# Patient Record
Sex: Male | Born: 1940 | Race: White | Hispanic: No | Marital: Single | Smoking: Never smoker
Health system: Southern US, Community
[De-identification: ages and names within clinical notes are randomized; demographics above are authoritative.]

## PROBLEM LIST (undated history)

## (undated) DIAGNOSIS — E785 Hyperlipidemia, unspecified: Secondary | ICD-10-CM

## (undated) DIAGNOSIS — G5600 Carpal tunnel syndrome, unspecified upper limb: Secondary | ICD-10-CM

## (undated) DIAGNOSIS — Z9889 Other specified postprocedural states: Secondary | ICD-10-CM

## (undated) DIAGNOSIS — G119 Hereditary ataxia, unspecified: Secondary | ICD-10-CM

## (undated) DIAGNOSIS — G4733 Obstructive sleep apnea (adult) (pediatric): Secondary | ICD-10-CM

## (undated) HISTORY — DX: Hereditary ataxia, unspecified: G11.9

## (undated) HISTORY — PX: BYPASS GRAFT AORTA TO AORTA: SHX6294

## (undated) HISTORY — PX: EYE SURGERY: SHX253

## (undated) HISTORY — DX: Hyperlipidemia, unspecified: E78.5

## (undated) HISTORY — PX: VASECTOMY: SHX75

## (undated) HISTORY — DX: Carpal tunnel syndrome, unspecified upper limb: G56.00

## (undated) HISTORY — DX: Obstructive sleep apnea (adult) (pediatric): G47.33

## (undated) HISTORY — PX: SHOULDER SURGERY: SHX246

## (undated) HISTORY — DX: Other specified postprocedural states: Z98.890

## (undated) HISTORY — PX: CARPAL TUNNEL RELEASE: SHX101

## (undated) HISTORY — PX: TONSILLECTOMY: SHX5217

---

## 2003-06-30 ENCOUNTER — Encounter: Admission: RE | Admit: 2003-06-30 | Discharge: 2003-06-30 | Payer: Self-pay | Admitting: Family Medicine

## 2003-07-18 ENCOUNTER — Ambulatory Visit (HOSPITAL_COMMUNITY): Admission: RE | Admit: 2003-07-18 | Discharge: 2003-07-18 | Payer: Self-pay | Admitting: Orthopedic Surgery

## 2003-07-18 ENCOUNTER — Ambulatory Visit (HOSPITAL_BASED_OUTPATIENT_CLINIC_OR_DEPARTMENT_OTHER): Admission: RE | Admit: 2003-07-18 | Discharge: 2003-07-18 | Payer: Self-pay | Admitting: Orthopedic Surgery

## 2003-07-30 ENCOUNTER — Encounter: Admission: RE | Admit: 2003-07-30 | Discharge: 2003-10-28 | Payer: Self-pay | Admitting: Orthopedic Surgery

## 2003-10-29 ENCOUNTER — Encounter: Admission: RE | Admit: 2003-10-29 | Discharge: 2004-01-27 | Payer: Self-pay | Admitting: Orthopedic Surgery

## 2004-06-18 ENCOUNTER — Encounter: Admission: RE | Admit: 2004-06-18 | Discharge: 2004-07-02 | Payer: Self-pay | Admitting: Orthopedic Surgery

## 2009-06-04 ENCOUNTER — Encounter: Admission: RE | Admit: 2009-06-04 | Discharge: 2009-06-04 | Payer: Self-pay | Admitting: Chiropractic Medicine

## 2010-05-26 ENCOUNTER — Encounter
Admission: RE | Admit: 2010-05-26 | Discharge: 2010-05-27 | Payer: Self-pay | Source: Home / Self Care | Attending: Orthopedic Surgery | Admitting: Orthopedic Surgery

## 2010-05-29 ENCOUNTER — Ambulatory Visit: Payer: 59 | Attending: Orthopedic Surgery | Admitting: Physical Therapy

## 2010-05-29 DIAGNOSIS — M25619 Stiffness of unspecified shoulder, not elsewhere classified: Secondary | ICD-10-CM | POA: Insufficient documentation

## 2010-05-29 DIAGNOSIS — IMO0001 Reserved for inherently not codable concepts without codable children: Secondary | ICD-10-CM | POA: Insufficient documentation

## 2010-05-29 DIAGNOSIS — M25519 Pain in unspecified shoulder: Secondary | ICD-10-CM | POA: Insufficient documentation

## 2010-06-03 ENCOUNTER — Encounter: Payer: Self-pay | Admitting: Physical Therapy

## 2010-06-05 ENCOUNTER — Encounter: Payer: Self-pay | Admitting: Physical Therapy

## 2010-06-17 ENCOUNTER — Encounter: Payer: 59 | Admitting: Physical Therapy

## 2010-06-18 ENCOUNTER — Ambulatory Visit: Payer: 59 | Admitting: Physical Therapy

## 2010-06-24 ENCOUNTER — Ambulatory Visit: Payer: 59 | Admitting: Physical Therapy

## 2010-06-30 ENCOUNTER — Ambulatory Visit: Payer: 59 | Attending: Orthopedic Surgery | Admitting: Physical Therapy

## 2010-06-30 DIAGNOSIS — IMO0001 Reserved for inherently not codable concepts without codable children: Secondary | ICD-10-CM | POA: Insufficient documentation

## 2010-06-30 DIAGNOSIS — M25519 Pain in unspecified shoulder: Secondary | ICD-10-CM | POA: Insufficient documentation

## 2010-06-30 DIAGNOSIS — M25619 Stiffness of unspecified shoulder, not elsewhere classified: Secondary | ICD-10-CM | POA: Insufficient documentation

## 2010-07-07 ENCOUNTER — Ambulatory Visit: Payer: 59 | Admitting: Physical Therapy

## 2010-07-28 ENCOUNTER — Ambulatory Visit: Payer: 59 | Attending: Orthopedic Surgery | Admitting: Physical Therapy

## 2010-07-28 DIAGNOSIS — M25519 Pain in unspecified shoulder: Secondary | ICD-10-CM | POA: Insufficient documentation

## 2010-07-28 DIAGNOSIS — IMO0001 Reserved for inherently not codable concepts without codable children: Secondary | ICD-10-CM | POA: Insufficient documentation

## 2010-07-28 DIAGNOSIS — M25619 Stiffness of unspecified shoulder, not elsewhere classified: Secondary | ICD-10-CM | POA: Insufficient documentation

## 2010-07-29 ENCOUNTER — Ambulatory Visit: Payer: 59 | Admitting: Physical Therapy

## 2010-08-05 ENCOUNTER — Ambulatory Visit: Payer: 59 | Admitting: Physical Therapy

## 2010-08-07 ENCOUNTER — Ambulatory Visit: Payer: 59 | Admitting: Physical Therapy

## 2010-08-13 ENCOUNTER — Ambulatory Visit: Payer: 59 | Admitting: Physical Therapy

## 2010-09-12 NOTE — Op Note (Signed)
NAME:  TRON, FLYTHE                        ACCOUNT NO.:  [id number]   MEDICAL RECORD NO.:  [id number]                   PATIENT TYPE:  AMB   LOCATION:  DSC                                  FACILITY:  [facility]   PHYSICIAN:  Mila Homer. Sherlean Foot, M.D.              DATE OF BIRTH:  [birth year]   DATE OF PROCEDURE:  07/18/2003  DATE OF DISCHARGE:                                 OPERATIVE REPORT   PREOPERATIVE DIAGNOSIS:  Right shoulder massive rotator cuff tear.   POSTOPERATIVE DIAGNOSIS:  Right shoulder massive rotator cuff tear.   PROCEDURES:  Right shoulder arthroscopy, subacromial decompression, distal  clavicle resection, and mini-open rotator cuff repair.   SURGEON:  Mila Homer. Sherlean Foot, M.D.   ASSISTANT:  Jamelle Rushing, P.A.   ANESTHESIA:  General plus interscalene block.   INDICATION FOR PROCEDURE:  The patient is a 70 year old with over  10 years  of shoulder pain and an MRI showing a retracted large rotator cuff tear.  Informed consent was obtained.   DESCRIPTION OF PROCEDURE:  The patient was taken to the operating room and  laid in the supine position and administered general LMA anesthesia.  He was  then placed in the beach chair position with the right shoulder prepped and  draped.  A posterior portal was created with a #11 blade, blunt trocar, and  cannula.  Diagnostic glenohumeral arthroscopy revealed no chondromalacia in  the glenohumeral joint, a well-anchored biceps tendon, and a normal labrum.  There was also minimal synovitis.  However, looking up I could see straight  through the rotator cuff into the undersurface of the acromion.  The  infraspinatus tendon appeared to be retracted to probably within a  centimeter lateral to the posterior glenoid margin of the supraspinatus to  about 1-2 cm from the glenoid margin.  The subscapularis was intact.  The  tear started at the rotator cuff interval about 1 cm anterior to the biceps  groove.  I then redirected my camera  into the subacromial space from  posterior and created a direct lateral portal.  Through that I performed a  bursectomy and anterior lateral acromioplasty.  There was an incredibly  large spur on the acromion and in the [facility] joint.  I did a very, very  aggressive decompression, going all the way over and doing a distal clavicle  resection.  After I had released the CA ligament and further debrided bursa,  I then aborted the arthroscopic portion of the case and went to the mini-  open technique.  I extended the incision cephalad 2 cm and then placed an  Arthrex shoulder retractor in place, splitting the deltoid along the lines  of its raphe.  I then completed the far lateral bursectomy.  I then realized  that the rotator cuff had a good 2-3 cm remnant laterally and that the tear  had occurred a lot more medially than usual  and not off of the bare area of  the humerus.  I created a bony trough about 1 cm medial to where the  articular margin of the humerus started and created a nice bony bleeding  bed.  I then tagged the rotator cuff circumferentially.  I was able to do  side-to-side in the infraspinatus to supraspinatus interval.  I then placed  three 5.0 mm Biocorkscrew anchors and placed vertical  mattress sutures,  pulling it down to the bony tunnel.  I then placed two more suture anchors  anterior to this to recreate the biceps interval.  I then oversewed the  lateral flap down and got a good repair.  Basically I medialized the  attachment of the rotator cuff but then oversewed it with the lateral flap  and got good tissue.  I then palpated the acromioplasty.  It was definitely  adequate.  I then irrigated and closed with interrupted 0 and 2-0 Vicryl and  Steri-Strips.  I dressed with Adaptic, 4 x 4's, sterile ABDs, two-inch silk  tape, and a sling and swath.   COMPLICATIONS:  None.   DRAINS:  None.   ESTIMATED BLOOD LOSS:  Minimal.                                                Mila Homer. Sherlean Foot, M.D.    SDL/MEDQ  D:  07/18/2003  T:  07/19/2003  Job:  [id number]

## 2010-12-24 DIAGNOSIS — I2510 Atherosclerotic heart disease of native coronary artery without angina pectoris: Secondary | ICD-10-CM

## 2010-12-25 DIAGNOSIS — I2510 Atherosclerotic heart disease of native coronary artery without angina pectoris: Secondary | ICD-10-CM

## 2011-02-03 ENCOUNTER — Encounter: Payer: Self-pay | Admitting: Surgery

## 2011-02-04 ENCOUNTER — Ambulatory Visit (INDEPENDENT_AMBULATORY_CARE_PROVIDER_SITE_OTHER): Payer: 59 | Admitting: Physician Assistant

## 2011-02-04 DIAGNOSIS — I2510 Atherosclerotic heart disease of native coronary artery without angina pectoris: Secondary | ICD-10-CM

## 2011-02-04 DIAGNOSIS — Z951 Presence of aortocoronary bypass graft: Secondary | ICD-10-CM

## 2011-02-04 NOTE — Progress Notes (Signed)
Mr. Andre [name] returned today for scheduled followup after four-vessel coronary bypass grafting by Dr. Cornelius Moras on 12/25/2010. His postoperative course was uncomplicated. He was discharged home on 12/30/2010. Within the first week after being discharged home he developed atrial fibrillation and was readmitted to the hospital and underwent DC cardioversion which was successful in regaining normal sinus rhythm. He was discharged home on amiodarone and Lopressor but then developed bradycardia of such significance the Lopressor was discontinued and the amiodarone dose was reduced to 200 mg every other day. Since that change he is continuing to make good progress. He denies any further chest pain or shortness of breath. He has begun heart strides program which he is tolerating well. He is walking approximately 1.2 miles daily in addition to the activities a tHeart Strides. He is not requiring any pain medicine at this point.  Exam His heart is in a regular rate rhythm. His breath sounds are clear to auscultation the sternotomy incision is well healed. The sternum is stable. He has no peripheral edema.  Mr. Andre [name] is scheduled followup in the near future with [facility]. They will be managing his amiodarone long-term. I have not scheduled any further followup for Mr. Andre [name] in our office and offered to see him at any time if we can be of any further assistance with his care

## 2012-09-29 ENCOUNTER — Encounter: Payer: Self-pay | Admitting: Neurology

## 2015-02-27 ENCOUNTER — Ambulatory Visit (INDEPENDENT_AMBULATORY_CARE_PROVIDER_SITE_OTHER): Payer: Medicare HMO | Admitting: Neurology

## 2015-02-27 ENCOUNTER — Other Ambulatory Visit (INDEPENDENT_AMBULATORY_CARE_PROVIDER_SITE_OTHER): Payer: Medicare HMO

## 2015-02-27 ENCOUNTER — Encounter: Payer: Self-pay | Admitting: Neurology

## 2015-02-27 VITALS — BP 136/80 | HR 67 | Ht 71.0 in | Wt 218.0 lb

## 2015-02-27 DIAGNOSIS — M791 Myalgia, unspecified site: Secondary | ICD-10-CM

## 2015-02-27 DIAGNOSIS — Z5181 Encounter for therapeutic drug level monitoring: Secondary | ICD-10-CM | POA: Diagnosis not present

## 2015-02-27 DIAGNOSIS — M25551 Pain in right hip: Secondary | ICD-10-CM

## 2015-02-27 DIAGNOSIS — M5416 Radiculopathy, lumbar region: Secondary | ICD-10-CM

## 2015-02-27 LAB — COMPREHENSIVE METABOLIC PANEL
ALBUMIN: 4.1 g/dL (ref 3.5–5.2)
ALK PHOS: 131 U/L — AB (ref 39–117)
ALT: 22 U/L (ref 0–53)
AST: 25 U/L (ref 0–37)
BILIRUBIN TOTAL: 0.8 mg/dL (ref 0.2–1.2)
BUN: 19 mg/dL (ref 6–23)
CO2: 30 meq/L (ref 19–32)
CREATININE: 1 mg/dL (ref 0.40–1.50)
Calcium: 9.5 mg/dL (ref 8.4–10.5)
Chloride: 104 mEq/L (ref 96–112)
GFR: 77.83 mL/min (ref >60.00)
GLUCOSE: 61 mg/dL — AB (ref 70–99)
Potassium: 4 mEq/L (ref 3.5–5.1)
SODIUM: 140 meq/L (ref 135–145)
Total Protein: 7.3 g/dL (ref 6.0–8.3)

## 2015-02-27 LAB — CK: Total CK: 135 U/L (ref 7–232)

## 2015-02-27 NOTE — Progress Notes (Signed)
Note routed to Dr Leonette MostKalish.

## 2015-02-27 NOTE — Progress Notes (Signed)
Andre [name] was seen today in neurologic consultation at the request of [facility], Andre BussingMICHAEL J, MD. 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  He reports that he was diagnosed over 10 years ago by Dr. Avelina LaineBrad [name] (epilepsy[facility]  He subsequently began to follow-up [facility] [place] [place]la [name] [place], who had a grant to [employer]'s.  He apparently went to [place] to be seen every 6 months for approximately 2 years, the last time of which was in May, 2014.  The patient reports that his blood tests for SCA were inconclusive.  While I do not have details of his visit with Dr. Alessandra BevelsVaughn, I do see the patient's son and was able to get records a few years ago regarding his father's diagnosis.  Apparently, all the [facility], but the [place] records suggest that the patient was negative for SCA 1, 2, 3, 6, 7 and 8.  It does appear that the patient tried Diamox for ataxia.  The patient reports that he wanted to find a local neurologist.  The primary reason for doing that now is pain that he has been having in his right groin and right thigh.   It started 2 months ago.   It has moved to the R hip now.  This pain has made the leg feel like it is going to give out.  He describes the pain as cramping.  He never has it when seated and worse when first stands.  He cannot reproduce the pain by pushing on it.  He went to see his primary care physician and was given a prescription for Mobic, which did seem to help. He tried to go off of it and then pain came back.  He subsequently went to see orthopedic surgery, Dr. Cleophas DunkerWhitfield, and was told that his hip was fine and he should follow-up with a neurologist.  His wife states that he rested all weekend and put ice on the right hip and he did much better after that.   He didn't have to use his walker on Monday and he reports that he did some outside yard activity on Monday and by Tuesday the hip was giving out again.  He  states that he normally doesn't use a walker.  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    Neuroimaging has previously been performed.  It is not available for my review today.  He states [facility].    ALLERGIES:   Allergies  Allergen Reactions  . Vicodin [Hydrocodone-Acetaminophen]     Drug Allergies:None Known. However the patient does report that he is intolerant of Vicodin which causes severe nausea. He is very sensitive to any sort of sedatives or narcotics due to underlying spinal condition.    CURRENT MEDICATIONS:  Outpatient Encounter Prescriptions as of 02/27/2015  Medication Sig  . aspirin 81 MG tablet Take 81 mg by mouth daily.    Marland Kitchen. atorvastatin (LIPITOR) 40 MG tablet Take 40 mg by mouth at bedtime.    . Coenzyme Q10 (CO Q 10 PO) Take by mouth daily.  . meloxicam (MOBIC) 15 MG tablet Take 15 mg by mouth daily.  . [DISCONTINUED] CHONDROITIN SULFATE PO Take 1 tablet by mouth daily.    . [DISCONTINUED]  ciprofloxacin (CIPRO) 500 MG tablet Take 500 mg by mouth 2 (two) times daily. X 3 days   . [DISCONTINUED] Metoprolol Tartrate (LOPRESSOR PO) Take 25 mg by mouth 2 (two) times daily.    . [DISCONTINUED] Multiple Vitamins-Minerals (CENTRUM SILVER PO) 1 daily   . [DISCONTINUED] Omega-3 Fatty Acids (FISH OIL PO) Take 1 capsule by mouth daily.    . [DISCONTINUED] Pyridoxine HCl (VITAMIN B-6) 25 MG tablet Take 25 mg by mouth daily.    . [DISCONTINUED] traMADol (ULTRAM) 50 MG tablet 1 or 2 every 4 hours as needed for pain    No facility-administered encounter medications on file as of 02/27/2015.    PAST MEDICAL HISTORY:   Past Medical History  Diagnosis Date  . Cerebellar ataxia (HCC)     spinal cerebellar ataxia  . Obstructive sleep apnea   . Dyslipidemia     PAST SURGICAL HISTORY:   Past  Surgical History  Procedure Laterality Date  . Shoulder surgery      bilateral  . Vasectomy    . Tonsillectomy    . Eye surgery Bilateral   . Bypass graft aorta to aorta      quadruple bypass  . Carpal tunnel release Left     SOCIAL HISTORY:   Social History   Social History  . Marital Status: Single    Spouse Name: N/A  . Number of Children: N/A  . Years of Education: N/A   Occupational History  . Not on file.   Social History Main Topics  . Smoking status: Never Smoker   . Smokeless tobacco: Not on file  . Alcohol Use: No  . Drug Use: No  . Sexual Activity: Not on file   Other Topics Concern  . Not on file   Social History Narrative    FAMILY HISTORY:   Family Status  Relation Status Death Age  . Mother Deceased     SCA, died from fall  . Father Deceased     heart failure  . Sister Alive     stroke  . Sister Alive     healthy    ROS:  A complete 10 system review of systems was obtained and was unremarkable apart from what is mentioned above.  PHYSICAL EXAMINATION:    VITALS:   Filed Vitals:   02/27/15 1033  BP: 136/80  Pulse: 67  Height:  (1.803 m)  Weight: 218 lb (98.884 kg)    GEN:  Normal appears male in no acute distress.  Appears stated age. HEENT:  Normocephalic, atraumatic. The mucous membranes are moist. The superficial temporal arteries are without ropiness or tenderness. Cardiovascular: Regular rate and rhythm. Lungs: Clear to auscultation bilaterally. Neck/Heme: There are no carotid bruits noted bilaterally.  NEUROLOGICAL: Orientation:  The patient is alert and oriented x 3.  Fund of knowledge is appropriate.  Recent and remote memory intact.  Attention span and concentration normal.  Repeats and names without difficulty. Cranial nerves: There is good facial symmetry. The pupils are equal round and reactive to light bilaterally. Fundoscopic exam reveals clear disc margins bilaterally. Extraocular muscles are intact and visual  fields are full to confrontational testing. Mild difficulty with smooth pursuit.  Speech is fluent and clear. Soft palate rises symmetrically and there is no tongue deviation. Hearing is intact to conversational tone. Tone: Tone is good throughout. Sensation: Sensation is intact to light touch and pinprick throughout (facial, trunk, extremities).  Pinprick is symmetric throughout.  Vibration is decreased at the bilateral  big toe, but it is much more decreased in the right lower extremity than the left. There is no extinction with double simultaneous stimulation. There is no sensory dermatomal level identified. Coordination:  The patient has no difficulty with RAM's or FNF bilaterally. Motor: Strength is 5/5 in the bilateral upper and lower extremities.  Shoulder shrug is equal and symmetric. There is no pronator drift.  There are no fasciculations noted. DTR's: Deep tendon reflexes are 2-/4 at the bilateral biceps, triceps, brachioradialis, patella and absent at the bilateral achilles.  Plantar responses are downgoing bilaterally. Gait and Station: The patient is able to get out of the chair without the use of the hands.  Once he starts walking, he has a very antalgic gait and reports that he is having hip pain.  He drags the right leg somewhat.   IMPRESSION/PLAN  1.  Right hip and right lower extremity pain, with sensation that R leg is giving out  -Seems more mechanical in nature, especially given the fact that rest and ice helps it.  However, he has seen ortho and had x-rays and told that the hip was fine.  I asked pt if his ortho thought that it could be bursitis, but he states that they did not talk about that but he was just told that it was not an orthopedic issue.  -I told the patient that we would go ahead and do an EMG to look at nerve function, but also to rule out myopathy given that he is on a statin.  I think this is rather low on the differential, but it could be.  He will have lab work  in that regard as well, including CPK, aldolase, chemistry, sedimentation rate  -We will do an MRI of the lumbar spine and right hip.  -If the above are negative, I told him we could do an MRI of the brain but I think the yield of that is really quite low.  I told him that it would be very unusual for any type of brain lesion to cause hip and leg pain, although it could cause weakness.  However, he was not weak on his examination. 2.  ? SCA  -I really did not see evidence of that on examination.  Did not ever need walker until he had recent leg/hip issue.  Has been w/u extensively and w/u negative thus far. 3.  Idiopathic peripheral neuropathy   -states that was w/u in [facility] as well and has been present for years. Could contribute to gait instability but not hip pain/RLE pain. 4.  Further recommendations will follow the above testing.

## 2015-02-27 NOTE — Patient Instructions (Signed)
1. We will call you with an appt for your EMG.  2. Your provider has requested that you have labwork completed today. Please go to [facility] on the second floor of this building before leaving the office today. You do not need to check in. If you are not called within 15 minutes please check with the front desk.  3. We have [facility] on 03/13/2015 at 2:00 pm. Please arrive 15 minutes prior and go to 1st floor radiology. If you need to reschedule for any reason please call [phone].

## 2015-02-28 ENCOUNTER — Telehealth: Payer: Self-pay | Admitting: Neurology

## 2015-02-28 LAB — SEDIMENTATION RATE: Sed Rate: 11 mm/hr (ref 0–22)

## 2015-02-28 NOTE — Telephone Encounter (Signed)
Lab results forwarded to Dr Leonette MostKalish. Patient made aware of results and to follow up with PCP.

## 2015-02-28 NOTE — Telephone Encounter (Signed)
-----   Message from Octaviano Battyebecca S Tat, DO sent at 02/28/2015 10:38 AM EDT ----- Let pt know that his BS was a little low yesterday when we checked it.  Should f/u with PCP.  Make sure that they have copy

## 2015-03-05 ENCOUNTER — Ambulatory Visit (INDEPENDENT_AMBULATORY_CARE_PROVIDER_SITE_OTHER): Payer: Medicare HMO | Admitting: Neurology

## 2015-03-05 DIAGNOSIS — M25551 Pain in right hip: Secondary | ICD-10-CM

## 2015-03-05 DIAGNOSIS — M791 Myalgia, unspecified site: Secondary | ICD-10-CM

## 2015-03-05 DIAGNOSIS — G609 Hereditary and idiopathic neuropathy, unspecified: Secondary | ICD-10-CM

## 2015-03-05 DIAGNOSIS — M5416 Radiculopathy, lumbar region: Secondary | ICD-10-CM

## 2015-03-05 LAB — ALDOLASE: Aldolase: 5.7 U/L (ref <=8.1)

## 2015-03-05 NOTE — Procedures (Signed)
[facility]  [address] [place], [address]  [place], [place] [address] Tel: [phone] Fax:  [phone] Test Date:  03/05/2015  Patient: Andre [name] DOB: 08-01-1940 Physician: Nita Sickle  Sex: Male Height:  Ref Phys: Kerin Salen  ID#: [id number] Temp: 32.3C Technician: Judie Petit. Dean   Patient Complaints: This is a 74 year old gentleman referred for evaluation of right hip pain and weakness.  NCV & EMG Findings: Extensive electrodiagnostic testing of the right lower extremity and additional studies of the left shows: 1. Bilateral sural and the right superficial peroneal sensory responses are absent. 2. Left peroneal motor response is absent recording at the extensor digitorum brevis. Right peroneal motor response recording at the extensor digitorum brevis is reduced in amplitude with slowed conduction velocity. Bilateral tibial motor responses are reduced in amplitude and also show conduction velocity slowing. Bilateral peroneal motor responses recording at the tibialis anterior is within normal limits. 3. Right tibial H reflex study is prolonged. 4. Chronic motor axon loss changes are seen affecting all of the tested muscles of the lower extremities bilaterally, with isolated active changes in the right gastrocnemius muscle.  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    A superimposed multilevel intraspinal canal lesion (i.e. radiculopathy) affecting the L3-S1 nerve roots/segments is cannot be excluded.  Imaging of the lumbosacral spine is recommended.  There is no evidence of a diffuse myopathy.   _____________________________ Nita Sickle, D.O.    Nerve Conduction Studies Anti Sensory Summary Table   Stim Site NR Peak (ms) Norm Peak (ms) P-T Amp (V) Norm P-T Amp  Right Sup Peroneal Anti Sensory (Ant Lat Mall)  12 cm  NR  <4.6  >3  Left Sural Anti Sensory (Lat Mall)  Calf NR  <4.6  >3  Right Sural Anti Sensory (Lat Mall)  Calf NR  <4.6  >3   Motor Summary Table   Stim Site NR Onset (ms) Norm Onset (ms) O-P Amp (mV) Norm O-P Amp Site1 Site2 Delta-0 (ms) Dist (cm) Vel (m/s) Norm Vel (m/s)  Left Peroneal Motor (Ext Dig Brev)  Ankle NR  <6.0  >2.5 B Fib Ankle  0.0  >40  B Fib NR     Poplt B Fib  0.0  >40  Poplt NR            Right Peroneal Motor (Ext Dig Brev)  Ankle    5.9 <6.0 1.5 >2.5 B Fib Ankle 10.1 33.0 33 >40  B Fib    16.0  1.3  Poplt B Fib 2.5 10.0 40 >40  Poplt    18.5  1.4         Left Peroneal TA Motor (Tib Ant)  Fib Head    3.7 <4.5 3.0 >3 Poplit Fib Head 2.0 10.0 50 >40  Poplit    5.7  2.9         Right Peroneal TA Motor (Tib Ant)  Fib Head    3.2 <4.5 3.7 >3 Poplit Fib Head 1.9 10.0 53 >40  Poplit    5.1  3.2         Left Tibial Motor (Abd Hall Brev)  Ankle    5.0 <6.0 1.2 >4 Knee Ankle 13.9 44.0 32 >40  Knee    18.9  0.5         Right Tibial Motor (Abd Hall Brev)  Ankle    5.6 <6.0 1.6 >4  Knee Ankle 12.4 44.0 35 >40  Knee    18.0  1.5          H Reflex Studies   NR H-Lat (ms) Lat Norm (ms) L-R H-Lat (ms)  Right Tibial (Gastroc)     37.28 <35    EMG   Side Muscle Ins Act Fibs Psw Fasc Number Recrt Dur Dur. Amp Amp. Poly Poly. Comment  Right AntTibialis Nml Nml Nml Nml 1- Rapid Some 1+ Some 1+ Nml Nml N/A  Right RectFemoris Nml Nml Nml Nml 1- Rapid Some 1+ Some 1+ Nml Nml N/A  Right BicepsFemS Nml Nml Nml Nml 1- Mod-R Few 1+ Few 1+ Nml Nml N/A  Right GluteusMed Nml Nml Nml Nml 1- Rapid Some 1+ Some 1+ Nml Nml N/A  Right Lumbo Parasp Low Nml Nml Nml Nml Nml Nml Nml Nml Nml Nml Nml Nml N/A  Right Gastroc Nml Nml 1+ Nml 2- Rapid Some 1+ Some 1+ Nml Nml N/A  Right Flex Dig Long Nml Nml Nml Nml 2- Rapid Many 1+ Some 1+ Nml Nml N/A  Right AdductorLong Nml Nml Nml Nml 1- Mod-R Few 1+ Few 1+ Nml Nml N/A  Left AntTibialis Nml Nml Nml Nml 1- Rapid Some 1+ Some 1+ Nml Nml N/A  Left  Flex Dig Long Nml Nml Nml Nml 2- Rapid Many 1+ Some 1+ Nml Nml N/A  Left RectFemoris Nml Nml Nml Nml 1- Rapid Some 1+ Some 1+ Nml Nml N/A  Left Gastroc Nml Nml Nml Nml 2- Rapid Some 1+ Some 1+ Nml Nml N/A      Waveforms:

## 2015-03-06 ENCOUNTER — Telehealth: Payer: Self-pay | Admitting: Neurology

## 2015-03-06 NOTE — Telephone Encounter (Signed)
-----   Message from Octaviano Battyebecca S Tat, DO sent at 03/05/2015  5:08 PM EST ----- Lesly RubensteinJade, you can give pt results of the EMG.  We will await MRI.  However, Dr. Allena KatzPatel agreed with me that she thought that clinically this was more of a hip issue but we will look neurologically

## 2015-03-06 NOTE — Telephone Encounter (Signed)
Patient/wife made aware of results.  

## 2015-03-13 ENCOUNTER — Ambulatory Visit (HOSPITAL_COMMUNITY)
Admission: RE | Admit: 2015-03-13 | Discharge: 2015-03-13 | Disposition: A | Discharge status: Home / Self Care | Payer: Medicare HMO | Source: Ambulatory Visit | Attending: Neurology | Admitting: Neurology

## 2015-03-13 DIAGNOSIS — M47896 Other spondylosis, lumbar region: Secondary | ICD-10-CM | POA: Diagnosis not present

## 2015-03-13 DIAGNOSIS — M25551 Pain in right hip: Secondary | ICD-10-CM

## 2015-03-13 DIAGNOSIS — M5416 Radiculopathy, lumbar region: Secondary | ICD-10-CM

## 2015-03-13 DIAGNOSIS — M7061 Trochanteric bursitis, right hip: Secondary | ICD-10-CM | POA: Insufficient documentation

## 2015-03-14 ENCOUNTER — Telehealth: Payer: Self-pay | Admitting: Neurology

## 2015-03-14 NOTE — Telephone Encounter (Signed)
PT returned your call/Dawn CB# [phone]

## 2015-03-14 NOTE — Telephone Encounter (Signed)
Tell pt that MRI hip showed bursitis and that may be cause of some of pain. 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 Send copy to pts ortho surgeon

## 2015-03-14 NOTE — Telephone Encounter (Signed)
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  Okay with neurosx referral?

## 2015-03-14 NOTE — Telephone Encounter (Signed)
Patient and wife made aware of results. He would like to have MR's forwarded to Dr Cleophas DunkerWhitfield and will get his opinion- not interested in neurosurgery referral. MR's forwarded to Dr Cleophas DunkerWhitfield and copies mailed to patient per his request.

## 2015-03-14 NOTE — Telephone Encounter (Signed)
Left message on machine for patient to call back.

## 2015-05-09 DIAGNOSIS — I4892 Unspecified atrial flutter: Secondary | ICD-10-CM | POA: Insufficient documentation

## 2015-05-09 DIAGNOSIS — G4733 Obstructive sleep apnea (adult) (pediatric): Secondary | ICD-10-CM | POA: Insufficient documentation

## 2015-05-09 DIAGNOSIS — Z951 Presence of aortocoronary bypass graft: Secondary | ICD-10-CM | POA: Insufficient documentation

## 2015-05-09 DIAGNOSIS — E785 Hyperlipidemia, unspecified: Secondary | ICD-10-CM | POA: Insufficient documentation

## 2015-07-30 DIAGNOSIS — K439 Ventral hernia without obstruction or gangrene: Secondary | ICD-10-CM | POA: Insufficient documentation

## 2015-09-26 DIAGNOSIS — H2510 Age-related nuclear cataract, unspecified eye: Secondary | ICD-10-CM | POA: Insufficient documentation

## 2015-09-26 DIAGNOSIS — H5000 Unspecified esotropia: Secondary | ICD-10-CM | POA: Insufficient documentation

## 2015-09-26 DIAGNOSIS — H52229 Regular astigmatism, unspecified eye: Secondary | ICD-10-CM | POA: Insufficient documentation

## 2015-09-26 DIAGNOSIS — H04123 Dry eye syndrome of bilateral lacrimal glands: Secondary | ICD-10-CM | POA: Insufficient documentation

## 2015-09-26 DIAGNOSIS — H5203 Hypermetropia, bilateral: Secondary | ICD-10-CM | POA: Insufficient documentation

## 2015-09-26 DIAGNOSIS — H11041 Peripheral pterygium, stationary, right eye: Secondary | ICD-10-CM | POA: Insufficient documentation

## 2015-09-26 DIAGNOSIS — H5200 Hypermetropia, unspecified eye: Secondary | ICD-10-CM | POA: Insufficient documentation

## 2015-09-26 DIAGNOSIS — H524 Presbyopia: Secondary | ICD-10-CM | POA: Insufficient documentation

## 2015-10-09 DIAGNOSIS — I493 Ventricular premature depolarization: Secondary | ICD-10-CM | POA: Insufficient documentation

## 2016-04-28 DIAGNOSIS — G5601 Carpal tunnel syndrome, right upper limb: Secondary | ICD-10-CM | POA: Insufficient documentation

## 2016-04-28 DIAGNOSIS — G5621 Lesion of ulnar nerve, right upper limb: Secondary | ICD-10-CM | POA: Insufficient documentation

## 2017-03-09 DIAGNOSIS — I252 Old myocardial infarction: Secondary | ICD-10-CM | POA: Insufficient documentation

## 2017-03-09 DIAGNOSIS — I2510 Atherosclerotic heart disease of native coronary artery without angina pectoris: Secondary | ICD-10-CM | POA: Insufficient documentation

## 2017-04-07 DIAGNOSIS — E559 Vitamin D deficiency, unspecified: Secondary | ICD-10-CM | POA: Insufficient documentation

## 2017-04-07 DIAGNOSIS — M8589 Other specified disorders of bone density and structure, multiple sites: Secondary | ICD-10-CM | POA: Insufficient documentation

## 2017-04-07 DIAGNOSIS — R209 Unspecified disturbances of skin sensation: Secondary | ICD-10-CM | POA: Insufficient documentation

## 2018-08-29 ENCOUNTER — Telehealth: Payer: Self-pay | Admitting: Neurology

## 2018-08-29 NOTE — Telephone Encounter (Signed)
Andre [name],   I see that this patient is on schedule.  I last saw him 4 years ago for a number of things.  He has very severe PN.  May be best for Dr. Allena Katz but can you find out what he needs seen for now (back pain, neuropathy, etc)

## 2018-09-13 ENCOUNTER — Ambulatory Visit: Payer: Medicare HMO | Admitting: Neurology

## 2018-10-20 DIAGNOSIS — H43319 Vitreous membranes and strands, unspecified eye: Secondary | ICD-10-CM | POA: Insufficient documentation

## 2018-10-26 ENCOUNTER — Telehealth: Payer: Self-pay | Admitting: Neurology

## 2018-10-26 NOTE — Telephone Encounter (Signed)
Patient is wanting to give an update before appt. He said he would mail the info,  If we don't rec it before appt please call him to get update info. Thanks!

## 2018-10-27 NOTE — Telephone Encounter (Signed)
Pt states that he has a list of things that has happened to him over the past year or so and he will bring them in to the office at his visit

## 2018-11-02 ENCOUNTER — Ambulatory Visit: Payer: Medicare HMO | Admitting: Neurology

## 2018-11-04 ENCOUNTER — Ambulatory Visit: Payer: Medicare HMO | Admitting: Neurology

## 2018-12-12 ENCOUNTER — Ambulatory Visit (INDEPENDENT_AMBULATORY_CARE_PROVIDER_SITE_OTHER): Payer: Medicare HMO | Admitting: Neurology

## 2018-12-12 ENCOUNTER — Other Ambulatory Visit: Payer: Self-pay

## 2018-12-12 ENCOUNTER — Other Ambulatory Visit (INDEPENDENT_AMBULATORY_CARE_PROVIDER_SITE_OTHER): Payer: Medicare HMO

## 2018-12-12 ENCOUNTER — Encounter: Payer: Self-pay | Admitting: Neurology

## 2018-12-12 VITALS — BP 138/82 | HR 54 | Ht 71.0 in | Wt 205.0 lb

## 2018-12-12 DIAGNOSIS — G118 Other hereditary ataxias: Secondary | ICD-10-CM

## 2018-12-12 DIAGNOSIS — G629 Polyneuropathy, unspecified: Secondary | ICD-10-CM | POA: Insufficient documentation

## 2018-12-12 DIAGNOSIS — G119 Hereditary ataxia, unspecified: Secondary | ICD-10-CM

## 2018-12-12 NOTE — Patient Instructions (Addendum)
Check labs.  We will call you with the results Your provider has requested that you have labwork completed today. Please go to [facility] ([address]) on the second floor of this building before leaving the office today. You do not need to check in. If you are not called within 15 minutes please check with the front desk. If your symptoms of spinocerebellar ataxia progress, please call the office to schedule visit with Dr. Carles Collet

## 2018-12-12 NOTE — Progress Notes (Signed)
[facility] Clinic Note - Initial Visit   Date: 12/12/18  Andre [name] MRN: [id number] DOB: [birth year]   Dear Dr. Jeralene Huff:  Thank you for your kind referral of Andre [name] for consultation of imbalance. Although his history is well known to you, please allow [name] to reiterate it for the purpose of our medical record. The patient was accompanied to the clinic by self.   History of Present Illness: Andre [name] is a 78 y.o. 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    Starting around 2010, he began having numbness and cold sensation in the feet.  Initially, he was wearing socks at nighttime to help alleviate the cold sensation.  Over time, symptoms progressed into numbness over the balls of the feet and in between his toes.  Over the past 3 years, he has noticed numbness over the top of the toes and very mildly into the dorsum of the feet.  He does not have any numbness or tingling at the ankle or into the lower leg.  He has tried cold therapy through chiropractic clinic which helped his lumbar pain, no change in his neuropathy.  He was then recommended to try vitamin B6 which he has been taking 400 mg daily since December 2018.  Within a month, he felt increased sensation under his feet and as the year went on, felt that his numbness was significantly improving especially over the soles of the feet.  Currently, numbness predominantly involves the toes only.  He has a long history of imbalance which started in his early 65s.  He has been using a cane for the past 5 years.  He has fallen 4 times, and fortunately not hurt himself.  He is able to stand up from his falls without needing assistance.  He has seen a number of neurologists in the past including at [facility], and most recently with Dr. Carles Collet here at [facility] neurology.  Prior evaluation was concerning for cerebellar ataxia, however  genetic testing was negative.  His last MRI brain was performed in 2014 and reportedly normal.  I do not have any of his prior testing to review. He has noticed mild double vision, slurred speech, and trace difficulty with swallow.  Out-side paper records, electronic medical record, and images have been reviewed where available and summarized as:  NCS/EMG of the legs 03/05/2015: The electrophysiologic findings are most consistent with a severe generalized sensorimotor polyneuropathy, predominantly axon loss in type, affecting the lower extremities.    A superimposed multilevel intraspinal canal lesion (i.e. radiculopathy) affecting the L3-S1 nerve roots/segments is cannot be excluded.  Imaging of the lumbosacral spine is recommended.  There is no evidence of a diffuse myopathy.  MRI lumbar spine wo contrast 03/13/2015:  Spondylosis appearing worst at L3-4 where there is moderately severe central canal and mild bilateral foraminal narrowing.   Past Medical History:  Diagnosis Date  . Carpal tunnel syndrome   . Cerebellar ataxia (HCC)    spinal cerebellar ataxia  . Dyslipidemia   . H/O rotator cuff surgery   . Obstructive sleep apnea     Past Surgical History:  Procedure Laterality Date  . BYPASS GRAFT AORTA TO AORTA     quadruple bypass  . CARPAL TUNNEL RELEASE Left   . EYE SURGERY Bilateral   . SHOULDER SURGERY     bilateral  . TONSILLECTOMY    . VASECTOMY       Medications:  Outpatient Encounter Medications as of 12/12/2018  Medication Sig  . aspirin 81 MG tablet Take 81 mg by mouth daily.    Marland Kitchen. atorvastatin (LIPITOR) 40 MG tablet Take 40 mg by mouth at bedtime.    . Cholecalciferol (VITAMIN D) 125 MCG (5000 UT) CAPS Take by mouth.  . Coenzyme Q10 (CO Q 10 PO) Take by mouth daily.  . meloxicam (MOBIC) 15 MG tablet Take 15 mg by mouth daily.  Marland Kitchen. aspirin 81 MG chewable tablet Chew by mouth.   No facility-administered encounter medications on file as of 12/12/2018.      Allergies:  Allergies  Allergen Reactions  . Vicodin [Hydrocodone-Acetaminophen]     Drug Allergies:None Known. However the patient does report that he is intolerant of Vicodin which causes severe nausea. He is very sensitive to any sort of sedatives or narcotics due to underlying spinal condition.  . Hydrocodone-Acetaminophen Nausea Only    Drug Allergies:None Known. However the patient does report that he is intolerant of Vicodin which causes severe nausea. He is very sensitive to any sort of sedatives or narcotics due to underlying spinal condition. Drug Allergies:None Known. However the patient does report that he is intolerant of Vicodin which causes severe nausea. He is very sensitive to any sort of sedatives or narcotics due to underlying spinal condition. Drug Allergies:None Known. However the patient does report that he is intolerant of Vicodin which causes severe nausea. He is very sensitive to any sort of sedatives or narcotics due to underlying spinal condition.     Family History: Family History  Problem Relation Age of Onset  . Heart failure Father   . Seizures Son     Social History: Social History   Tobacco Use  . Smoking status: Never Smoker  . Smokeless tobacco: Never Used  Substance Use Topics  . Alcohol use: No    Alcohol/week: 0.0 standard drinks  . Drug use: No   Social History   Social History Narrative   Two story home    Right handed   4 years of college   2 sons   Wife lives with him and son     Review of Systems:  CONSTITUTIONAL: No fevers, chills, night sweats, or weight loss.   EYES: No visual changes or eye pain +double vision ENT: No hearing changes.  No history of nose bleeds.   RESPIRATORY: No cough, wheezing and shortness of breath.   CARDIOVASCULAR: Negative for chest pain, and palpitations.   GI: Negative for abdominal discomfort, blood in stools or black stools.  No recent change in bowel habits.   GU:  No history of incontinence.    MUSCLOSKELETAL: No history of joint pain or swelling.  No myalgias.   SKIN: Negative for lesions, rash, and itching.   HEMATOLOGY/ONCOLOGY: Negative for prolonged bleeding, bruising easily, and swollen nodes.  No history of cancer.   ENDOCRINE: Negative for cold or heat intolerance, polydipsia or goiter.   PSYCH:  No depression or anxiety symptoms.   NEURO: As Above.   Vital Signs:  BP 138/82   Pulse (!) 54   Ht 5\' 11"  (1.803 m)   Wt 205 lb (93 kg)   SpO2 98%   BMI 28.59 kg/m    General Medical Exam:   General:  Well appearing, comfortable.   Eyes/ENT: see cranial nerve examination.   Neck:   No carotid bruits. Respiratory:  Clear to auscultation, good air entry bilaterally.   Cardiac:  Regular rate and rhythm, no murmur.  Extremities:  No deformities, edema, or skin discoloration.  Skin:  No rashes or lesions.  Neurological Exam: MENTAL STATUS including orientation to time, place, person, recent and remote memory, attention span and concentration, language, and fund of knowledge is normal.  Speech is mildly dysarthric, especially with gutteral sounds.  CRANIAL NERVES: II:  No visual field defects.     III-IV-VI: Pupils equal round and reactive to light.  Conjugate eyes with incomplete gaze in all directions as well as slow nystagmus at end gaze in all directions, nonfatigable.  Jerky saccadic movement. No ptosis.   V:  Normal facial sensation.    VII:  Normal facial symmetry and movements.   VIII:  Normal hearing and vestibular function.   IX-X:  Normal palatal movement.   XI:  Normal shoulder shrug and head rotation.   XII:  Normal tongue strength and range of motion, no deviation or fasciculation.  MOTOR:  No atrophy, fasciculations or abnormal movements.  No pronator drift.   Upper Extremity:  Right  Left  Deltoid  5/5   5/5   Biceps  5/5   5/5   Triceps  5/5   5/5   Infraspinatus 5/5  5/5  Medial pectoralis 5/5  5/5  Wrist extensors  5/5   5/5   Wrist flexors   5/5   5/5   Finger extensors  5/5   5/5   Finger flexors  5/5   5/5   Dorsal interossei  5/5   5/5   Abductor pollicis  5/5   5/5   Tone (Ashworth scale)  0  0   Lower Extremity:  Right  Left  Hip flexors  5/5   5/5   Hip extensors  5/5   5/5   Adductor 5/5  5/5  Abductor 5/5  5/5  Knee flexors  5/5   5/5   Knee extensors  5/5   5/5   Dorsiflexors  5/5   5/5   Plantarflexors  5/5   5/5   Toe extensors  5/5   5/5   Toe flexors  5/5   5/5   Tone (Ashworth scale)  0  0   MSRs:  Right        Left                  brachioradialis 2+  2+  biceps 2+  2+  triceps 2+  2+  patellar 2+  2+  ankle jerk 1+  1+  Hoffman no  no  plantar response down  down   SENSORY: Vibration is 100% at the knee, 75% at the ankle and absent at the great toe bilaterally.  Pinprick and temperature is intact throughout.Marland Kitchen.  Rhomberg testing was not checked, as he is already moderately ataxic with standing alone  COORDINATION/GAIT: Dysmetria bilaterally with finger-to- nose-finger and heel-to-shin, worse in the legs.  There is over issues with finger movements.  Intact rapid alternating movements bilaterally.  Able to rise from a chair without using arms, but has to steady himself upon standing.  Gait severely ataxic, assisted, almost falling on one occasion, where I had to help support him against the exam table.   IMPRESSION: 1. 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  He has signs and symptoms consistent with SCA, however, genetic testing has not confirmed this.  His exam shows non-fatigable nystagmus in all directions, mild spastic dysarthria, dysmetria (worse in the legs), and severe ataxic gait.  He was previously evaluated by Dr. Arbutus Leasat in  2016 and has many questions regarding additional work-up or treatment strategies.  I have encouraged him to follow-up with Dr. Arbutus Leasat for cerebellar ataxia.  2. Although there is evidence of neuropathy on exam, these findings are clinically mild and restricted to the distal  feet.  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  His gait is out of proportion to what would be expected for a peripheral neuropathy.   He claims to have improvement with respect to neuropathy after taking vitamin B6 400 mg daily.  I explained to patient that I am concerned about vitamin B6 toxicity at such a high dose, which can lead to irreversible damage to the dorsal root ganglion, only further the worsening ataxia.  To look for secondary causes, I will check TSH, vitamin B12, vitamin B1, vitamin B6, folate, SPEP with IFE, copper. I am happy to see him for issues related to his neuropathy as needed.    Greater than 50% of this 50 minute visit was spent in counseling, explanation of diagnosis, planning of further management, and coordination of care.   Thank you for allowing me to participate in patient's care.  If I can answer any additional questions, I would be pleased to do so.    Sincerely,    Evarose Altland K. Allena KatzPatel, DO

## 2018-12-13 ENCOUNTER — Telehealth: Payer: Self-pay | Admitting: Neurology

## 2018-12-13 NOTE — Telephone Encounter (Signed)
Reviewed Dr. Serita Grit records, and spoke with her about the patient, and she also gave me a long list from the patient of his concerns.  He wanted to know if there had been any recent diagnostic updates that may give him a clear picture of his disease and perhaps new medications.  Because I was not able to get complete records of his, we likely would need to do a complete ataxia panel on him.  I have not seen him for many years.  If he would like to make a new patient appointment with me, we can do that.

## 2018-12-14 NOTE — Telephone Encounter (Signed)
Patient is schedule to see you in Oct

## 2018-12-15 LAB — PROTEIN ELECTROPHORESIS, SERUM
Albumin ELP: 4 g/dL (ref 3.8–4.8)
Alpha 1: 0.3 g/dL (ref 0.2–0.3)
Alpha 2: 0.7 g/dL (ref 0.5–0.9)
Beta 2: 0.5 g/dL (ref 0.2–0.5)
Beta Globulin: 0.5 g/dL (ref 0.4–0.6)
Gamma Globulin: 1.2 g/dL (ref 0.8–1.7)
Total Protein: 7.1 g/dL (ref 6.1–8.1)

## 2018-12-15 LAB — TSH: TSH: 1.56 mIU/L (ref 0.40–4.50)

## 2018-12-15 LAB — COPPER, SERUM: Copper: 112 ug/dL (ref 70–175)

## 2018-12-15 LAB — IMMUNOFIXATION ELECTROPHORESIS
IgG (Immunoglobin G), Serum: 1191 mg/dL (ref 600–1540)
IgM, Serum: 89 mg/dL (ref 50–300)
Immunofix Electr Int: NOT DETECTED
Immunoglobulin A: 380 mg/dL — ABNORMAL HIGH (ref 70–320)

## 2018-12-15 LAB — B12 AND FOLATE PANEL
Folate: 11.3 ng/mL
Vitamin B-12: 417 pg/mL (ref 200–1100)

## 2018-12-15 LAB — VITAMIN B6: Vitamin B6: 99.9 ng/mL — ABNORMAL HIGH (ref 2.1–21.7)

## 2018-12-15 LAB — VITAMIN B1: Vitamin B1 (Thiamine): 8 nmol/L (ref 8–30)

## 2018-12-16 ENCOUNTER — Telehealth: Payer: Self-pay

## 2018-12-16 NOTE — Telephone Encounter (Signed)
Patient notified of labs.   

## 2018-12-16 NOTE — Telephone Encounter (Signed)
-----   Message from Pieter Partridge, DO sent at 12/15/2018  7:29 PM EDT ----- The patient's vitamin B6 level is high.  I recommend that he discontinue supplemental B6.  Otherwise, labs are unremarkable.

## 2018-12-16 NOTE — Telephone Encounter (Signed)
No asnwer at 836

## 2018-12-16 NOTE — Telephone Encounter (Signed)
-----   Message from Adam R Jaffe, DO sent at 12/15/2018  7:29 PM EDT ----- The patient's vitamin B6 level is high.  I recommend that he discontinue supplemental B6.  Otherwise, labs are unremarkable. 

## 2019-01-24 NOTE — Progress Notes (Signed)
Andre [name] was seen today in neurologic consultation at the request of Alda Berthold, DO.  The consultation is for the evaluation of ataxia.  I saw the patient about 4 years ago for similar.  He had been worked up at [facility] in the early 2000 by Dr. Hilarie Fredrickson and then followed up with Dr. Noreene Larsson at the [place], who apparently had a grant to study SCA'S.  Patient reported that all of his ataxia evaluation was unremarkable.  His [place] records had suggested that the patient was negative for SCA types 1, 2, 3, 6, 7, 8.  It also noted that the patient had tried Diamox for ataxia.  More recently, in August, 2020, the patient saw Dr. Posey Pronto for neuropathy.  His EMG demonstrated a severe generalized sensorimotor peripheral neuropathy.  However, his gait findings were out of proportion for just peripheral neuropathy, so she did send him back here for evaluation.  She did tell him to stop the B6, as it could be toxic to the dorsal root ganglion, further worsening his gait troubles.  His B6 level was also high when she checked it.  He states that he reduced his dose from 200 mg/day to 50 mg/day, but he noticed that he had more feet paresthesias that he had previously not experienced since May and he was very "disappointed and depressed."  Therefore, he stated that he resumed taking 100 mg of B6 per day and when he woke up the very next morning, he noticed a "significant reduction in the numbness and tingling in the bottom of my feet and toes."  His B12 was normal at 417.  Copper was normal at 112.  When asked if he has gotten worse he initially states "I haven't gotten better" but then he does state that balance has gotten a little worse.  Had to quit playing golf 2 years ago.  Trouble getting in the sand trap to get the ball.  Wife thinks that balance getting worse.  Lack of sleep makes things worse.  6-7 falls in the last year stating that he would trip over things he didn't see.  Doesn't think  swallowing is worse but "I cough a lot and swallowing is always somewhat of an issue but not worse unless I eat crackers."  "My speech is slurred when I am tired but its not worse."  No urinary incontinence.  Has diplopia since the age of 57.  Has prisms in the glasses.  No cramping in the feet or legs.  No trouble with driving.  Does balance exercises and "that hasn't changed for years - I can still do them."    ALLERGIES:   Allergies  Allergen Reactions  . Vicodin [Hydrocodone-Acetaminophen]     Drug Allergies:None Known. However the patient does report that he is intolerant of Vicodin which causes severe nausea. He is very sensitive to any sort of sedatives or narcotics due to underlying spinal condition.  . Hydrocodone-Acetaminophen Nausea Only    Drug Allergies:None Known. However the patient does report that he is intolerant of Vicodin which causes severe nausea. He is very sensitive to any sort of sedatives or narcotics due to underlying spinal condition. Drug Allergies:None Known. However the patient does report that he is intolerant of Vicodin which causes severe nausea. He is very sensitive to any sort of sedatives or narcotics due to underlying spinal condition. Drug Allergies:None Known. However the patient does report that he is intolerant of Vicodin which causes severe nausea.  He is very sensitive to any sort of sedatives or narcotics due to underlying spinal condition.     CURRENT MEDICATIONS:  Outpatient Encounter Medications as of 01/26/2019  Medication Sig  . aspirin 81 MG chewable tablet Chew by mouth.  Marland Kitchen aspirin 81 MG tablet Take 81 mg by mouth daily.    Marland Kitchen atorvastatin (LIPITOR) 40 MG tablet Take 40 mg by mouth at bedtime.    . Cholecalciferol (VITAMIN D) 125 MCG (5000 UT) CAPS Take by mouth.  . Coenzyme Q10 (CO Q 10 PO) Take by mouth daily.  Marland Kitchen pyridOXINE (VITAMIN B-6) 100 MG tablet Take 100 mg by mouth daily.  . [DISCONTINUED] meloxicam (MOBIC) 15 MG tablet Take 15 mg by  mouth daily.   No facility-administered encounter medications on file as of 01/26/2019.     PAST MEDICAL HISTORY:   Past Medical History:  Diagnosis Date  . Carpal tunnel syndrome   . Cerebellar ataxia (HCC)    spinal cerebellar ataxia  . Dyslipidemia   . H/O rotator cuff surgery   . Obstructive sleep apnea     PAST SURGICAL HISTORY:   Past Surgical History:  Procedure Laterality Date  . BYPASS GRAFT AORTA TO AORTA     quadruple bypass  . CARPAL TUNNEL RELEASE Left   . EYE SURGERY Bilateral   . SHOULDER SURGERY     bilateral  . TONSILLECTOMY    . VASECTOMY      SOCIAL HISTORY:   Social History   Socioeconomic History  . Marital status: Single    Spouse name: Not on file  . Number of children: 2  . Years of education: Not on file  . Highest education level: Bachelor's degree (e.g., BA, AB, BS)  Occupational History  . Occupation: retired   Engineer, production  . Financial resource strain: Not on file  . Food insecurity    Worry: Not on file    Inability: Not on file  . Transportation needs    Medical: Not on file    Non-medical: Not on file  Tobacco Use  . Smoking status: Never Smoker  . Smokeless tobacco: Never Used  Substance and Sexual Activity  . Alcohol use: No    Alcohol/week: 0.0 standard drinks  . Drug use: No  . Sexual activity: Not on file  Lifestyle  . Physical activity    Days per week: Not on file    Minutes per session: Not on file  . Stress: Not on file  Relationships  . Social Musician on phone: Not on file    Gets together: Not on file    Attends religious service: Not on file    Active member of club or organization: Not on file    Attends meetings of clubs or organizations: Not on file    Relationship status: Not on file  . Intimate partner violence    Fear of current or ex partner: Not on file    Emotionally abused: Not on file    Physically abused: Not on file    Forced sexual activity: Not on file  Other Topics Concern   . Not on file  Social History Narrative   Two story home    Right handed   4 years of college   2 sons   Wife lives with him and son     FAMILY HISTORY:   Family Status  Relation Name Status  . Mother  Deceased       SCA,  died from fall  . Father  Deceased       heart failure  . Sister  Alive       stroke  . Sister  Alive       healthy  . Son  Alive  . Son  Alive    ROS:  Review of Systems  Constitutional: Negative.   HENT: Negative.   Eyes: Negative.   Respiratory: Negative.   Cardiovascular: Negative.   Skin: Negative.   Neurological: Positive for sensory change.    PHYSICAL EXAMINATION:    VITALS:   Vitals:   01/26/19 0945  BP: 135/62  Pulse: 72  SpO2: 98%  Weight: 210 lb (95.3 kg)  Height: 5\' 11"  (1.803 m)    GEN:  Normal appears male in no acute distress.  Appears stated age. HEENT:  Normocephalic, atraumatic. The mucous membranes are moist. The superficial temporal arteries are without ropiness or tenderness. Cardiovascular: Regular rate and rhythm. Lungs: Clear to auscultation bilaterally. Neck/Heme: There are no carotid bruits noted bilaterally.  NEUROLOGICAL: Orientation:  The patient is alert and oriented x 3.  Fund of knowledge is appropriate.  Recent and remote memory intact.  Attention span and concentration normal.  Repeats and names without difficulty. Cranial nerves: There is good facial symmetry. The pupils are equal round and reactive to light bilaterally. Fundoscopic exam reveals clear disc margins bilaterally.  No square wave jerks were seen today.  Extraocular muscles are intact and visual fields are full to confrontational testing. Speech is fluent and mildy dysarthric and pseudobulbar. Soft palate rises symmetrically and there is no tongue deviation. Hearing is intact to conversational tone. Tone: Tone is good throughout. Sensation: Sensation is intact to light touch and pinprick throughout (facial, trunk, extremities). Vibration is  intact at the bilateral big toe but decreased. There is no extinction with double simultaneous stimulation. There is no sensory dermatomal level identified. Coordination:  The patient has trouble with finger-nose with his eyes closed.  He does better with the eyes open.  He is ataxic with heel shin bilaterally. Motor: Strength is 5/5 in the bilateral upper and lower extremities.  Shoulder shrug is equal and symmetric. There is no pronator drift.  There are no fasciculations noted. DTR's: Deep tendon reflexes are 2/4 at the bilateral biceps, triceps, brachioradialis, patella and absent at the bilateral achilles.  Plantar responses are downgoing bilaterally. Gait and Station: The patient is ataxic.  In fact, when he first arises out of the chair without his cane, he nearly falls backwards.  He is wide-based.   IMPRESSION/PLAN  1.  Ataxia  -He was in a study for spinocerebellar ataxia as many years ago, and none were identified back then.  This was many years ago and more types of ataxias have been identified since then.  We will go ahead and order an ataxia panel for spinocerebellar ataxia's.  -We will do an MRI of the brain.  -He asks about taking coenzyme Q 10.  We discussed studies that show that this was not beneficial, and actually could have been harmful and higher dosages.  I do not recommend it.  -Patient is going to be starting physical [facility].  He likely needs a walker instead of just a cane.  Has had multiple falls in the last year. 2.  Peripheral neuropathy  -d/c b6.  Discussed that it can be toxic to the dorsal root ganglion.  Discussed exactly what that meant.  He understood that from his  conversations with Dr. Allena Katz, but he felt that benefits outweigh the risks.  However, after some discussion, he was willing to discontinue and instead try gabapentin.  -try gabapentin 300 q hs.  May need a higher dosage.  Pt to let me know in 3 weeks. 3.  Follow-up will  depend on the results of the above.  Patient was agreeable.  Much greater than 50% of this visit was spent in counseling and coordinating care.  Total face to face time:  40 min   Cc:  Loyal Jacobson, MD

## 2019-01-26 ENCOUNTER — Encounter: Payer: Self-pay | Admitting: Neurology

## 2019-01-26 ENCOUNTER — Other Ambulatory Visit: Payer: Self-pay

## 2019-01-26 ENCOUNTER — Ambulatory Visit: Payer: Medicare HMO | Admitting: Neurology

## 2019-01-26 VITALS — BP 135/62 | HR 72 | Ht 71.0 in | Wt 210.0 lb

## 2019-01-26 DIAGNOSIS — G629 Polyneuropathy, unspecified: Secondary | ICD-10-CM | POA: Diagnosis not present

## 2019-01-26 DIAGNOSIS — R270 Ataxia, unspecified: Secondary | ICD-10-CM | POA: Diagnosis not present

## 2019-01-26 MED ORDER — GABAPENTIN 300 MG PO CAPS: 300.0000 mg | ORAL_CAPSULE | Freq: Every day | ORAL | 1 refills | Status: AC

## 2019-01-26 NOTE — Patient Instructions (Addendum)
1.  Stop B6 2.  Start gabapentin - 300 mg at bedtime.  This may not be enough dosage but give this dosage a try for 3 weeks and then call me if we need daytime dosages. 3. A referral to [facility] has been placed for your MRI someone will contact you directly to schedule your appt. They are located at [place]. Please contact them directly by calling [phone] with any questions regarding your referral.

## 2019-02-01 ENCOUNTER — Telehealth: Payer: Self-pay | Admitting: Neurology

## 2019-02-01 NOTE — Telephone Encounter (Signed)
Left message with after hour service on 02-01-19 @ 12:25 pm   Caller Kathlee Nations from [place] states she has some question concerning lab request and patient insurance

## 2019-02-02 NOTE — Telephone Encounter (Signed)
Comment  03-05-15 DPR SIGNED FOR ALL [facility] OK TO DISCLOSE PHI BY PHONE TO [place] SPOUSE [phone] OK TO LM ON HOME VM [facility]    Called spoke with patient spouse she states that patient is at another doctors office right now. She will have him call office back when he returns home. She does agree with patient signing release for lab results.  A release from [facility] was also fax to our office if patient would like to sign here so we can fax back to received results sooner.

## 2019-02-02 NOTE — Telephone Encounter (Signed)
Yes mailing out form today.

## 2019-02-02 NOTE — Telephone Encounter (Signed)
Patient called back to the office wanting to see if we could send him the papers to sign and he can send it back to [name]? Please Call. Thanks

## 2019-02-02 NOTE — Telephone Encounter (Signed)
See below Patient has had genetic testing with [facility] in the past. They have results from another provider. Patient will need to sing a release to received a copy of the results.

## 2019-02-02 NOTE — Telephone Encounter (Signed)
I did know he had some genetic testing but wasn't aware he had this full panel.  Ask him to sign a release for [name] to get results.  thanks

## 2019-02-02 NOTE — Telephone Encounter (Signed)
Called spoke w/ [employer] needed to be confirmed Also patient had labs for Ataxia at another office they have results on file.  Transferred to another department to see if results could be release to office. They are requesting that labs be reviewed prior to ordering another test.   Spoke with [facility] Request a patient signature to release results, she will mail form to patient to return to them

## 2019-02-06 ENCOUNTER — Telehealth: Payer: Self-pay | Admitting: Neurology

## 2019-02-06 NOTE — Telephone Encounter (Signed)
i've not had bowel issues from gabapentin before.  I would guess that his baseline "bowel issues" are just acting up.  I'm okay if he doesn't want to take it but it generally is not known for GI issues

## 2019-02-06 NOTE — Telephone Encounter (Signed)
Called patient no answer left message to call back for provider response below.

## 2019-02-06 NOTE — Telephone Encounter (Signed)
See below

## 2019-02-06 NOTE — Telephone Encounter (Signed)
Patient called in regarding his Gabapentin medication. He said he is not able to take it anymore. He has taken it for 10 days. He said he has bowl issues anyway and the Gabapentin makes it worse. He uses [facility] in [place]. Thanks

## 2019-02-07 NOTE — Telephone Encounter (Signed)
Spoke with patient she states that he is not having bowel issues. He was having trouble with his balance. He stop taken 2 days ago and has notice his balance has gotten better. Please be aware

## 2019-02-16 ENCOUNTER — Ambulatory Visit
Admission: RE | Admit: 2019-02-16 | Discharge: 2019-02-16 | Disposition: A | Discharge status: Home / Self Care | Payer: Medicare HMO | Source: Ambulatory Visit | Attending: Neurology | Admitting: Neurology

## 2019-02-16 ENCOUNTER — Other Ambulatory Visit: Payer: Self-pay

## 2019-02-16 DIAGNOSIS — R270 Ataxia, unspecified: Secondary | ICD-10-CM

## 2019-02-17 ENCOUNTER — Telehealth: Payer: Self-pay | Admitting: Neurology

## 2019-02-17 NOTE — Telephone Encounter (Signed)
Can you contact [facility] and see if they got the release and if they will send [name] results of prior genetic testing

## 2019-02-17 NOTE — Telephone Encounter (Signed)
Patient advised of MRI results and patient did sign release

## 2019-02-17 NOTE — Telephone Encounter (Signed)
Let pt know that I reviewed MRI brain and not surprisingly, the cerebellum has shrunk over the years (balance center),.  Otherwise, the brain looks good.  See 10/8 phone call - pt was supposed to get a record release from [facility] so that we could see old results.  Did he get/sign that because we haven't received anything from them.  They won't proceed with new testing, so I suspect that the panel I ordered was done previously.

## 2019-02-20 NOTE — Telephone Encounter (Signed)
They never recevied release, he will have to resign and they are going to send to patient again. [phone]

## 2019-02-20 NOTE — Telephone Encounter (Signed)
Thanks.  Will you put it on your reminder list to check in 3 weeks or so

## 2019-03-01 NOTE — Telephone Encounter (Signed)
Patient left message on VM about he has some questions about [facility] and his ins please call

## 2019-03-01 NOTE — Telephone Encounter (Signed)
Please advise patient as I am unsure about [facility] [employer]

## 2019-03-01 NOTE — Telephone Encounter (Signed)
Please contact patient he would like to talk to  You regarding this

## 2019-03-01 NOTE — Telephone Encounter (Signed)
He states that they did send him the box to have the blood test done at a lab he will hold on to it until Dr. Carles Collet receive the previous test results by other provider.  Will call [facility] to see if they receive the record release that was mailed back from the patient.

## 2019-03-01 NOTE — Telephone Encounter (Signed)
Patient called requesting our office contact [place] with the codes for his test at [facility].

## 2019-03-01 NOTE — Telephone Encounter (Signed)
Spoke with patient he states he received both record release he mailed one back to [place] and mailed the other back to our clinic.

## 2019-03-03 NOTE — Telephone Encounter (Signed)
Called spoke with Heidi at [facility] they never received medical record release from patient. She will fax me a copy for patient to sign here in our office to be fax back. Copy will be sent to [phone]. I will call patient to have him stop by the office to sign release.

## 2019-03-03 NOTE — Telephone Encounter (Signed)
Spoke with patient he will come by the office to sign release. Form place at front desk for patient to sign.

## 2019-03-07 ENCOUNTER — Telehealth: Payer: Self-pay | Admitting: Neurology

## 2019-03-07 NOTE — Telephone Encounter (Signed)
Heidi from [facility] called and said the patient did not fill out the request for records completely. He forgot to write that he wants Dr. Carles Collet to receive the previous records. Heidi stated the records cannot be released until the form is filled out in entirety by the patient. She said she'll fax the form to our office but the patient will have to come in and complete it.  See telephone note from 02/01/19 for reference.

## 2019-03-08 NOTE — Telephone Encounter (Signed)
Completed for patient and per patient since his signature is on the copy they faxed he is okay with me filling in the missing information and faxing back to them

## 2019-03-08 NOTE — Telephone Encounter (Signed)
Pending fax for new form

## 2019-03-13 NOTE — Telephone Encounter (Signed)
Sending to clinical staff for review: Okay to sign/close encounter or is further follow up needed? ° °

## 2019-03-16 ENCOUNTER — Telehealth: Payer: Self-pay | Admitting: Neurology

## 2019-03-16 NOTE — Telephone Encounter (Signed)
Left message for patient to call office to let us know if he would like to proceed with genetic testing.

## 2019-03-16 NOTE — Telephone Encounter (Signed)
Got Andre [name] results for the patient.  He tested negative for SCA-5, 13, 14, 17, 12, 28.  This was done on September 29, 2012.  His prior neurologist had suggested that he had had many other SCA testing done, including the dominant cerebellar ataxias.  Prior neuro suggested that he had testing for SCA 1, 2, 3, 6, 7, 8 but I didn't get those.  Could we call Andre [name] and ask if I got all of the results?  [phone]

## 2019-03-16 NOTE — Telephone Encounter (Signed)
Per [facility] patient was not tested for SCA 1, 2, 3, 6, 7, 8.

## 2019-03-16 NOTE — Telephone Encounter (Signed)
I wonder if there was another lab that they tested for these (like [facility] also does these tests).  Let pt know that [facility] sent [name] what they had and while old records implied more was tested for (and likely was), I don't have those results.  We could send away for the others if he would like.  It is up to him if he would like to do those with [facility] given cost and no cure for the ataxias

## 2019-03-17 NOTE — Telephone Encounter (Signed)
Left message for patient that if he wanted to have futher genetic testing to call the office.

## 2019-03-20 ENCOUNTER — Telehealth: Payer: Self-pay

## 2019-03-20 NOTE — Telephone Encounter (Signed)
Spoke with patient regarding genetic testing.  Informed him what test he would need.  He is going to contact his insurance about cost.

## 2019-04-04 ENCOUNTER — Telehealth: Payer: Self-pay | Admitting: Neurology

## 2019-04-04 NOTE — Telephone Encounter (Signed)
Patient will need to contact the company for that information, [facility] [phone]. Left message to call office back

## 2019-04-04 NOTE — Telephone Encounter (Signed)
The following message was left with [facility] on 04/04/19 at 11:54 PM.  Caller states he checked with his insurance company about coverage for genetic testing but he needs more information to give them. He said he needs the procedure codes, diagnostic codes, the doctor's name and address, place of service, and NPI number if possible.  Patient wants a call back from medical staff at the office.

## 2019-09-08 ENCOUNTER — Telehealth: Payer: Self-pay | Admitting: Neurology

## 2019-09-08 NOTE — Telephone Encounter (Signed)
Let pt know that complete dominant ataxia evaluation via [facility] is negative/normal.  I do see that he has been now to movement at [facility] and abnormal/high ANA was noted and they recommended referral to rheum if PCP felt appropriate.  Agree with that.

## 2019-09-08 NOTE — Telephone Encounter (Signed)
Patient has been notified directly and voiced understanding.   

## 2020-04-03 IMAGING — MR MR HEAD W/O CM
10 series · 48 of 48 positions shown · non-contrast
Comparison: None.

CLINICAL DATA: Increased ataxia. Slurred speech. Peripheral
neuropathy.

EXAM:
MRI HEAD WITHOUT CONTRAST
TECHNIQUE: Multiplanar, multiecho pulse sequences of the brain and surrounding
structures were obtained without intravenous contrast.

[Series 2: T1 · sagittal · 5.0mm · 0.45mm/px · 2 of 21 slices shown]
[im 1/21]
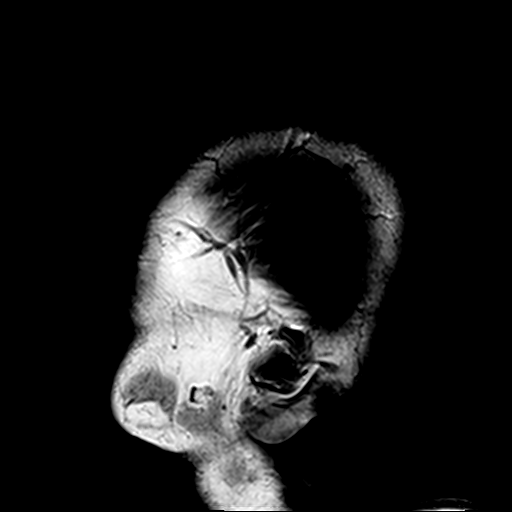
[im 21/21]
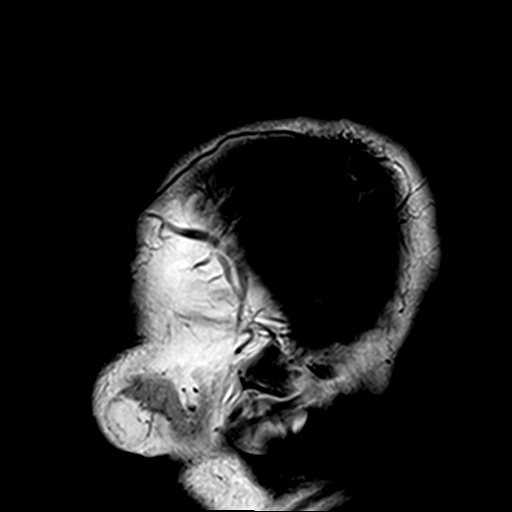

[Series 3: DWI · axial · 3.0mm · 1.80mm/px · z∈[+8,+161]mm · 9 of 108 slices shown (1 of 4)]
[im 1/108]
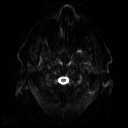
[im 14/108]
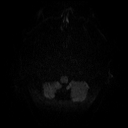
[im 27/108]
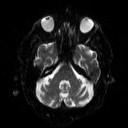
[im 41/108]
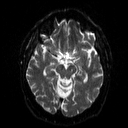
[im 54/108]
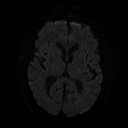
[im 67/108]
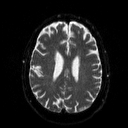
[im 81/108]
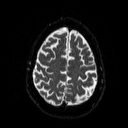
[im 94/108]
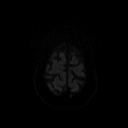
[im 108/108]
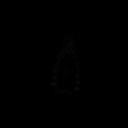

[Series 4: DWI · axial · 3.0mm · 1.80mm/px · z∈[+8,+161]mm · 5 of 54 slices shown (2 of 4)]
[im 1/54]
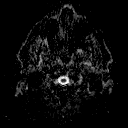
[im 14/54]
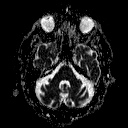
[im 27/54]
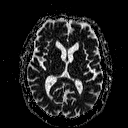
[im 40/54]
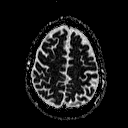
[im 54/54]
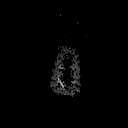

[Series 5: DWI · coronal · 5.0mm · 1.80mm/px · 6 of 76 slices shown (3 of 4)]
[im 1/76]
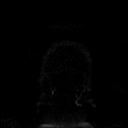
[im 16/76]
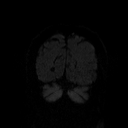
[im 31/76]
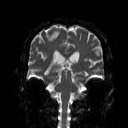
[im 46/76]
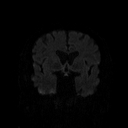
[im 61/76]
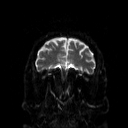
[im 76/76]
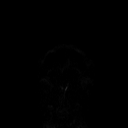

[Series 6: DWI · coronal · 5.0mm · 1.80mm/px · 3 of 38 slices shown (4 of 4)]
[im 1/38]
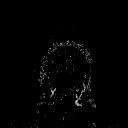
[im 19/38]
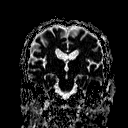
[im 38/38]
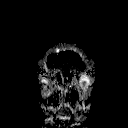

[Series 7: T2 · axial · 5.0mm · 0.51mm/px · z∈[+15,+156]mm · 2 of 22 slices shown (1 of 2)]
[im 1/22]
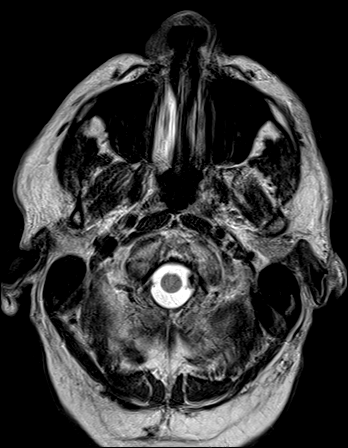
[im 22/22]
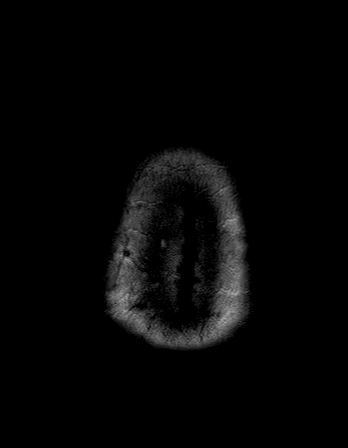

[Series 8: FLAIR · axial · 3.0mm · 0.45mm/px · z∈[+7,+163]mm · 3 of 36 slices shown]
[im 1/36]
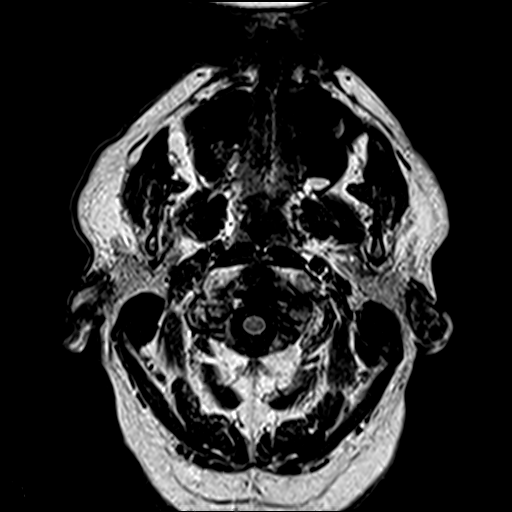
[im 18/36]
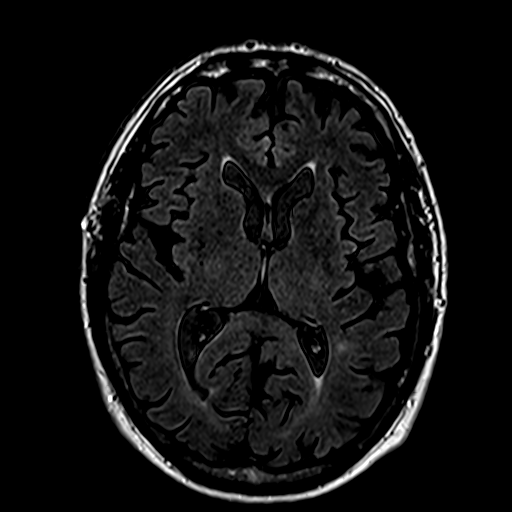
[im 36/36]
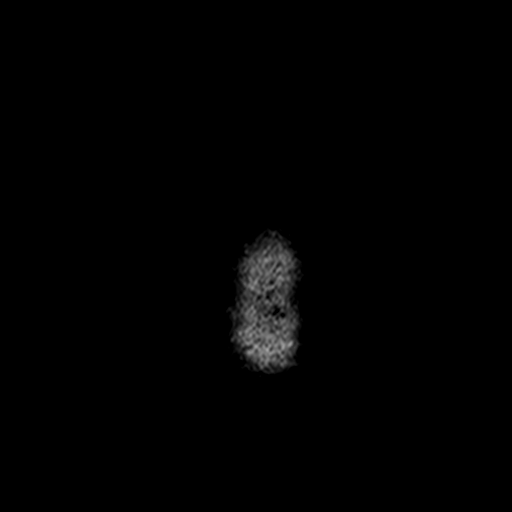

[Series 10: swi_images · axial · 4.0mm · 0.90mm/px · z∈[+18,+152]mm · 3 of 36 slices shown]
[im 1/36]
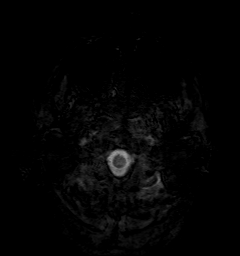
[im 18/36]
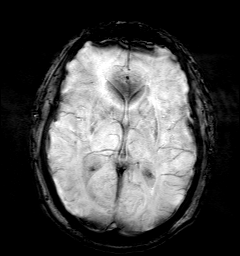
[im 36/36]
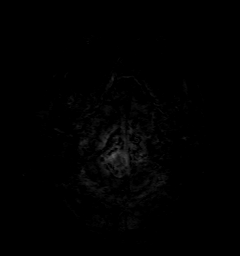

[Series 11: t1_mpr_tra · axial · 1.0mm · 0.71mm/px · z∈[+13,+157]mm · 12 of 144 slices shown]
[im 1/144]
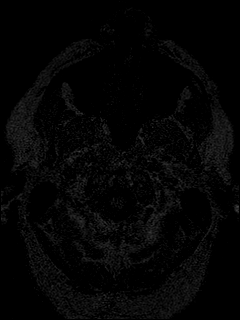
[im 14/144]
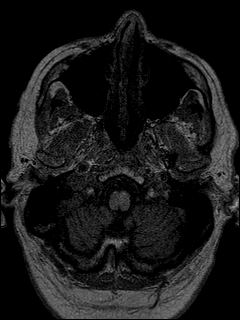
[im 27/144]
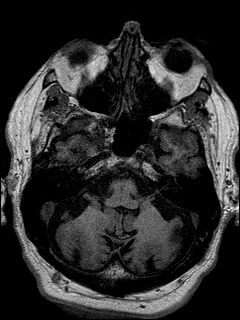
[im 40/144]
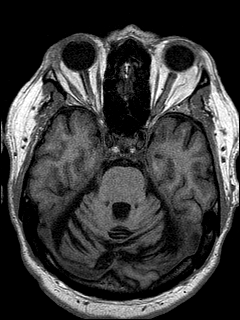
[im 53/144]
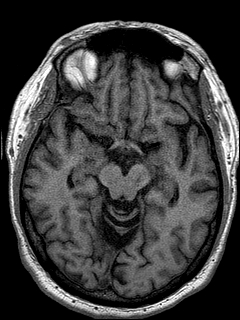
[im 66/144]
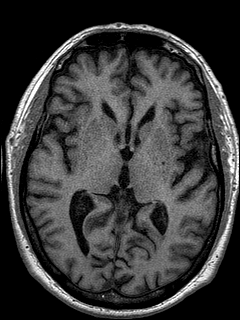
[im 79/144]
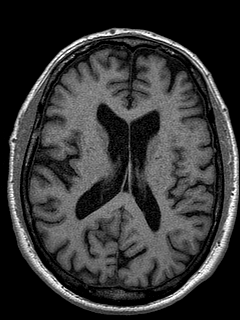
[im 92/144]
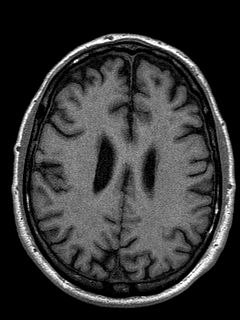
[im 105/144]
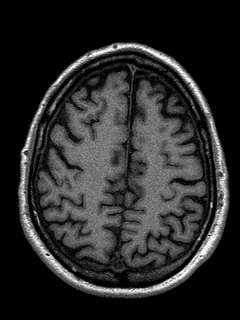
[im 118/144]
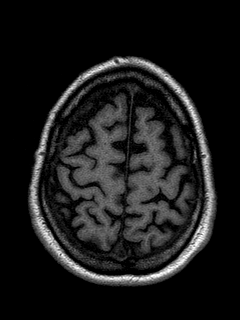
[im 131/144]
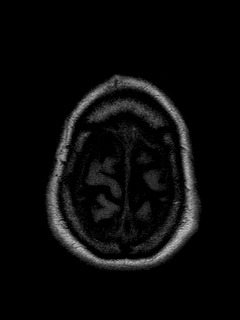
[im 144/144]
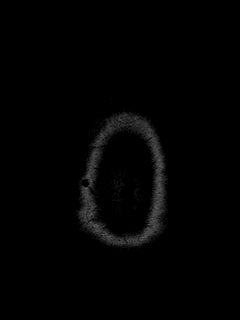

[Series 12: T2 · coronal · 5.0mm · 0.45mm/px · 3 of 30 slices shown (2 of 2)]
[im 1/30]
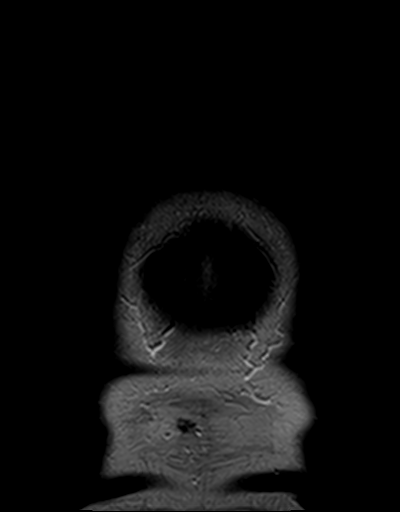
[im 15/30]
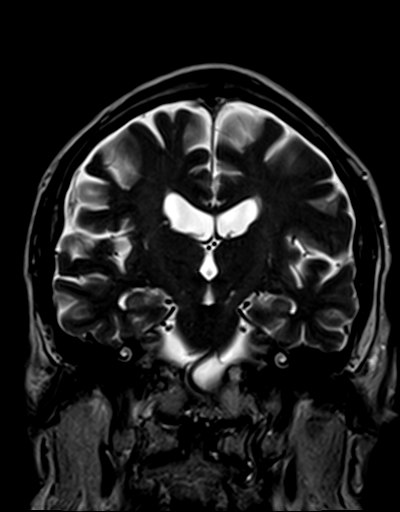
[im 30/30]
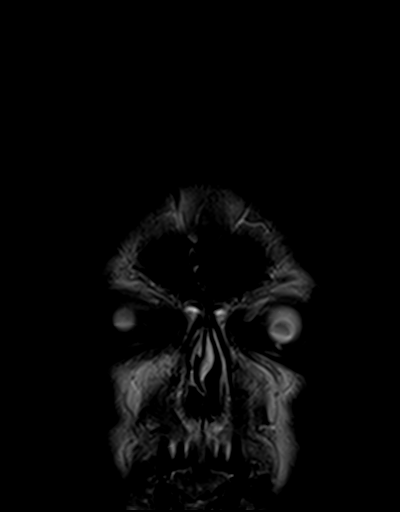

[48 of 48 positions shown; findings below may reference images not displayed]

FINDINGS: Brain: Ventricle size normal. Mild cerebral atrophy. Moderate to
advanced cerebellar atrophy.

Negative for acute infarct. Mild white matter changes are present
left frontal compatible with chronic ischemia. Negative for
hemorrhage or mass. No midline shift.

Vascular: Normal arterial flow voids

Skull and upper cervical spine: No focal bone lesion

Cervical spondylosis and spinal stenosis at C3-4.

Sinuses/Orbits: Negative

Other: None
IMPRESSION: No acute abnormality

Prominent cerebellar atrophy. Correlate with history of Dilantin
treatment or alcohol abuse.

Cervical spondylosis and spinal stenosis at C3-4.

## 2020-04-11 ENCOUNTER — Encounter: Payer: Self-pay | Admitting: Neurology

## 2020-06-22 ENCOUNTER — Emergency Department (HOSPITAL_BASED_OUTPATIENT_CLINIC_OR_DEPARTMENT_OTHER)
Admission: EM | Admit: 2020-06-22 | Discharge: 2020-06-22 | Disposition: A | Discharge status: Home / Self Care | Payer: Medicare HMO | Attending: Emergency Medicine | Admitting: Emergency Medicine

## 2020-06-22 ENCOUNTER — Emergency Department (HOSPITAL_BASED_OUTPATIENT_CLINIC_OR_DEPARTMENT_OTHER): Payer: Medicare HMO

## 2020-06-22 ENCOUNTER — Other Ambulatory Visit: Payer: Self-pay

## 2020-06-22 ENCOUNTER — Encounter (HOSPITAL_BASED_OUTPATIENT_CLINIC_OR_DEPARTMENT_OTHER): Payer: Self-pay

## 2020-06-22 DIAGNOSIS — S76011A Strain of muscle, fascia and tendon of right hip, initial encounter: Secondary | ICD-10-CM | POA: Diagnosis not present

## 2020-06-22 DIAGNOSIS — Z7982 Long term (current) use of aspirin: Secondary | ICD-10-CM | POA: Diagnosis not present

## 2020-06-22 DIAGNOSIS — W1839XA Other fall on same level, initial encounter: Secondary | ICD-10-CM | POA: Diagnosis not present

## 2020-06-22 DIAGNOSIS — Z951 Presence of aortocoronary bypass graft: Secondary | ICD-10-CM | POA: Insufficient documentation

## 2020-06-22 DIAGNOSIS — S79911A Unspecified injury of right hip, initial encounter: Secondary | ICD-10-CM | POA: Diagnosis present

## 2020-06-22 DIAGNOSIS — Y93K1 Activity, walking an animal: Secondary | ICD-10-CM | POA: Insufficient documentation

## 2020-06-22 DIAGNOSIS — I2510 Atherosclerotic heart disease of native coronary artery without angina pectoris: Secondary | ICD-10-CM | POA: Insufficient documentation

## 2020-06-22 MED ORDER — DICLOFENAC SODIUM 1 % EX GEL: 4.0000 g | Freq: Four times a day (QID) | CUTANEOUS | 0 refills | Status: DC

## 2020-06-22 MED ORDER — DICLOFENAC SODIUM 1 % EX GEL: 4.0000 g | Freq: Four times a day (QID) | CUTANEOUS | 0 refills | Status: AC

## 2020-06-22 NOTE — ED Provider Notes (Signed)
[facility] EMERGENCY DEPARTMENT Provider Note   CSN: [id number] Arrival date & time: 06/22/20  1708     History Chief Complaint  Patient presents with  . Leg Injury  . Hip Injury    Andre [name] is a 80 y.o. male.  80 yo M with a significant past medical history of cerebellar ataxia comes in with a chief complaints of a fall.  Patient states that he typically is unsteady on his feet and usually walks with a cane.  He had gotten down into a squat position to pick up something off the ground and he lost his balance and fell onto his right side.  After which she was having pain to his right leg and felt like he was having some difficulty lifting it up secondary to pain.  He denies any pain in his back.  Points to the area in his groin and into the upper lateral aspect of his leg.  He has had a problem like this before some 5 or 6 years ago and had neuromuscular testing that was found to not be nerve compression or weakness.  He eventually had complete resolution of his symptoms.  He thinks it feels similar to that but worse.  The history is provided by the patient.  Injury This is a new problem. The current episode started yesterday. The problem occurs constantly. The problem has not changed since onset.Pertinent negatives include no chest pain, no abdominal pain, no headaches and no shortness of breath. The symptoms are aggravated by bending, twisting and walking. Nothing relieves the symptoms. He has tried nothing for the symptoms. The treatment provided no relief.       Past Medical History:  Diagnosis Date  . Carpal tunnel syndrome   . Cerebellar ataxia (HCC)    spinal cerebellar ataxia  . Dyslipidemia   . H/O rotator cuff surgery   . Obstructive sleep apnea     Patient Active Problem List   Diagnosis Date Noted  . Neuropathy 12/12/2018  . Cerebellar ataxia (HCC) 12/12/2018  . Vitreous membranes and strands 10/20/2018  . Bilateral cold feet 04/07/2017  .  Osteopenia of multiple sites 04/07/2017  . Vitamin D deficiency 04/07/2017  . CAD in native artery 03/09/2017  . Old MI (myocardial infarction) 03/09/2017  . Carpal tunnel syndrome, 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  . 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 PVC's (premature ventricular contractions) 10/09/2015  . Chronic dryness of both eyes 09/26/2015  . Hypermetropia 09/26/2015  . Presbyopia 09/26/2015  . Regular astigmatism 09/26/2015  . Hypermetropia of both eyes 09/26/2015  . Senile nuclear sclerosis 09/26/2015  . Stationary peripheral pterygium of right eye 09/26/2015  . Unspecified esotropia 09/26/2015  . Ventral hernia without obstruction or gangrene 07/30/2015  . Dyslipidemia 05/09/2015  . OSA (obstructive sleep apnea) 05/09/2015  . 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 Unspecified atrial flutter (HCC) 05/09/2015    Past Surgical History:  Procedure Laterality Date  . BYPASS GRAFT AORTA TO AORTA     quadruple bypass  . CARPAL TUNNEL RELEASE Left   . EYE SURGERY Bilateral   . SHOULDER SURGERY     bilateral  . TONSILLECTOMY    . VASECTOMY         Family History  Problem Relation Age of Onset  . Heart failure Father   . Seizures Son     Social History   Tobacco Use  . Smoking status: Never Smoker  . Smokeless tobacco: Never Used  Vaping Use  . Vaping Use: Never used  Substance Use Topics  . Alcohol use: No    Alcohol/week: 0.0 standard drinks  . Drug use: No    Home Medications Prior to Admission medications   Medication Sig Start Date End Date Taking? Authorizing Provider  diclofenac Sodium (VOLTAREN) 1 % GEL Apply 4 g topically 4 (four) times daily. 06/22/20  Yes Melene Plan, DO  aspirin 81 MG chewable tablet Chew by mouth.    [provider]  aspirin 81 MG tablet Take 81 mg by mouth daily.      [provider]  atorvastatin (LIPITOR) 40 MG tablet Take 40 mg by mouth at bedtime.      [provider]  Cholecalciferol  (VITAMIN D) 125 MCG (5000 UT) CAPS Take by mouth.    [provider]  Coenzyme Q10 (CO Q 10 PO) Take by mouth daily.    [provider]  gabapentin (NEURONTIN) 300 MG capsule Take 1 capsule (300 mg total) by mouth at bedtime. 01/26/19   Tat, Octaviano Batty, DO  pyridOXINE (VITAMIN B-6) 100 MG tablet Take 100 mg by mouth daily.    [provider]    Allergies    Vicodin [hydrocodone-acetaminophen] and Hydrocodone-acetaminophen  Review of Systems   Review of Systems  Constitutional: Negative for chills and fever.  HENT: Negative for congestion and facial swelling.   Eyes: Negative for discharge and visual disturbance.  Respiratory: Negative for shortness of breath.   Cardiovascular: Negative for chest pain and palpitations.  Gastrointestinal: Negative for abdominal pain, diarrhea and vomiting.  Musculoskeletal: Positive for arthralgias and myalgias.  Skin: Negative for color change and rash.  Neurological: Negative for tremors, syncope and headaches.  Psychiatric/Behavioral: Negative for confusion and dysphoric mood.    Physical Exam Updated Vital Signs BP (!) 147/71 (BP Location: Right Arm)   Pulse 63   Temp 97.7 F (36.5 C) (Oral)   Resp 18   Ht 5\' 11"  (1.803 m)   Wt 95.3 kg   SpO2 97%   BMI 29.29 kg/m   Physical Exam Vitals and nursing note reviewed.  Constitutional:      Appearance: He is well-developed and well-nourished.  HENT:     Head: Normocephalic and atraumatic.  Eyes:     Extraocular Movements: EOM normal.     Pupils: Pupils are equal, round, and reactive to light.  Neck:     Vascular: No JVD.  Cardiovascular:     Rate and Rhythm: Normal rate and regular rhythm.     Heart sounds: No murmur heard. No friction rub. No gallop.   Pulmonary:     Effort: No respiratory distress.     Breath sounds: No wheezing.  Abdominal:     General: There is no distension.     Tenderness: There is no guarding or rebound.  Musculoskeletal:         General: Normal range of motion.     Cervical back: Normal range of motion and neck supple.     Comments: Patient points to the area of his groin and lateral aspect of his leg as areas of pain.,  Unable to reproduce this on palpation.  No pelvis instability.  Full range of motion of the hip without pain.  He is able to straight leg lift his leg off the bed but states he has some pain with it.  Pulse motor and sensation intact distally.  Skin:    Coloration: Skin is not pale.  Findings: No rash.  Neurological:     Mental Status: He is alert and oriented to person, place, and time.  Psychiatric:        Mood and Affect: Mood and affect normal.        Behavior: Behavior normal.     ED Results / Procedures / Treatments   Labs (all labs ordered are listed, but only abnormal results are displayed) Labs Reviewed - No data to display  EKG None  Radiology DG Hip Unilat W or Wo Pelvis 2-3 Views Right  Result Date: 06/22/2020 CLINICAL DATA:  Right hip pain post fall. EXAM: DG HIP (WITH OR WITHOUT PELVIS) 2-3V RIGHT COMPARISON:  None. FINDINGS: There is no evidence of hip fracture or dislocation. There is no evidence of arthropathy or other focal bone abnormality. IMPRESSION: Negative. Electronically Signed   By: Ted Mcalpine M.D.   On: 06/22/2020 18:06    Procedures Procedures   Medications Ordered in ED Medications - No data to display  ED Course  I have reviewed the triage vital signs and the nursing notes.  Pertinent labs & imaging results that were available during my care of the patient were reviewed by me and considered in my medical decision making (see chart for details).    MDM Rules/Calculators/A&P                          80 yo M with a chief complaints of right hip pain after a fall.  On exam it seems most likely to be a hip flexor strain.  No obvious bony tenderness.  Full range of motion of the hip without significant tenderness.  No pain with extreme internal  rotation.  Will obtain a plain film.  Plain film viewed by me without fracture.  Will discharge patient home.  Treat as strain.  6:44 PM:  I have discussed the diagnosis/risks/treatment options with the patient and believe the pt to be eligible for discharge home to follow-up with PCP. We also discussed returning to the ED immediately if new or worsening sx occur. We discussed the sx which are most concerning (e.g., sudden worsening pain, fever, inability to tolerate by mouth) that necessitate immediate return. Medications administered to the patient during their visit and any new prescriptions provided to the patient are listed below.  Medications given during this visit Medications - No data to display   The patient appears reasonably screen and/or stabilized for discharge and I doubt any other medical condition or other [facility] requiring further screening, evaluation, or treatment in the ED at this time prior to discharge.   Final Clinical Impression(s) / ED Diagnoses Final diagnoses:  Hip strain, right, initial encounter    Rx / DC Orders ED Discharge Orders         Ordered    diclofenac Sodium (VOLTAREN) 1 % GEL  4 times daily        06/22/20 1832           Melene Plan, DO 06/22/20 1844

## 2020-06-22 NOTE — ED Triage Notes (Signed)
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 Today he bent over while walking his dog and lost his balance and fell on his right hip, denies hip or leg pain but states that since the fall his right leg keeps "giving out on him".

## 2020-06-22 NOTE — Discharge Instructions (Signed)
Use the gel as prescribed Also take tylenol 1000mg(2 extra strength) four times a day.    

## 2020-07-29 ENCOUNTER — Encounter: Payer: Self-pay | Admitting: Physical Therapy

## 2020-07-29 ENCOUNTER — Ambulatory Visit: Payer: Medicare HMO | Attending: Internal Medicine | Admitting: Physical Therapy

## 2020-07-29 ENCOUNTER — Other Ambulatory Visit: Payer: Self-pay

## 2020-07-29 DIAGNOSIS — M6281 Muscle weakness (generalized): Secondary | ICD-10-CM | POA: Insufficient documentation

## 2020-07-29 DIAGNOSIS — R260 Ataxic gait: Secondary | ICD-10-CM | POA: Diagnosis present

## 2020-07-29 DIAGNOSIS — Z9181 History of falling: Secondary | ICD-10-CM | POA: Diagnosis present

## 2020-07-29 DIAGNOSIS — R2681 Unsteadiness on feet: Secondary | ICD-10-CM | POA: Insufficient documentation

## 2020-07-29 NOTE — Therapy (Signed)
[facility] [address]  [address] [place], [place], [address] Phone: [phone]   Fax:  [phone]  Physical Therapy Evaluation  Patient Details  Name: Andre [name] MRN: [id number] Date of Birth: June 16, 1940 Referring Provider (PT): Wilhelmina Mcardle, MD (attending MD at [facility])   Encounter Date: 07/29/2020   PT End of Session - 07/29/20 1400    Visit Number 1    Number of Visits 16    Date for PT Re-Evaluation 09/23/20    Authorization Type Aetna Medicare    PT Start Time 1400    PT Stop Time 1450    PT Time Calculation (min) 50 min    Activity Tolerance Patient tolerated treatment well    Behavior During Therapy [facility] for tasks assessed/performed           Past Medical History:  Diagnosis Date  . Carpal tunnel syndrome   . Cerebellar ataxia (HCC)    spinal cerebellar ataxia  . Dyslipidemia   . H/O rotator cuff surgery   . Obstructive sleep apnea     Past Surgical History:  Procedure Laterality Date  . BYPASS GRAFT AORTA TO AORTA     quadruple bypass  . CARPAL TUNNEL RELEASE Left   . EYE SURGERY Bilateral   . SHOULDER SURGERY     bilateral  . TONSILLECTOMY    . VASECTOMY      There were no vitals filed for this visit.    Subjective Assessment - 07/29/20 1405    Subjective Pt reports he was walking his dog on 06/22/20 when he bent over to pick-up something the dog dropped and he fell fwd. Imaging revealed a small fracture in his R hip that did not require surgery. Initally unable to bear on R leg but has improved while at [facility] for rehab for 3 wks returning home Saturday. He reports underlying spinal cerebellar ataxia which has been as issue for him since ~age 42 as far as visual deficits, with balance problems onset around age 42. At baseline he normally walks with a cane or walking stick, but has been using a RW since rehab at [facility].    Pertinent History 06/22/20 - R sided pelvic fracture s/p  fall    Limitations Standing;Walking    How long can you stand comfortably? 10 minutes    How long can you walk comfortably? walking in house or driveway    Diagnostic tests 06/25/20 R hip MRI: positive for acute, nondisplaced fractures of the anterior wall and roof of the right acetabulum. There is also likely a nondisplaced fracture through the posterior aspect of the right inferior pubic ramus.  Mild right pectineus strain without tear.   06/26/20 CT pelvis: 1. The only fracture component well seen at CT is the anterior acetabular wall fracture, but based on MRI there is also an inferior   pubic ramus fracture and some fracture extension into the superior acetabulum on the right. The appearance favors nondisplaced right anterior column fracture.   2. Low-grade edema along the right obturator internus and hip adductor musculature, better seen at MRI.   3. Prostatomegaly.   4. Sigmoid colon diverticulosis.   5. 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   6. Aortic atherosclerosis.    Patient Stated Goals "to improve my balance and strength in my legs"    Currently in Pain? No/denies  [facility] PT Assessment - 07/29/20 1400      Assessment   Medical Diagnosis Unsteadiness on feet s/p fall with R-sided pelvic fracture    Referring Provider (PT) Wilhelmina Mcardle, MD   attending MD at [facility]   Onset Date/Surgical Date 06/22/20    Hand Dominance Right    Next MD Visit none    Prior Therapy 3 wks rehab at [facility]      Precautions   Precautions Fall      Restrictions   Weight Bearing Restrictions No      Balance Screen   Has the patient fallen in the past 6 months Yes    How many times? 3    Has the patient had a decrease in activity level because of a fear of falling?  Yes    Is the patient reluctant to leave their home because of a fear of falling?  No      Home Nurse, mental health  Private residence    Living Arrangements Spouse/significant other;Children   mentally disabled son   Available Help at Discharge Family    Type of Home House    Home Access Stairs to enter    Entrance Stairs-Number of Steps 1-2    Entrance Stairs-Rails Right;Left    Home Layout Two level;Bed/bath upstairs;Able to live on main level with bedroom/bathroom;Laundry or work area in basement    Alternate Teacher, music of Steps 12    Alternate Level Stairs-Rails Right;Left;Can reach both    Magazine features editor - 2 wheels;Cane - single point;Shower seat;Tub bench;Grab bars - tub/shower;Bedside commode   walking/hiking poles     Prior Function   Level of Independence Independent    Vocation Retired    Leisure walking 3x/wk; exercise equip including recumbent bike in basement 3/wk; had been going to [employer] at [facility] prior to pandemic      Cognition   Overall Cognitive Status Within Functional Limits for tasks assessed      ROM / Strength   AROM / PROM / Strength Strength      Strength   Strength Assessment Site Hip;Knee;Ankle    Right/Left Hip Right;Left    Right Hip Flexion 4/5    Right Hip Extension 4-/5    Right Hip External Rotation  4+/5    Right Hip Internal Rotation 4+/5    Right Hip ABduction 4/5    Right Hip ADduction 4-/5    Left Hip Flexion 4/5    Left Hip Extension 4-/5    Left Hip External Rotation 5/5    Left Hip Internal Rotation 5/5    Left Hip ABduction 4+/5    Left Hip ADduction 4+/5    Right/Left Knee Right;Left    Right Knee Flexion 4/5    Right Knee Extension 4+/5    Left Knee Flexion 5/5    Left Knee Extension 5/5    Right/Left Ankle Right;Left    Right Ankle Dorsiflexion 4/5    Left Ankle Dorsiflexion 4/5      Ambulation/Gait   Ambulation/Gait Yes    Ambulation/Gait Assistance 5: Supervision;4: Min guard    Assistive device Rolling walker    Gait Pattern Step-through pattern;Ataxic;Trunk flexed;Decreased stride length   increased trunk  instability   Ambulation Surface Level;Indoor    Gait velocity 2.48 ft/sec      Standardized Balance Assessment   Standardized Balance Assessment Berg Balance Test;Timed Up and Go Test;10 meter walk test    10 Meter Walk  13.25 sec with RW      Berg Balance Test   Sit to Stand Able to stand  independently using hands    Standing Unsupported Able to stand 2 minutes with supervision    Sitting with Back Unsupported but Feet Supported on Floor or Stool Able to sit safely and securely 2 minutes    Stand to Sit Controls descent by using hands    Transfers Able to transfer safely, definite need of hands    Standing Unsupported with Eyes Closed Able to stand 3 seconds    Standing Unsupported with Feet Together Able to place feet together independently and stand for 1 minute with supervision    From Standing, Reach Forward with Outstretched Arm Can reach forward >12 cm safely (5")    From Standing Position, Pick up Object from Floor Able to pick up shoe, needs supervision    From Standing Position, Turn to Look Behind Over each Shoulder Needs supervision when turning    Turn 360 Degrees Needs close supervision or verbal cueing    Standing Unsupported, Alternately Place Feet on Step/Stool Able to complete >2 steps/needs minimal assist    Standing Unsupported, One Foot in Front Able to take small step independently and hold 30 seconds    Standing on One Leg Unable to try or needs assist to prevent fall    Total Score 32    Berg comment: < 36 high risk for falls (close to 100%)      Timed Up and Go Test   Normal TUG (seconds) 21.53   with RW   TUG Comments Normal: >13.5 sec indicates high fall risk                      Objective measurements completed on examination: See above findings.                 PT Short Term Goals - 07/29/20 1450      PT SHORT TERM GOAL #1   Title Patient will be independent with initial HEP    Status New    Target Date 08/19/20      PT  SHORT TERM GOAL #2   Title Patient will demonstrate safe transfer technique with RW to reduce fall risk    Status New    Target Date 08/19/20      PT SHORT TERM GOAL #3   Title Patient will demonstrate safe gait pattern >500 ft with RW on all surfaces to reduce risk for falls    Status New    Target Date 08/26/20      PT SHORT TERM GOAL #4   Title Patient will improve Berg score to >/= 38/56 to improve safety stability with ADLs in standing and reduce risk for falls    Status New    Target Date 08/26/20      PT SHORT TERM GOAL #5   Title Patient will safely negotiate stairs with handraill support to allow safe access to bedroom and home egress    Status New    Target Date 08/26/20             PT Long Term Goals - 07/29/20 1450      PT LONG TERM GOAL #1   Title Patient will be independent with ongoing/advanced HEP +/- gym program for self-management at home    Status New    Target Date 09/23/20      PT LONG TERM GOAL #2   Title  Patient will demonstrate improved B LE strength to >/= 4+/5 for improved stability and ease of mobility    Status New    Target Date 09/23/20      PT LONG TERM GOAL #3   Title Patient to demonstrate symmetrical step length, good foot clearance, and good heel-toe pattern with upright posture when ambulating with SPC or walking poles    Status New    Target Date 09/23/20      PT LONG TERM GOAL #4   Title Patient will demonstrate decreased TUG time to </= 15 sec with LRAD to decrease risk for falls with transitional mobility    Status New    Target Date 09/23/20      PT LONG TERM GOAL #5   Title Patient will improve Berg score to >/= 45/56 to improve safety stability with ADLs in standing and reduce risk for falls    Status New    Target Date 09/23/20                  Plan - 07/29/20 1450    Clinical Impression Statement Mykell is a 80 y/o male who is referred to OP PT for decreased mobility and gait s/p fall on 06/22/20 resulting in  R-sided pelvic fractures. He has recently completed a 3 -week rehab stay in the SNF at [facility] returning home on Saturday 07/27/20. He reports pain mostly resolved at this point but mobility still limited by chronic spinal cerebellar ataxia which has led to worsening balance during the period where he was more immobile due to pain. Current deficits include decreased B hip extension ROM/tight hip flexors, R>L LE weakness, ataxia, gait instability with increased dependence on AD and decreased gait speed at 2.48 ft/sec with RW as well as balance deficits with high fall risk as evidenced by TUG time of 21.53 sec and Berg score of 32/56.  Andre [name] will benefit from skilled PT to address above flexibility, strength and balance deficits to improve functional use of R LE as well as increase safety/stability with transfers, gait and ADLs to decrease risk for further falls.    Personal Factors and Comorbidities Comorbidity 3+;Age;Past/Current Experience;Time since onset of injury/illness/exacerbation    Comorbidities chronic spinal cerebellar ataxia, neuropathy, CAD s/p CABG x 4, Aflutter, osteopenia, B shoulder surgery, carpal tunnel release, OSA    Examination-Activity Limitations Bed Mobility;Bend;Caring for Others;Locomotion Level;Transfers;Stand;Stairs;Squat;Toileting    Examination-Participation Restrictions Community Activity;Driving;Interpersonal Relationship;Meal Prep;Shop;Yard Work    Conservation officer, historic buildings Evolving/Moderate complexity    Clinical Decision Making Moderate    Rehab Potential Good    PT Frequency 2x / week    PT Duration 8 weeks    PT Treatment/Interventions ADLs/Self Care Home Management;DME Instruction;Gait training;Stair training;Functional mobility training;Therapeutic activities;Therapeutic exercise;Balance training;Neuromuscular re-education;Patient/family education;Manual techniques;Passive range of motion;Dry needling    PT Next Visit Plan Establish initial HEP - hip  flexibilty & LE strengthening    Consulted and Agree with Plan of Care Patient;Family member/caregiver    Family Member Consulted wife - Ginny           Patient will benefit from skilled therapeutic intervention in order to improve the following deficits and impairments:  Abnormal gait,Decreased activity tolerance,Decreased balance,Decreased coordination,Decreased endurance,Decreased knowledge of precautions,Decreased knowledge of use of DME,Decreased mobility,Decreased safety awareness,Decreased strength,Difficulty walking,Impaired perceived functional ability,Impaired flexibility,Improper body mechanics,Postural dysfunction  Visit Diagnosis: Unsteadiness on feet  Ataxic gait  Muscle weakness (generalized)  History of falling     Problem List Patient Active Problem List   Diagnosis  Date Noted  . Neuropathy 12/12/2018  . Cerebellar ataxia (HCC) 12/12/2018  . Vitreous membranes and strands 10/20/2018  . Bilateral cold feet 04/07/2017  . Osteopenia of multiple sites 04/07/2017  . Vitamin D deficiency 04/07/2017  . CAD in native artery 03/09/2017  . Old MI (myocardial infarction) 03/09/2017  . Carpal tunnel syndrome, 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  . 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 PVC's (premature ventricular contractions) 10/09/2015  . Chronic dryness of both eyes 09/26/2015  . Hypermetropia 09/26/2015  . Presbyopia 09/26/2015  . Regular astigmatism 09/26/2015  . Hypermetropia of both eyes 09/26/2015  . Senile nuclear sclerosis 09/26/2015  . Stationary peripheral pterygium of right eye 09/26/2015  . Unspecified esotropia 09/26/2015  . Ventral hernia without obstruction or gangrene 07/30/2015  . Dyslipidemia 05/09/2015  . OSA (obstructive sleep apnea) 05/09/2015  . 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 Unspecified atrial flutter (HCC) 05/09/2015    Marry Guan, PT, MPT 07/29/2020, 5:49 PM  [facility] [address]  [address] [place], [place], [address] Phone: [phone]   Fax:  [phone]  Name: Andre [name] MRN: [id number] Date of Birth: 1941-04-18

## 2020-07-31 ENCOUNTER — Other Ambulatory Visit: Payer: Self-pay

## 2020-07-31 ENCOUNTER — Ambulatory Visit: Payer: Medicare HMO

## 2020-07-31 DIAGNOSIS — M6281 Muscle weakness (generalized): Secondary | ICD-10-CM

## 2020-07-31 DIAGNOSIS — R260 Ataxic gait: Secondary | ICD-10-CM

## 2020-07-31 DIAGNOSIS — R2681 Unsteadiness on feet: Secondary | ICD-10-CM

## 2020-07-31 DIAGNOSIS — Z9181 History of falling: Secondary | ICD-10-CM

## 2020-07-31 NOTE — Therapy (Signed)
[facility] [address]  [address] [place], [place], [address] Phone: [phone]   Fax:  [phone]  Physical Therapy Treatment  Patient Details  Name: Andre [name] MRN: [id number] Date of Birth: [birth year] Referring Provider (PT): Wilhelmina Mcardle, MD (attending MD at [facility])   Encounter Date: 07/31/2020   PT End of Session - 07/31/20 1446    Visit Number 2    Number of Visits 16    Date for PT Re-Evaluation 09/23/20    Authorization Type Aetna Medicare    PT Start Time 1402    PT Stop Time 1441    PT Time Calculation (min) 39 min    Activity Tolerance Patient tolerated treatment well    Behavior During Therapy [facility] for tasks assessed/performed           Past Medical History:  Diagnosis Date  . Carpal tunnel syndrome   . Cerebellar ataxia (HCC)    spinal cerebellar ataxia  . Dyslipidemia   . H/O rotator cuff surgery   . Obstructive sleep apnea     Past Surgical History:  Procedure Laterality Date  . BYPASS GRAFT AORTA TO AORTA     quadruple bypass  . CARPAL TUNNEL RELEASE Left   . EYE SURGERY Bilateral   . SHOULDER SURGERY     bilateral  . TONSILLECTOMY    . VASECTOMY      There were no vitals filed for this visit.   Subjective Assessment - 07/31/20 1521    Subjective Pt reports having some exercises from old PT at [facility], notes cont balance issues.    Pertinent History 06/22/20 - R sided pelvic fracture s/p fall    Diagnostic tests 06/25/20 R hip MRI: positive for acute, nondisplaced fractures of the anterior wall and roof of the right acetabulum. There is also likely a nondisplaced fracture through the posterior aspect of the right inferior pubic ramus.  Mild right pectineus strain without tear.   06/26/20 CT pelvis: 1. The only fracture component well seen at CT is the anterior acetabular wall fracture, but based on MRI there is also an inferior   pubic ramus fracture and some fracture extension  into the superior acetabulum on the right. The appearance favors nondisplaced right anterior column fracture.   2. Low-grade edema along the right obturator internus and hip adductor musculature, better seen at MRI.   3. Prostatomegaly.   4. Sigmoid colon diverticulosis.   5. 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   6. Aortic atherosclerosis.    Patient Stated Goals "to improve my balance and strength in my legs"    Currently in Pain? No/denies                             [facility] Adult PT Treatment/Exercise - 07/31/20 0001      Exercises   Exercises Knee/Hip      Knee/Hip Exercises: Standing   Hip Abduction Stengthening;Both;1 set;10 reps;Knee straight      Knee/Hip Exercises: Seated   Other Seated Knee/Hip Exercises clamshell Rtband 10 reps B    Marching Strengthening;Both;1 set;10 reps;Limitations    Marching Limitations Rtband      Knee/Hip Exercises: Supine   Bridges Strengthening;Both;1 set;10 reps    Straight Leg Raises Strengthening;Both;1 set;10 reps    Straight Leg Raises Limitations cues needed for eccentric control  Balance Exercises - 07/31/20 0001      Balance Exercises: Standing   Standing Eyes Closed Narrow base of support (BOS);Foam/compliant surface;3 reps   15 seconds, lot of instability and swaying d/t ataxia   Other Standing Exercises alt toe taps onto airex 10 reps B             PT Education - 07/31/20 1445    Education Details HEP: Access Code: [email or web address]    Person(s) Educated Patient    Methods Explanation;Demonstration;Verbal cues;Tactile cues;Handout    Comprehension Verbalized understanding;Returned demonstration;Verbal cues required;Tactile cues required            PT Short Term Goals - 07/31/20 1727      PT SHORT TERM GOAL #1   Title Patient will be independent with initial HEP    Status On-going    Target Date 08/19/20      PT  SHORT TERM GOAL #2   Title Patient will demonstrate safe transfer technique with RW to reduce fall risk    Status On-going    Target Date 08/19/20      PT SHORT TERM GOAL #3   Title Patient will demonstrate safe gait pattern >500 ft with RW on all surfaces to reduce risk for falls    Status On-going    Target Date 08/26/20      PT SHORT TERM GOAL #4   Title Patient will improve Berg score to >/= 38/56 to improve safety stability with ADLs in standing and reduce risk for falls    Status On-going    Target Date 08/26/20      PT SHORT TERM GOAL #5   Title Patient will safely negotiate stairs with handraill support to allow safe access to bedroom and home egress    Status On-going    Target Date 08/26/20             PT Long Term Goals - 07/31/20 1727      PT LONG TERM GOAL #1   Title Patient will be independent with ongoing/advanced HEP +/- gym program for self-management at home    Status On-going      PT LONG TERM GOAL #2   Title Patient will demonstrate improved B LE strength to >/= 4+/5 for improved stability and ease of mobility    Status On-going      PT LONG TERM GOAL #3   Title Patient to demonstrate symmetrical step length, good foot clearance, and good heel-toe pattern with upright posture when ambulating with SPC or walking poles    Status On-going      PT LONG TERM GOAL #4   Title Patient will demonstrate decreased TUG time to </= 15 sec with LRAD to decrease risk for falls with transitional mobility    Status On-going      PT LONG TERM GOAL #5   Title Patient will improve Berg score to >/= 45/56 to improve safety stability with ADLs in standing and reduce risk for falls    Status On-going                 Plan - 07/31/20 1519    Clinical Impression Statement Pt reported to clinic with rolling walker. Did some light lumbopelvic and hip strengthening, good response with the exercises, he needed some cueing to perform exercises with slow and controlled  movements. He demonstrated broken movements with exercises d/t ataxia. Tried static balance exercises with him showing some unsteadiness with narrow stance on airex pad, occasional LOB  with the toe taps as well. He required min assist with gait belt in order tot maintain stability. Will need continued work on static and dynamic balance to improve stability and strategies of balance.    Personal Factors and Comorbidities Comorbidity 3+;Age;Past/Current Experience;Time since onset of injury/illness/exacerbation    Comorbidities chronic spinal cerebellar ataxia, neuropathy, CAD s/p CABG x 4, Aflutter, osteopenia, B shoulder surgery, carpal tunnel release, OSA    PT Frequency 2x / week    PT Duration 8 weeks    PT Treatment/Interventions ADLs/Self Care Home Management;DME Instruction;Gait training;Stair training;Functional mobility training;Therapeutic activities;Therapeutic exercise;Balance training;Neuromuscular re-education;Patient/family education;Manual techniques;Passive range of motion;Dry needling    PT Next Visit Plan hip flexibilty & LE strengthening    Consulted and Agree with Plan of Care Patient;Family member/caregiver    Family Member Consulted wife - Ginny           Patient will benefit from skilled therapeutic intervention in order to improve the following deficits and impairments:  Abnormal gait,Decreased activity tolerance,Decreased balance,Decreased coordination,Decreased endurance,Decreased knowledge of precautions,Decreased knowledge of use of DME,Decreased mobility,Decreased safety awareness,Decreased strength,Difficulty walking,Impaired perceived functional ability,Impaired flexibility,Improper body mechanics,Postural dysfunction  Visit Diagnosis: Unsteadiness on feet  Ataxic gait  Muscle weakness (generalized)  History of falling     Problem List Patient Active Problem List   Diagnosis Date Noted  . Neuropathy 12/12/2018  . Cerebellar ataxia (HCC) 12/12/2018  .  Vitreous membranes and strands 10/20/2018  . Bilateral cold feet 04/07/2017  . Osteopenia of multiple sites 04/07/2017  . Vitamin D deficiency 04/07/2017  . CAD in native artery 03/09/2017  . Old MI (myocardial infarction) 03/09/2017  . Carpal tunnel syndrome, 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  . 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 PVC's (premature ventricular contractions) 10/09/2015  . Chronic dryness of both eyes 09/26/2015  . Hypermetropia 09/26/2015  . Presbyopia 09/26/2015  . Regular astigmatism 09/26/2015  . Hypermetropia of both eyes 09/26/2015  . Senile nuclear sclerosis 09/26/2015  . Stationary peripheral pterygium of right eye 09/26/2015  . Unspecified esotropia 09/26/2015  . Ventral hernia without obstruction or gangrene 07/30/2015  . Dyslipidemia 05/09/2015  . OSA (obstructive sleep apnea) 05/09/2015  . 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 Unspecified atrial flutter (HCC) 05/09/2015    Darleene Cleaver, PTA 07/31/2020, 5:29 PM  [facility] [address]  [address] [place], [place], [address] Phone: [phone]   Fax:  [phone]  Name: Andre [name] MRN: [id number] Date of Birth: 03/29/1941

## 2020-08-06 ENCOUNTER — Ambulatory Visit: Payer: Medicare HMO

## 2020-08-06 ENCOUNTER — Other Ambulatory Visit: Payer: Self-pay

## 2020-08-06 DIAGNOSIS — M6281 Muscle weakness (generalized): Secondary | ICD-10-CM

## 2020-08-06 DIAGNOSIS — R2681 Unsteadiness on feet: Secondary | ICD-10-CM

## 2020-08-06 DIAGNOSIS — R260 Ataxic gait: Secondary | ICD-10-CM

## 2020-08-06 DIAGNOSIS — Z9181 History of falling: Secondary | ICD-10-CM

## 2020-08-06 NOTE — Therapy (Signed)
[facility] [address]  [address] [place], [place], [address] Phone: [phone]   Fax:  [phone]  Physical Therapy Treatment  Patient Details  Name: Andre [name] MRN: [id number] Date of Birth: [birth year] Referring Provider (PT): Wilhelmina Mcardle, MD (attending MD at [facility])   Encounter Date: 08/06/2020   PT End of Session - 08/06/20 1621    Visit Number 3    Number of Visits 16    Date for PT Re-Evaluation 09/23/20    Authorization Type Aetna Medicare    PT Start Time 1532    PT Stop Time 1611    PT Time Calculation (min) 39 min    Activity Tolerance Patient tolerated treatment well    Behavior During Therapy [facility] for tasks assessed/performed           Past Medical History:  Diagnosis Date  . Carpal tunnel syndrome   . Cerebellar ataxia (HCC)    spinal cerebellar ataxia  . Dyslipidemia   . H/O rotator cuff surgery   . Obstructive sleep apnea     Past Surgical History:  Procedure Laterality Date  . BYPASS GRAFT AORTA TO AORTA     quadruple bypass  . CARPAL TUNNEL RELEASE Left   . EYE SURGERY Bilateral   . SHOULDER SURGERY     bilateral  . TONSILLECTOMY    . VASECTOMY      There were no vitals filed for this visit.   Subjective Assessment - 08/06/20 1536    Subjective Pt has been doing good.    Pertinent History 06/22/20 - R sided pelvic fracture s/p fall    Diagnostic tests 06/25/20 R hip MRI: positive for acute, nondisplaced fractures of the anterior wall and roof of the right acetabulum. There is also likely a nondisplaced fracture through the posterior aspect of the right inferior pubic ramus.  Mild right pectineus strain without tear.   06/26/20 CT pelvis: 1. The only fracture component well seen at CT is the anterior acetabular wall fracture, but based on MRI there is also an inferior   pubic ramus fracture and some fracture extension into the superior acetabulum on the right. The appearance  favors nondisplaced right anterior column fracture.   2. Low-grade edema along the right obturator internus and hip adductor musculature, better seen at MRI.   3. Prostatomegaly.   4. Sigmoid colon diverticulosis.   5. 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   6. Aortic atherosclerosis.    Currently in Pain? No/denies                             [facility] Adult PT Treatment/Exercise - 08/06/20 0001      Exercises   Exercises Knee/Hip      Knee/Hip Exercises: Aerobic   Nustep L3x25min      Knee/Hip Exercises: Standing   Heel Raises Both;1 set;10 reps    Heel Raises Limitations with toe raises; 2 HHA    Hip Abduction Stengthening;Both;1 set;10 reps;Knee straight    Abduction Limitations 2 HHA    Hip Extension Stengthening;Both;1 set;10 reps;Knee straight    Extension Limitations 2 HHA      Knee/Hip Exercises: Seated   Marching Strengthening;Both;2 sets;10 reps    Marching Limitations Rtband      Knee/Hip Exercises: Supine   Bridges Strengthening;Both;1 set;10 reps    Bridges Limitations R Tband, iso  hip abd    Bridges with Beacher May Strengthening;Both;1 set;10 reps    Straight Leg Raises Strengthening;Both;1 set;10 reps    Straight Leg Raises Limitations 3" hold    Other Supine Knee/Hip Exercises clamshells R Tband 10x3"               Balance Exercises - 08/06/20 0001      Balance Exercises: Standing   Tandem Stance Upper extremity support 1;2 reps;15 secs   each side in front 2 reps 15 sec   Other Standing Exercises alt toe taps onto airex 10 reps B               PT Short Term Goals - 07/31/20 1727      PT SHORT TERM GOAL #1   Title Patient will be independent with initial HEP    Status On-going    Target Date 08/19/20      PT SHORT TERM GOAL #2   Title Patient will demonstrate safe transfer technique with RW to reduce fall risk    Status On-going    Target Date  08/19/20      PT SHORT TERM GOAL #3   Title Patient will demonstrate safe gait pattern >500 ft with RW on all surfaces to reduce risk for falls    Status On-going    Target Date 08/26/20      PT SHORT TERM GOAL #4   Title Patient will improve Berg score to >/= 38/56 to improve safety stability with ADLs in standing and reduce risk for falls    Status On-going    Target Date 08/26/20      PT SHORT TERM GOAL #5   Title Patient will safely negotiate stairs with handraill support to allow safe access to bedroom and home egress    Status On-going    Target Date 08/26/20             PT Long Term Goals - 07/31/20 1727      PT LONG TERM GOAL #1   Title Patient will be independent with ongoing/advanced HEP +/- gym program for self-management at home    Status On-going      PT LONG TERM GOAL #2   Title Patient will demonstrate improved B LE strength to >/= 4+/5 for improved stability and ease of mobility    Status On-going      PT LONG TERM GOAL #3   Title Patient to demonstrate symmetrical step length, good foot clearance, and good heel-toe pattern with upright posture when ambulating with SPC or walking poles    Status On-going      PT LONG TERM GOAL #4   Title Patient will demonstrate decreased TUG time to </= 15 sec with LRAD to decrease risk for falls with transitional mobility    Status On-going      PT LONG TERM GOAL #5   Title Patient will improve Berg score to >/= 45/56 to improve safety stability with ADLs in standing and reduce risk for falls    Status On-going                 Plan - 08/06/20 1622    Clinical Impression Statement Pt ambulated into clinic with FWW. Progressed balance exercises, with instructions on keeping posture upright and shifting weight through ankles to recover balance. CGA was required to maintain stability and safety during these exercises. He shows decreased stability when having to shift weight posteriorly on his feet and to the L side.  Pt responded well w/o any reports of pain and even noted some improvements in his LE strength during the day.    Personal Factors and Comorbidities Comorbidity 3+;Age;Past/Current Experience;Time since onset of injury/illness/exacerbation    Comorbidities chronic spinal cerebellar ataxia, neuropathy, CAD s/p CABG x 4, Aflutter, osteopenia, B shoulder surgery, carpal tunnel release, OSA    PT Frequency 2x / week    PT Duration 8 weeks    PT Treatment/Interventions ADLs/Self Care Home Management;DME Instruction;Gait training;Stair training;Functional mobility training;Therapeutic activities;Therapeutic exercise;Balance training;Neuromuscular re-education;Patient/family education;Manual techniques;Passive range of motion;Dry needling    PT Next Visit Plan hip flexibilty & LE strengthening    Consulted and Agree with Plan of Care Patient;Family member/caregiver    Family Member Consulted wife - Ginny           Patient will benefit from skilled therapeutic intervention in order to improve the following deficits and impairments:  Abnormal gait,Decreased activity tolerance,Decreased balance,Decreased coordination,Decreased endurance,Decreased knowledge of precautions,Decreased knowledge of use of DME,Decreased mobility,Decreased safety awareness,Decreased strength,Difficulty walking,Impaired perceived functional ability,Impaired flexibility,Improper body mechanics,Postural dysfunction  Visit Diagnosis: Unsteadiness on feet  Ataxic gait  Muscle weakness (generalized)  History of falling     Problem List Patient Active Problem List   Diagnosis Date Noted  . Neuropathy 12/12/2018  . Cerebellar ataxia (HCC) 12/12/2018  . Vitreous membranes and strands 10/20/2018  . Bilateral cold feet 04/07/2017  . Osteopenia of multiple sites 04/07/2017  . Vitamin D deficiency 04/07/2017  . CAD in native artery 03/09/2017  . Old MI (myocardial infarction) 03/09/2017  . Carpal tunnel syndrome, right  04/28/2016  . 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 PVC's (premature ventricular contractions) 10/09/2015  . Chronic dryness of both eyes 09/26/2015  . Hypermetropia 09/26/2015  . Presbyopia 09/26/2015  . Regular astigmatism 09/26/2015  . Hypermetropia of both eyes 09/26/2015  . Senile nuclear sclerosis 09/26/2015  . Stationary peripheral pterygium of right eye 09/26/2015  . Unspecified esotropia 09/26/2015  . Ventral hernia without obstruction or gangrene 07/30/2015  . Dyslipidemia 05/09/2015  . OSA (obstructive sleep apnea) 05/09/2015  . 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 Unspecified atrial flutter (HCC) 05/09/2015    Darleene Cleaver, PTA 08/06/2020, 5:01 PM  [facility] [address]  [address] [place], [place], [address] Phone: [phone]   Fax:  [phone]  Name: Andre [name] MRN: [id number] Date of Birth: 18-Oct-1940

## 2020-08-07 ENCOUNTER — Ambulatory Visit: Payer: Medicare HMO | Admitting: Physical Therapy

## 2020-08-12 ENCOUNTER — Other Ambulatory Visit: Payer: Self-pay

## 2020-08-12 ENCOUNTER — Encounter: Payer: Self-pay | Admitting: Physical Therapy

## 2020-08-12 ENCOUNTER — Ambulatory Visit: Payer: Medicare HMO | Admitting: Physical Therapy

## 2020-08-12 DIAGNOSIS — R2681 Unsteadiness on feet: Secondary | ICD-10-CM | POA: Diagnosis not present

## 2020-08-12 DIAGNOSIS — M6281 Muscle weakness (generalized): Secondary | ICD-10-CM

## 2020-08-12 DIAGNOSIS — R260 Ataxic gait: Secondary | ICD-10-CM

## 2020-08-12 DIAGNOSIS — Z9181 History of falling: Secondary | ICD-10-CM

## 2020-08-12 NOTE — Patient Instructions (Signed)
      Access Code: [email or web address] URL: [email or web address] Date: 08/12/2020 Prepared by: Glenetta Hew  Exercises Hooklying Hamstring Stretch with Strap - 2 x daily - 7 x weekly - 2-3 reps - 30 sec hold Supine ITB Stretch with Strap - 2 x daily - 7 x weekly - 2-3 reps - 30 sec hold Supine Piriformis Stretch with Foot on Ground - 2 x daily - 7 x weekly - 2-3 reps - 30 sec hold Supine Figure 4 Piriformis Stretch - 2 x daily - 7 x weekly - 2-3 reps - 30 sec hold Seated Hip Flexor Stretch - 2 x daily - 7 x weekly - 2-3 reps - 30 sec hold Standing Hip Extension with Ankle Weight - 1 x daily - 7 x weekly - 2 sets - 10 reps - 3 sec hold Standing Hip Abduction with Ankle Weight - 1 x daily - 7 x weekly - 2 sets - 10 reps - 3 sec hold Standing Hip Adduction with Ankle Weight - 1 x daily - 7 x weekly - 2 sets - 10 reps - 3 sec hold Standing March with Counter Support - 1 x daily - 7 x weekly - 2 sets - 10 reps - 3 sec hold

## 2020-08-12 NOTE — Therapy (Signed)
[facility] [address]  [place], [place], [address] Phone: [phone]   Fax:  [phone]  Physical Therapy Treatment  Patient Details  Name: Andre [name] MRN: [id number] Date of Birth: [birth year] Referring Provider (PT): Merilynn Finland, MD (attending MD at [facility])   Encounter Date: 08/12/2020   PT End of Session - 08/12/20 1450    Visit Number 4    Number of Visits 16    Date for PT Re-Evaluation 09/23/20    Authorization Type Aetna Medicare    PT Start Time 1450    PT Stop Time 1537    PT Time Calculation (min) 47 min    Activity Tolerance Patient tolerated treatment well    Behavior During Therapy [facility] for tasks assessed/performed           Past Medical History:  Diagnosis Date  . Carpal tunnel syndrome   . Cerebellar ataxia (HCC)    spinal cerebellar ataxia  . Dyslipidemia   . H/O rotator cuff surgery   . Obstructive sleep apnea     Past Surgical History:  Procedure Laterality Date  . BYPASS GRAFT AORTA TO AORTA     quadruple bypass  . CARPAL TUNNEL RELEASE Left   . EYE SURGERY Bilateral   . SHOULDER SURGERY     bilateral  . TONSILLECTOMY    . VASECTOMY      There were no vitals filed for this visit.   Subjective Assessment - 08/12/20 1454    Subjective Pt reports he has been walking more in the house and driveway (8 revolutions) with the RW - feeling a little more steady.    Pertinent History 06/22/20 - R sided pelvic fracture s/p fall    Diagnostic tests 06/25/20 R hip MRI: positive for acute, nondisplaced fractures of the anterior wall and roof of the right acetabulum. There is also likely a nondisplaced fracture through the posterior aspect of the right inferior pubic ramus.  Mild right pectineus strain without tear.   06/26/20 CT pelvis: 1. The only fracture component well seen at CT is the anterior acetabular wall fracture, but based on MRI there is also an inferior   pubic ramus  fracture and some fracture extension into the superior acetabulum on the right. The appearance favors nondisplaced right anterior column fracture.   2. Low-grade edema along the right obturator internus and hip adductor musculature, better seen at MRI.   3. Prostatomegaly.   4. Sigmoid colon diverticulosis.   5. 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   6. Aortic atherosclerosis.    Patient Stated Goals "to improve my balance and strength in my legs"    Currently in Pain? No/denies              [facility] PT Assessment - 08/12/20 1450      Flexibility   Soft Tissue Assessment /Muscle Length yes    Hamstrings mod tight B    Quadriceps mild/mod tight B hip flexors & quads    ITB mod tight B    Piriformis mod tight B    Obturator Internus mod tight B                         [facility] Adult PT Treatment/Exercise - 08/12/20 1450      Exercises   Exercises Knee/Hip      Knee/Hip Exercises: Stretches  Passive Hamstring Stretch Right;Left;2 reps;30 seconds    Passive Hamstring Stretch Limitations supine with strap    Hip Flexor Stretch Right;Left;2 reps;30 seconds    Hip Flexor Stretch Limitations seated lunge position over edge of chair    ITB Stretch Right;Left;2 reps;30 seconds    ITB Stretch Limitations supine crossbody with strap    Piriformis Stretch Right;Left;2 reps;30 seconds   each   Piriformis Stretch Limitations supine KTOS & figure-4 with overpressure      Knee/Hip Exercises: Aerobic   Recumbent Bike L4 x 6 min      Knee/Hip Exercises: Standing   Hip Flexion Right;Left;10 reps;Stengthening;Knee bent    Hip Flexion Limitations 3#; UE support on counter    Hip ADduction Right;Left;10 reps;Strengthening    Hip ADduction Limitations 3#; UE support on counter    Hip Abduction Right;Left;10 reps;Stengthening;Knee straight    Abduction Limitations 3#; UE support on counter    Hip Extension  Right;Left;10 reps;Stengthening;Knee straight    Extension Limitations 3#; UE support on counter                  PT Education - 08/12/20 1532    Education Details HEP update: Access Code: [email or web address]    Person(s) Educated Patient    Methods Explanation;Demonstration;Verbal cues;Handout    Comprehension Verbalized understanding;Verbal cues required;Returned demonstration;Need further instruction            PT Short Term Goals - 08/12/20 1537      PT SHORT TERM GOAL #1   Title Patient will be independent with initial HEP    Status Achieved   08/12/20   Target Date 08/19/20      PT SHORT TERM GOAL #2   Title Patient will demonstrate safe transfer technique with RW to reduce fall risk    Status Partially Met   08/12/20 - Pt continues to require intermittent cueing for safe hand placement during transfers but reports good awareness of "nose over toes" forward weight shift   Target Date 08/19/20      PT SHORT TERM GOAL #3   Title Patient will demonstrate safe gait pattern >500 ft with RW on all surfaces to reduce risk for falls    Status On-going    Target Date 08/26/20      PT SHORT TERM GOAL #4   Title Patient will improve Berg score to >/= 38/56 to improve safety stability with ADLs in standing and reduce risk for falls    Status On-going    Target Date 08/26/20      PT SHORT TERM GOAL #5   Title Patient will safely negotiate stairs with handraill support to allow safe access to bedroom and home egress    Status On-going    Target Date 08/26/20             PT Long Term Goals - 08/12/20 1555      PT LONG TERM GOAL #1   Title Patient will be independent with ongoing/advanced HEP +/- gym program for self-management at home    Status On-going    Target Date 09/23/20      PT LONG TERM GOAL #2   Title Patient will demonstrate improved B LE strength to >/= 4+/5 for improved stability and ease of mobility    Status On-going    Target Date 09/23/20      PT LONG TERM  GOAL #3   Title Patient to demonstrate symmetrical step length, good foot clearance, and good heel-toe pattern with upright  posture when ambulating with SPC or walking poles    Status On-going    Target Date 09/23/20      PT LONG TERM GOAL #4   Title Patient will demonstrate decreased TUG time to </= 15 sec with LRAD to decrease risk for falls with transitional mobility    Status On-going    Target Date 09/23/20      PT LONG TERM GOAL #5   Title Patient will improve Berg score to >/= 45/56 to improve safety stability with ADLs in standing and reduce risk for falls    Status On-going    Target Date 09/23/20                 Plan - 08/12/20 1459    Clinical Impression Statement Akito able to verbalize good recall of initial HEP as well as ongoing HEP from SNF and personal exercise program and denies need for further review - STG #1 met. He continues to require intermittent cueing for safe hand placement during transfers but reports good awareness of "nose over toes" forward weight shift - STG #2 partially met. Pt noted to have mod proximal LE/hip musculature tightness therefore introduced stretching program to address identified tightness with HEP updated accordingly. Pt noting preference for use of cuff weights over theraband with HEP, therefore added standing 4-way hip with 3# cuff weights to promote proximal LE strengthening while increased SLS stability. Will continue to progress balance and stability activities in upcoming visits.    Comorbidities chronic spinal cerebellar ataxia, neuropathy, CAD s/p CABG x 4, Aflutter, osteopenia, B shoulder surgery, carpal tunnel release, OSA    Rehab Potential Good    PT Frequency 2x / week    PT Duration 8 weeks    PT Treatment/Interventions ADLs/Self Care Home Management;DME Instruction;Gait training;Stair training;Functional mobility training;Therapeutic activities;Therapeutic exercise;Balance training;Neuromuscular re-education;Patient/family  education;Manual techniques;Passive range of motion;Dry needling    PT Next Visit Plan hip flexibilty & LE strengthening    PT Home Exercise Plan MedBridge Access Codes: [id number] August 25, 2022);  [email or web address] (4/18)    Consulted and Agree with Plan of Care Patient           Patient will benefit from skilled therapeutic intervention in order to improve the following deficits and impairments:  Abnormal gait,Decreased activity tolerance,Decreased balance,Decreased coordination,Decreased endurance,Decreased knowledge of precautions,Decreased knowledge of use of DME,Decreased mobility,Decreased safety awareness,Decreased strength,Difficulty walking,Impaired perceived functional ability,Impaired flexibility,Improper body mechanics,Postural dysfunction  Visit Diagnosis: Unsteadiness on feet  Ataxic gait  Muscle weakness (generalized)  History of falling     Problem List Patient Active Problem List   Diagnosis Date Noted  . Neuropathy 12/12/2018  . Cerebellar ataxia ([place]) 12/12/2018  . Vitreous membranes and strands 10/20/2018  . Bilateral cold feet 04/07/2017  . Osteopenia of multiple sites 04/07/2017  . Vitamin D deficiency 04/07/2017  . CAD in native artery 03/09/2017  . Old MI (myocardial infarction) 03/09/2017  . Carpal tunnel syndrome, 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  . 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 PVC's (premature ventricular contractions) 10/09/2015  . Chronic dryness of both eyes 09/26/2015  . Hypermetropia 09/26/2015  . Presbyopia 09/26/2015  . Regular astigmatism 09/26/2015  . Hypermetropia of both eyes 09/26/2015  . Senile nuclear sclerosis 09/26/2015  . Stationary peripheral pterygium of right eye 09/26/2015  . Unspecified esotropia 09/26/2015  . Ventral hernia without obstruction or gangrene 07/30/2015  . Dyslipidemia 05/09/2015  . OSA (obstructive sleep apnea) 05/09/2015  . 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 Unspecified atrial  flutter ([place]) 05/09/2015     Percival Spanish, PT, MPT 08/12/2020, 4:11 PM  [facility] [address]  [address] [place], [place], [address] Phone: [phone]   Fax:  [phone]  Name: JAXON FLATT MRN: [id number] Date of Birth: 01/09/1941

## 2020-08-14 ENCOUNTER — Encounter: Payer: Medicare HMO | Admitting: Physical Therapy

## 2020-08-15 ENCOUNTER — Ambulatory Visit: Payer: Medicare HMO | Admitting: Physical Therapy

## 2020-08-15 ENCOUNTER — Encounter: Payer: Self-pay | Admitting: Physical Therapy

## 2020-08-15 ENCOUNTER — Other Ambulatory Visit: Payer: Self-pay

## 2020-08-15 DIAGNOSIS — R2681 Unsteadiness on feet: Secondary | ICD-10-CM | POA: Diagnosis not present

## 2020-08-15 DIAGNOSIS — Z9181 History of falling: Secondary | ICD-10-CM

## 2020-08-15 DIAGNOSIS — M6281 Muscle weakness (generalized): Secondary | ICD-10-CM

## 2020-08-15 DIAGNOSIS — R260 Ataxic gait: Secondary | ICD-10-CM

## 2020-08-15 NOTE — Therapy (Signed)
[facility] [address]  [place], [place], [address] Phone: [phone]   Fax:  [phone]  Physical Therapy Treatment  Patient Details  Name: Andre [name] MRN: [id number] Date of Birth: 12/31/1940 Referring Provider (PT): Merilynn Finland, MD (attending MD at [facility])   Encounter Date: 08/15/2020   PT End of Session - 08/15/20 1535    Visit Number 5    Number of Visits 16    Date for PT Re-Evaluation 09/23/20    Authorization Type [employer]etna Medicare    PT Start Time 3875    PT Stop Time 1617    PT Time Calculation (min) 42 min    Equipment Utilized During Treatment Gait belt    Activity Tolerance Patient tolerated treatment well    Behavior During Therapy [facility] for tasks assessed/performed           Past Medical History:  Diagnosis Date  . Carpal tunnel syndrome   . Cerebellar ataxia (HCC)    spinal cerebellar ataxia  . Dyslipidemia   . H/O rotator cuff surgery   . Obstructive sleep apnea     Past Surgical History:  Procedure Laterality Date  . BYPASS GRAFT AORTA TO AORTA     quadruple bypass  . CARPAL TUNNEL RELEASE Left   . EYE SURGERY Bilateral   . SHOULDER SURGERY     bilateral  . TONSILLECTOMY    . VASECTOMY      There were no vitals filed for this visit.   Subjective Assessment - 08/15/20 1538    Subjective Pt reports HEP is going good.    Pertinent History 06/22/20 - R sided pelvic fracture s/p fall    Diagnostic tests 06/25/20 R hip MRI: positive for acute, nondisplaced fractures of the anterior wall and roof of the right acetabulum. There is also likely a nondisplaced fracture through the posterior aspect of the right inferior pubic ramus.  Mild right pectineus strain without tear.   06/26/20 CT pelvis: 1. The only fracture component well seen at CT is the anterior acetabular wall fracture, but based on MRI there is also an inferior   pubic ramus fracture and some fracture extension into  the superior acetabulum on the right. The appearance favors nondisplaced right anterior column fracture.   2. Low-grade edema along the right obturator internus and hip adductor musculature, better seen at MRI.   3. Prostatomegaly.   4. Sigmoid colon diverticulosis.   5. 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   6. Aortic atherosclerosis.    Patient Stated Goals "to improve my balance and strength in my legs"    Currently in Pain? No/denies                             [facility] Adult PT Treatment/Exercise - 08/15/20 1535      Ambulation/Gait   Ambulation/Gait Assistance 4: Min guard;5: Supervision    Ambulation/Gait Assistance Details cues for RW proximity and contact with the floor esp while turning    Ambulation Distance (Feet) 450 Feet    Assistive device Rolling walker    Gait Pattern Step-through pattern;Ataxic    Ambulation Surface Level;Unlevel    Door Management 5: Supervision      Exercises   Exercises Knee/Hip      Knee/Hip Exercises: Aerobic   Recumbent Bike L4 x 6 min  Knee/Hip Exercises: Standing   Hip Extension Right;Left;10 reps;Knee bent    Extension Limitations alt step-backs with TRX support - cues to shift weight onto rear LE   CGA of PT   Functional Squat 15 reps;3 seconds    Functional Squat Limitations TRX + chair for safety   CGA of PT   Other Standing Knee Exercises Alt toe clears to 6" step 2 x 12 w/o UE support - 2nd set with 3# ankle cuff weights   CGA/min A of PT for balance, mod A x 1 for LOB correction   Other Standing Knee Exercises B side stepping with 3# ankle wts 3 x 10 with TRX support - initially with just lateral motion, 2nd set with B high step, final set with step-over 1/2 FR   CGA of PT with mod assist x 1 for LOB on final set                   PT Short Term Goals - 08/12/20 1537      PT SHORT TERM GOAL #1   Title Patient will be  independent with initial HEP    Status Achieved   08/12/20   Target Date 08/19/20      PT SHORT TERM GOAL #2   Title Patient will demonstrate safe transfer technique with RW to reduce fall risk    Status Partially Met   08/12/20 - Pt continues to require intermittent cueing for safe hand placement during transfers but reports good awareness of "nose over toes" forward weight shift   Target Date 08/19/20      PT SHORT TERM GOAL #3   Title Patient will demonstrate safe gait pattern >500 ft with RW on all surfaces to reduce risk for falls    Status On-going    Target Date 08/26/20      PT SHORT TERM GOAL #4   Title Patient will improve Berg score to >/= 38/56 to improve safety stability with ADLs in standing and reduce risk for falls    Status On-going    Target Date 08/26/20      PT SHORT TERM GOAL #5   Title Patient will safely negotiate stairs with handraill support to allow safe access to bedroom and home egress    Status On-going    Target Date 08/26/20             PT Long Term Goals - 08/12/20 1555      PT LONG TERM GOAL #1   Title Patient will be independent with ongoing/advanced HEP +/- gym program for self-management at home    Status On-going    Target Date 09/23/20      PT LONG TERM GOAL #2   Title Patient will demonstrate improved B LE strength to >/= 4+/5 for improved stability and ease of mobility    Status On-going    Target Date 09/23/20      PT LONG TERM GOAL #3   Title Patient to demonstrate symmetrical step length, good foot clearance, and good heel-toe pattern with upright posture when ambulating with SPC or walking poles    Status On-going    Target Date 09/23/20      PT LONG TERM GOAL #4   Title Patient will demonstrate decreased TUG time to </= 15 sec with LRAD to decrease risk for falls with transitional mobility    Status On-going    Target Date 09/23/20      PT LONG TERM GOAL #5  Title Patient will improve Berg score to >/= 45/56 to improve  safety stability with ADLs in standing and reduce risk for falls    Status On-going    Target Date 09/23/20                 Plan - 08/15/20 1540    Clinical Impression Statement Andre [name] reports no issues with latest HEP update and denies need for review today. Progressed therapeutic exercises to incorporate functional movement patterns with added resistance/weights for strengthening and control of ataxic movements with activities working on foot clearance for gait as well as lateral and posterior stepping strategies for stability in standing. PT providing CGA t/o all activities with pt experiencing a few LOB requiring up to mod assist of PT to prevent falls.    Comorbidities chronic spinal cerebellar ataxia, neuropathy, CAD s/p CABG x 4, Aflutter, osteopenia, B shoulder surgery, carpal tunnel release, OSA    Rehab Potential Good    PT Frequency 2x / week    PT Duration 8 weeks    PT Treatment/Interventions ADLs/Self Care Home Management;DME Instruction;Gait training;Stair training;Functional mobility training;Therapeutic activities;Therapeutic exercise;Balance training;Neuromuscular re-education;Patient/family education;Manual techniques;Passive range of motion;Dry needling    PT Next Visit Plan STG reassessment; hip flexibilty & LE strengthening - emphasis on balance and functional movement pattterns    PT Home Exercise Plan MedBridge Access Codes: [email or web address] Aug 14, 2022);  [email or web address] (4/18)    Consulted and Agree with Plan of Care Patient           Patient will benefit from skilled therapeutic intervention in order to improve the following deficits and impairments:  Abnormal gait,Decreased activity tolerance,Decreased balance,Decreased coordination,Decreased endurance,Decreased knowledge of precautions,Decreased knowledge of use of DME,Decreased mobility,Decreased safety awareness,Decreased strength,Difficulty walking,Impaired perceived functional ability,Impaired flexibility,Improper body  mechanics,Postural dysfunction  Visit Diagnosis: Unsteadiness on feet  Ataxic gait  Muscle weakness (generalized)  History of falling     Problem List Patient Active Problem List   Diagnosis Date Noted  . Neuropathy 12/12/2018  . Cerebellar ataxia ([place]) 12/12/2018  . Vitreous membranes and strands 10/20/2018  . Bilateral cold feet 04/07/2017  . Osteopenia of multiple sites 04/07/2017  . Vitamin D deficiency 04/07/2017  . CAD in native artery 03/09/2017  . Old MI (myocardial infarction) 03/09/2017  . Carpal tunnel syndrome, 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  . 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 PVC's (premature ventricular contractions) 10/09/2015  . Chronic dryness of both eyes 09/26/2015  . Hypermetropia 09/26/2015  . Presbyopia 09/26/2015  . Regular astigmatism 09/26/2015  . Hypermetropia of both eyes 09/26/2015  . Senile nuclear sclerosis 09/26/2015  . Stationary peripheral pterygium of right eye 09/26/2015  . Unspecified esotropia 09/26/2015  . Ventral hernia without obstruction or gangrene 07/30/2015  . Dyslipidemia 05/09/2015  . OSA (obstructive sleep apnea) 05/09/2015  . 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 Unspecified atrial flutter ([place]) 05/09/2015    Percival Spanish, PT, MPT 08/15/2020, 5:38 PM  [facility] [address]  [address] [facility], [place], [address] Phone: [phone]   Fax:  [phone]  Name: Andre [name] MRN: [id number] Date of Birth: [birth year]

## 2020-08-19 ENCOUNTER — Encounter: Payer: Self-pay | Admitting: Physical Therapy

## 2020-08-19 ENCOUNTER — Ambulatory Visit: Payer: Medicare HMO | Admitting: Physical Therapy

## 2020-08-19 ENCOUNTER — Other Ambulatory Visit: Payer: Self-pay

## 2020-08-19 DIAGNOSIS — Z9181 History of falling: Secondary | ICD-10-CM

## 2020-08-19 DIAGNOSIS — R2681 Unsteadiness on feet: Secondary | ICD-10-CM | POA: Diagnosis not present

## 2020-08-19 DIAGNOSIS — R260 Ataxic gait: Secondary | ICD-10-CM

## 2020-08-19 DIAGNOSIS — M6281 Muscle weakness (generalized): Secondary | ICD-10-CM

## 2020-08-19 NOTE — Therapy (Addendum)
[facility] [address]  [place], [place], [address] Phone: [phone]   Fax:  [phone]  Physical Therapy Treatment  Patient Details  Name: Andre [name] MRN: [id number] Date of Birth: [birth year] Referring Provider (PT): Merilynn Finland, MD (attending MD at [facility])   Encounter Date: 08/19/2020   PT End of Session - 08/19/20 1603    Visit Number 6    Number of Visits 16    Date for PT Re-Evaluation 09/23/20    Authorization Type Aetna Medicare    PT Start Time 9892    PT Stop Time 1539    PT Time Calculation (min) 46 min    Equipment Utilized During Treatment Gait belt    Activity Tolerance Patient tolerated treatment well    Behavior During Therapy [facility] for tasks assessed/performed           Past Medical History:  Diagnosis Date  . Carpal tunnel syndrome   . Cerebellar ataxia (HCC)    spinal cerebellar ataxia  . Dyslipidemia   . H/O rotator cuff surgery   . Obstructive sleep apnea     Past Surgical History:  Procedure Laterality Date  . BYPASS GRAFT AORTA TO AORTA     quadruple bypass  . CARPAL TUNNEL RELEASE Left   . EYE SURGERY Bilateral   . SHOULDER SURGERY     bilateral  . TONSILLECTOMY    . VASECTOMY      There were no vitals filed for this visit.   Subjective Assessment - 08/19/20 1457    Subjective Pt reports soreness from walking and doing 10 loops around the driveway.    Pertinent History 06/22/20 - R sided pelvic fracture s/p fall    Diagnostic tests 06/25/20 R hip MRI: positive for acute, nondisplaced fractures of the anterior wall and roof of the right acetabulum. There is also likely a nondisplaced fracture through the posterior aspect of the right inferior pubic ramus.  Mild right pectineus strain without tear.   06/26/20 CT pelvis: 1. The only fracture component well seen at CT is the anterior acetabular wall fracture, but based on MRI there is also an inferior   pubic ramus  fracture and some fracture extension into the superior acetabulum on the right. The appearance favors nondisplaced right anterior column fracture.   2. Low-grade edema along the right obturator internus and hip adductor musculature, better seen at MRI.   3. Prostatomegaly.   4. Sigmoid colon diverticulosis.   5. 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   6. Aortic atherosclerosis.    Patient Stated Goals "to improve my balance and strength in my legs"    Currently in Pain? No/denies                             [facility] Adult PT Treatment/Exercise - 08/19/20 1504      Ambulation/Gait   Ambulation/Gait Yes    Ambulation/Gait Assistance 5: Supervision;4: Min guard    Ambulation/Gait Assistance Details Cues for locking and unlocking rollator brakes for safety during transitions    Ambulation Distance (Feet) 1000 Feet   180 feet inside with rollator; 90 feet with SPC; Pt stopped for a break at 60 feet; Stopped for a break after outside ambulation   Assistive device 4-wheeled walker;Straight cane    Gait Pattern Step-through pattern;Ataxic    Ambulation Surface Level;Unlevel;Indoor;Outdoor;Grass;Paved  Curb 5: Supervision    Curb Details (indicate cue type and reason) Cue for bringing 4 wheel walker slowly to edge, lowering two wheels then next two; cuing to lock the walker before stepping down the curb.    Gait Comments Patient able to demonstrate safe gait with 4WW/rollator on most surfaces but required cues for proximity with walking in the grass.      Exercises   Exercises Knee/Hip      Knee/Hip Exercises: Aerobic   Nustep L4 x 6      Knee/Hip Exercises: Standing   Heel Raises Both;20 reps    Heel Raises Limitations 2 HHA at counter; Toe raises x 20 at the counter w/ 2 HHA    Functional Squat 1 set;15 reps    Functional Squat Limitations 2 HHA; Chair behind for safety               Balance  Exercises - 08/19/20 0001      Balance Exercises: Standing   Tandem Stance Upper extremity support 2;1 rep;15 secs               PT Short Term Goals - 08/19/20 1600      PT SHORT TERM GOAL #1   Title Patient will be independent with initial HEP    Status Achieved   08/12/20   Target Date 08/19/20      PT SHORT TERM GOAL #2   Title Patient will demonstrate safe transfer technique with RW to reduce fall risk    Status Partially Met   08/12/20 - Pt continues to require intermittent cueing for safe hand placement during transfers but reports good awareness of "nose over toes" forward weight shift   Target Date 08/19/20      PT SHORT TERM GOAL #3   Title Patient will demonstrate safe gait pattern >500 ft with RW on all surfaces to reduce risk for falls    Status Partially Met   Navigation on grass was not with a safe gait pattern 08/19/2020   Target Date 08/26/20      PT SHORT TERM GOAL #4   Title Patient will improve Berg score to >/= 38/56 to improve safety stability with ADLs in standing and reduce risk for falls    Status On-going    Target Date 08/26/20      PT SHORT TERM GOAL #5   Title Patient will safely negotiate stairs with handraill support to allow safe access to bedroom and home egress    Status On-going    Target Date 08/26/20             PT Long Term Goals - 08/12/20 1555      PT LONG TERM GOAL #1   Title Patient will be independent with ongoing/advanced HEP +/- gym program for self-management at home    Status On-going    Target Date 09/23/20      PT LONG TERM GOAL #2   Title Patient will demonstrate improved B LE strength to >/= 4+/5 for improved stability and ease of mobility    Status On-going    Target Date 09/23/20      PT LONG TERM GOAL #3   Title Patient to demonstrate symmetrical step length, good foot clearance, and good heel-toe pattern with upright posture when ambulating with SPC or walking poles    Status On-going    Target Date 09/23/20       PT LONG TERM GOAL #4   Title Patient will demonstrate  decreased TUG time to </= 15 sec with LRAD to decrease risk for falls with transitional mobility    Status On-going    Target Date 09/23/20      PT LONG TERM GOAL #5   Title Patient will improve Berg score to >/= 45/56 to improve safety stability with ADLs in standing and reduce risk for falls    Status On-going    Target Date 09/23/20                 Plan - 08/19/20 1606    Clinical Impression Statement Alexandre reports feeling some soreness following walking around his driveway this past weekend. He is doing well with the HEP and splits up days of stretching from days of strengthening. He is progressing to achieving STG's 2 and 3 but still requires cuing for safe transfers to RW and needs practice with gait on uneven surfaces using rollator. He also required cuing for correct technique of using a 4 wheel walker when ascending and descending curbs. He will continue to benefit from skilled PT to improve gait mechanics and transfers using assistive devices and improving balance for fall prevention.    Comorbidities chronic spinal cerebellar ataxia, neuropathy, CAD s/p CABG x 4, Aflutter, osteopenia, B shoulder surgery, carpal tunnel release, OSA    Rehab Potential Good    PT Frequency 2x / week    PT Duration 8 weeks    PT Treatment/Interventions ADLs/Self Care Home Management;DME Instruction;Gait training;Stair training;Functional mobility training;Therapeutic activities;Therapeutic exercise;Balance training;Neuromuscular re-education;Patient/family education;Manual techniques;Passive range of motion;Dry needling    PT Next Visit Plan hip flexibilty & LE strengthening - emphasis on balance and functional movement pattterns    PT Home Exercise Plan MedBridge Access Codes: [email or web address] 08/13/22);  [email or web address] (4/18)    Consulted and Agree with Plan of Care Patient           Patient will benefit from skilled therapeutic intervention in  order to improve the following deficits and impairments:  Abnormal gait,Decreased activity tolerance,Decreased balance,Decreased coordination,Decreased endurance,Decreased knowledge of precautions,Decreased knowledge of use of DME,Decreased mobility,Decreased safety awareness,Decreased strength,Difficulty walking,Impaired perceived functional ability,Impaired flexibility,Improper body mechanics,Postural dysfunction  Visit Diagnosis: Unsteadiness on feet  Ataxic gait  Muscle weakness (generalized)  History of falling     Problem List Patient Active Problem List   Diagnosis Date Noted  . Neuropathy 12/12/2018  . Cerebellar ataxia ([place]) 12/12/2018  . Vitreous membranes and strands 10/20/2018  . Bilateral cold feet 04/07/2017  . Osteopenia of multiple sites 04/07/2017  . Vitamin D deficiency 04/07/2017  . CAD in native artery 03/09/2017  . Old MI (myocardial infarction) 03/09/2017  . Carpal tunnel syndrome, 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  . 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 PVC's (premature ventricular contractions) 10/09/2015  . Chronic dryness of both eyes 09/26/2015  . Hypermetropia 09/26/2015  . Presbyopia 09/26/2015  . Regular astigmatism 09/26/2015  . Hypermetropia of both eyes 09/26/2015  . Senile nuclear sclerosis 09/26/2015  . Stationary peripheral pterygium of right eye 09/26/2015  . Unspecified esotropia 09/26/2015  . Ventral hernia without obstruction or gangrene 07/30/2015  . Dyslipidemia 05/09/2015  . OSA (obstructive sleep apnea) 05/09/2015  . 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 Unspecified atrial flutter ([place]) 05/09/2015    JoAnne Nolon Rod SPT 08/19/2020, 6:06 PM  [facility] [address]  [place], [place], [address] Phone: [phone]   Fax:  [phone]  Name: HARDING THOMURE MRN: [id number] Date of  Birth: 1940-05-20

## 2020-08-21 ENCOUNTER — Other Ambulatory Visit: Payer: Self-pay

## 2020-08-21 ENCOUNTER — Ambulatory Visit: Payer: Medicare HMO | Admitting: Physical Therapy

## 2020-08-21 ENCOUNTER — Encounter: Payer: Self-pay | Admitting: Physical Therapy

## 2020-08-21 DIAGNOSIS — M6281 Muscle weakness (generalized): Secondary | ICD-10-CM

## 2020-08-21 DIAGNOSIS — R260 Ataxic gait: Secondary | ICD-10-CM

## 2020-08-21 DIAGNOSIS — R2681 Unsteadiness on feet: Secondary | ICD-10-CM | POA: Diagnosis not present

## 2020-08-21 DIAGNOSIS — Z9181 History of falling: Secondary | ICD-10-CM

## 2020-08-21 NOTE — Patient Instructions (Signed)
   Access Code: [facility] URL: [email or web address] Date: 08/21/2020 Prepared by: Glenetta Hew  Exercises Seated Shoulder Row with Anchored Resistance - 1 x daily - 7 x weekly - 2 sets - 10 reps - 3-5 hold hold Seated Shoulder Extension and Scapular Retraction with Resistance - 1 x daily - 7 x weekly - 2 sets - 10 reps - 3-5 sec hold

## 2020-08-21 NOTE — Therapy (Signed)
[facility] [address]  [place], [place], [address] Phone: [phone]   Fax:  [phone]  Physical Therapy Treatment  Patient Details  Name: Andre [name] MRN: [id number] Date of Birth: [birth year] Referring Provider (PT): Merilynn Finland, MD (attending MD at [facility])   Encounter Date: 08/21/2020   PT End of Session - 08/21/20 1150    Visit Number 7    Number of Visits 16    Date for PT Re-Evaluation 09/23/20    Authorization Type [employer]na Medicare    PT Start Time 1150    PT Stop Time 1234    PT Time Calculation (min) 44 min    Equipment Utilized During Treatment --    Activity Tolerance Patient tolerated treatment well    Behavior During Therapy [facility] for tasks assessed/performed           Past Medical History:  Diagnosis Date  . Carpal tunnel syndrome   . Cerebellar ataxia (HCC)    spinal cerebellar ataxia  . Dyslipidemia   . H/O rotator cuff surgery   . Obstructive sleep apnea     Past Surgical History:  Procedure Laterality Date  . BYPASS GRAFT AORTA TO AORTA     quadruple bypass  . CARPAL TUNNEL RELEASE Left   . EYE SURGERY Bilateral   . SHOULDER SURGERY     bilateral  . TONSILLECTOMY    . VASECTOMY      There were no vitals filed for this visit.   Subjective Assessment - 08/21/20 1153    Subjective Pt reports he always feels good when he leaves PT - feels like he has done something. He does note a h/o shoulder issues which he feels may have been exacerbated by his fall and having to use the RW more.    Pertinent History 06/22/20 - R sided pelvic fracture s/p fall    Diagnostic tests 06/25/20 R hip MRI: positive for acute, nondisplaced fractures of the anterior wall and roof of the right acetabulum. There is also likely a nondisplaced fracture through the posterior aspect of the right inferior pubic ramus.  Mild right pectineus strain without tear.   06/26/20 CT pelvis: 1. The only fracture  component well seen at CT is the anterior acetabular wall fracture, but based on MRI there is also an inferior   pubic ramus fracture and some fracture extension into the superior acetabulum on the right. The appearance favors nondisplaced right anterior column fracture.   2. Low-grade edema along the right obturator internus and hip adductor musculature, better seen at MRI.   3. Prostatomegaly.   4. Sigmoid colon diverticulosis.   5. 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   6. Aortic atherosclerosis.    Patient Stated Goals "to improve my balance and strength in my legs"              [facility] PT Assessment - 08/21/20 1150      AROM   Overall AROM Comments R shoulder painful at end ROM    Right Shoulder Flexion 136 Degrees    Right Shoulder ABduction 137 Degrees    Right Shoulder Internal Rotation --   FIR to L1   Right Shoulder External Rotation --   FER to T2   Left Shoulder Flexion 147 Degrees    Left Shoulder ABduction 142 Degrees    Left Shoulder Internal Rotation --   FIR to T10  Left Shoulder External Rotation --   FER to T2     Strength   Right Shoulder Flexion 4/5    Right Shoulder ABduction 4/5    Right Shoulder Internal Rotation 4/5    Right Shoulder External Rotation 4-/5    Left Shoulder Flexion 4-/5    Left Shoulder ABduction 4-/5    Left Shoulder Internal Rotation 4/5    Left Shoulder External Rotation 3+/5                         [facility] Adult PT Treatment/Exercise - 08/21/20 1150      Exercises   Exercises Knee/Hip      Knee/Hip Exercises: Aerobic   Nustep L4 x 6      Shoulder Exercises: Seated   Extension Both;10 reps;Strengthening;Theraband    Theraband Level (Shoulder Extension) Level 2 (Red)    Extension Limitations + scap retraction; cues for abdonimal activation for core stability    Row Both;10 reps;Strengthening;Theraband   2 sets   Theraband Level (Shoulder  Row) Level 2 (Red)    Row Limitations + scap retraction; cues for abdonimal activation for core stability - 1st set with wide feet BOS, 2nd set with narrow BOS               Balance Exercises - 08/21/20 1150      Balance Exercises: Standing   Standing Eyes Opened Narrow base of support (BOS);Solid surface;Wide (BOA);Foam/compliant surface;30 secs;Head turns;5 reps    Standing Eyes Closed Narrow base of support (BOS);Solid surface;Wide (BOA);Foam/compliant surface;10 secs;3 reps    Partial Tandem Stance Eyes open;20 secs;3 reps;Intermittent upper extremity support    Other Standing Exercises Comments all balance exercises performed in corner with back of chair facing pt for intermittent UE support as needed             PT Education - 08/21/20 1234    Education Details HEP update - scapular/postural strengthening - Access Code: [facility]    Person(s) Educated Patient    Methods Explanation;Demonstration;Verbal cues;Handout    Comprehension Verbalized understanding;Verbal cues required;Returned demonstration;Need further instruction            PT Short Term Goals - 08/19/20 1600      PT SHORT TERM GOAL #1   Title Patient will be independent with initial HEP    Status Achieved   08/12/20   Target Date 08/19/20      PT SHORT TERM GOAL #2   Title Patient will demonstrate safe transfer technique with RW to reduce fall risk    Status Partially Met   08/12/20 - Pt continues to require intermittent cueing for safe hand placement during transfers but reports good awareness of "nose over toes" forward weight shift   Target Date 08/19/20      PT SHORT TERM GOAL #3   Title Patient will demonstrate safe gait pattern >500 ft with RW on all surfaces to reduce risk for falls    Status Partially Met   Navigation on grass was not with a safe gait pattern 08/19/2020   Target Date 08/26/20      PT SHORT TERM GOAL #4   Title Patient will improve Berg score to >/= 38/56 to improve safety  stability with ADLs in standing and reduce risk for falls    Status On-going    Target Date 08/26/20      PT SHORT TERM GOAL #5   Title Patient will safely negotiate stairs with handraill  support to allow safe access to bedroom and home egress    Status On-going    Target Date 08/26/20             PT Long Term Goals - 08/12/20 1555      PT LONG TERM GOAL #1   Title Patient will be independent with ongoing/advanced HEP +/- gym program for self-management at home    Status On-going    Target Date 09/23/20      PT LONG TERM GOAL #2   Title Patient will demonstrate improved B LE strength to >/= 4+/5 for improved stability and ease of mobility    Status On-going    Target Date 09/23/20      PT LONG TERM GOAL #3   Title Patient to demonstrate symmetrical step length, good foot clearance, and good heel-toe pattern with upright posture when ambulating with SPC or walking poles    Status On-going    Target Date 09/23/20      PT LONG TERM GOAL #4   Title Patient will demonstrate decreased TUG time to </= 15 sec with LRAD to decrease risk for falls with transitional mobility    Status On-going    Target Date 09/23/20      PT LONG TERM GOAL #5   Title Patient will improve Berg score to >/= 45/56 to improve safety stability with ADLs in standing and reduce risk for falls    Status On-going    Target Date 09/23/20                 Plan - 08/21/20 1234    Clinical Impression Statement Andre [name] reports he has noticed increased issues with his shoulders since his fall (he reports he has had RTC problems in the past. B shoulder AROM WFL but R limited as compared to L and mild to moderate weakness noted L>R. Since he has been using the walkers more for mobility he demonstrates more forward/rounded shoulder posture, therefore introduced scapular/postural strengthening while also encouraging core stability with HEP updated accordingly. Progressed balance training utilizing corner balance  activity progression for increased safety as pt notes he currently tries to work on his balance at the kitchen counter in his home. He will continue to benefit from skilled PT for further core and UE/LE strengthening as well as balance training to improve posture and stability to reduce risk for future falls.    Comorbidities chronic spinal cerebellar ataxia, neuropathy, CAD s/p CABG x 4, Aflutter, osteopenia, B shoulder surgery, carpal tunnel release, OSA    Rehab Potential Good    PT Frequency 2x / week    PT Duration 8 weeks    PT Treatment/Interventions ADLs/Self Care Home Management;DME Instruction;Gait training;Stair training;Functional mobility training;Therapeutic activities;Therapeutic exercise;Balance training;Neuromuscular re-education;Patient/family education;Manual techniques;Passive range of motion;Dry needling    PT Next Visit Plan hip flexibilty & LE strengthening - emphasis on balance and functional movement pattterns; core/postural strengthening; balance training    PT Home Exercise Plan MedBridge Access Codes: [email or web address] 08-09-2022);  [id number] (4/18);  [email or web address] (4/27)    Consulted and Agree with Plan of Care Patient           Patient will benefit from skilled therapeutic intervention in order to improve the following deficits and impairments:  Abnormal gait,Decreased activity tolerance,Decreased balance,Decreased coordination,Decreased endurance,Decreased knowledge of precautions,Decreased knowledge of use of DME,Decreased mobility,Decreased safety awareness,Decreased strength,Difficulty walking,Impaired perceived functional ability,Impaired flexibility,Improper body mechanics,Postural dysfunction  Visit Diagnosis: Unsteadiness on feet  Ataxic gait  Muscle weakness (generalized)  History of falling     Problem List Patient Active Problem List   Diagnosis Date Noted  . Neuropathy 12/12/2018  . Cerebellar ataxia ([place]) 12/12/2018  . Vitreous membranes and strands 10/20/2018   . Bilateral cold feet 04/07/2017  . Osteopenia of multiple sites 04/07/2017  . Vitamin D deficiency 04/07/2017  . CAD in native artery 03/09/2017  . Old MI (myocardial infarction) 03/09/2017  . Carpal tunnel syndrome, 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  . 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 PVC's (premature ventricular contractions) 10/09/2015  . Chronic dryness of both eyes 09/26/2015  . Hypermetropia 09/26/2015  . Presbyopia 09/26/2015  . Regular astigmatism 09/26/2015  . Hypermetropia of both eyes 09/26/2015  . Senile nuclear sclerosis 09/26/2015  . Stationary peripheral pterygium of right eye 09/26/2015  . Unspecified esotropia 09/26/2015  . Ventral hernia without obstruction or gangrene 07/30/2015  . Dyslipidemia 05/09/2015  . OSA (obstructive sleep apnea) 05/09/2015  . 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 Unspecified atrial flutter ([place]) 05/09/2015    Percival Spanish, PT, MPT 08/21/2020, 1:03 PM  [facility] [address]  [place], [place], [address] Phone: [phone]   Fax:  [phone]  Name: Andre [name] MRN: [id number] Date of Birth: Mar 20, 1941

## 2020-08-26 ENCOUNTER — Other Ambulatory Visit: Payer: Self-pay

## 2020-08-26 ENCOUNTER — Ambulatory Visit: Payer: Medicare HMO | Attending: Internal Medicine

## 2020-08-26 DIAGNOSIS — M6281 Muscle weakness (generalized): Secondary | ICD-10-CM | POA: Diagnosis present

## 2020-08-26 DIAGNOSIS — Z9181 History of falling: Secondary | ICD-10-CM | POA: Insufficient documentation

## 2020-08-26 DIAGNOSIS — R2681 Unsteadiness on feet: Secondary | ICD-10-CM | POA: Insufficient documentation

## 2020-08-26 DIAGNOSIS — R260 Ataxic gait: Secondary | ICD-10-CM | POA: Diagnosis present

## 2020-08-26 NOTE — Therapy (Signed)
[facility] [address]  [place], [place], [address] Phone: [phone]   Fax:  [phone]  Physical Therapy Treatment  Patient Details  Name: Andre [name] MRN: [id number] Date of Birth: May 03, 1940 Referring Provider (PT): Merilynn Finland, MD (attending MD at [facility])   Encounter Date: 08/26/2020   PT End of Session - 08/26/20 1526    Visit Number 8    Number of Visits 16    Date for PT Re-Evaluation 09/23/20    Authorization Type Aetna Medicare    PT Start Time 4097    PT Stop Time 1527    PT Time Calculation (min) 38 min    Activity Tolerance Patient tolerated treatment well    Behavior During Therapy [facility] for tasks assessed/performed           Past Medical History:  Diagnosis Date  . Carpal tunnel syndrome   . Cerebellar ataxia (HCC)    spinal cerebellar ataxia  . Dyslipidemia   . H/O rotator cuff surgery   . Obstructive sleep apnea     Past Surgical History:  Procedure Laterality Date  . BYPASS GRAFT AORTA TO AORTA     quadruple bypass  . CARPAL TUNNEL RELEASE Left   . EYE SURGERY Bilateral   . SHOULDER SURGERY     bilateral  . TONSILLECTOMY    . VASECTOMY      There were no vitals filed for this visit.   Subjective Assessment - 08/26/20 1450    Subjective Pt reports his balance is getting better during the day.    Pertinent History 06/22/20 - R sided pelvic fracture s/p fall    Diagnostic tests 06/25/20 R hip MRI: positive for acute, nondisplaced fractures of the anterior wall and roof of the right acetabulum. There is also likely a nondisplaced fracture through the posterior aspect of the right inferior pubic ramus.  Mild right pectineus strain without tear.   06/26/20 CT pelvis: 1. The only fracture component well seen at CT is the anterior acetabular wall fracture, but based on MRI there is also an inferior   pubic ramus fracture and some fracture extension into the superior acetabulum  on the right. The appearance favors nondisplaced right anterior column fracture.   2. Low-grade edema along the right obturator internus and hip adductor musculature, better seen at MRI.   3. Prostatomegaly.   4. Sigmoid colon diverticulosis.   5. 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   6. Aortic atherosclerosis.    Patient Stated Goals "to improve my balance and strength in my legs"    Currently in Pain? No/denies                             [facility] Adult PT Treatment/Exercise - 08/26/20 0001      Exercises   Exercises Knee/Hip      Knee/Hip Exercises: Aerobic   Recumbent Bike L3x69mn      Knee/Hip Exercises: Standing   Hip Abduction Stengthening;Both;1 set;10 reps    Abduction Limitations R Tband    Hip Extension Stengthening;Both;1 set;10 reps;Knee straight    Extension Limitations R Tband    Forward Step Up Both;2 sets;10 reps;Hand Hold: 2;Step Height: 6"    Forward Step Up Limitations 2 HHA    Functional Squat 1 set;10 reps    Functional Squat Limitations 2 HHA at treadmill  Knee/Hip Exercises: Seated   Sit to Sand 1 set;10 reps;without UE support      Shoulder Exercises: Seated   Extension Strengthening;Both;Theraband;20 reps    Theraband Level (Shoulder Extension) Level 2 (Red)    Extension Limitations + scap retraction; ;last 10 reps with airex pad and feet close together    [employer];Theraband;20 reps    Theraband Level (Shoulder Row) Level 2 (Red)    Row Limitations + scap retraction; last 10 reps with airex pad and feet close together                    PT Short Term Goals - 08/19/20 1600      PT SHORT TERM GOAL #1   Title Patient will be independent with initial HEP    Status Achieved   08/12/20   Target Date 08/19/20      PT SHORT TERM GOAL #2   Title Patient will demonstrate safe transfer technique with RW to reduce fall risk    Status  Partially Met   08/12/20 - Pt continues to require intermittent cueing for safe hand placement during transfers but reports good awareness of "nose over toes" forward weight shift   Target Date 08/19/20      PT SHORT TERM GOAL #3   Title Patient will demonstrate safe gait pattern >500 ft with RW on all surfaces to reduce risk for falls    Status Partially Met   Navigation on grass was not with a safe gait pattern 08/19/2020   Target Date 08/26/20      PT SHORT TERM GOAL #4   Title Patient will improve Berg score to >/= 38/56 to improve safety stability with ADLs in standing and reduce risk for falls    Status On-going    Target Date 08/26/20      PT SHORT TERM GOAL #5   Title Patient will safely negotiate stairs with handraill support to allow safe access to bedroom and home egress    Status On-going    Target Date 08/26/20             PT Long Term Goals - 08/12/20 1555      PT LONG TERM GOAL #1   Title Patient will be independent with ongoing/advanced HEP +/- gym program for self-management at home    Status On-going    Target Date 09/23/20      PT LONG TERM GOAL #2   Title Patient will demonstrate improved B LE strength to >/= 4+/5 for improved stability and ease of mobility    Status On-going    Target Date 09/23/20      PT LONG TERM GOAL #3   Title Patient to demonstrate symmetrical step length, good foot clearance, and good heel-toe pattern with upright posture when ambulating with SPC or walking poles    Status On-going    Target Date 09/23/20      PT LONG TERM GOAL #4   Title Patient will demonstrate decreased TUG time to </= 15 sec with LRAD to decrease risk for falls with transitional mobility    Status On-going    Target Date 09/23/20      PT LONG TERM GOAL #5   Title Patient will improve Berg score to >/= 45/56 to improve safety stability with ADLs in standing and reduce risk for falls    Status On-going    Target Date 09/23/20  Plan  - 08/26/20 1534    Clinical Impression Statement Pt reports not much problems with shoulders today. Also reported improvements in balance. Focused on LE strength today in standing and core activation in seated positions. Pt required cues to keep good position of the feet to avoid too much weight put through the heels and LOB during STS. He showed good stability with the standing exercises with hand assistance and needed cueing to prevent bracing onto UEs. Also needed cueing to keep elbows at 90 deg during rows and squeeze shoulder blades. No complaints during session, pt responded well.    Personal Factors and Comorbidities Comorbidity 3+;Age;Past/Current Experience;Time since onset of injury/illness/exacerbation    Comorbidities chronic spinal cerebellar ataxia, neuropathy, CAD s/p CABG x 4, Aflutter, osteopenia, B shoulder surgery, carpal tunnel release, OSA    PT Frequency 2x / week    PT Duration 8 weeks    PT Treatment/Interventions ADLs/Self Care Home Management;DME Instruction;Gait training;Stair training;Functional mobility training;Therapeutic activities;Therapeutic exercise;Balance training;Neuromuscular re-education;Patient/family education;Manual techniques;Passive range of motion;Dry needling    PT Next Visit Plan Assess STG; hip flexibilty & LE strengthening - emphasis on balance and functional movement pattterns; core/postural strengthening; balance training    PT Home Exercise Plan MedBridge Access Codes: [email or web address] Aug 24, 2022);  [email or web address] (4/18);  [email or web address] (4/27)    Consulted and Agree with Plan of Care Patient           Patient will benefit from skilled therapeutic intervention in order to improve the following deficits and impairments:  Abnormal gait,Decreased activity tolerance,Decreased balance,Decreased coordination,Decreased endurance,Decreased knowledge of precautions,Decreased knowledge of use of DME,Decreased mobility,Decreased safety awareness,Decreased strength,Difficulty  walking,Impaired perceived functional ability,Impaired flexibility,Improper body mechanics,Postural dysfunction  Visit Diagnosis: Unsteadiness on feet  Ataxic gait  Muscle weakness (generalized)  History of falling     Problem List Patient Active Problem List   Diagnosis Date Noted  . Neuropathy 12/12/2018  . Cerebellar ataxia ([place]) 12/12/2018  . Vitreous membranes and strands 10/20/2018  . Bilateral cold feet 04/07/2017  . Osteopenia of multiple sites 04/07/2017  . Vitamin D deficiency 04/07/2017  . CAD in native artery 03/09/2017  . Old MI (myocardial infarction) 03/09/2017  . Carpal tunnel syndrome, 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  . 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 PVC's (premature ventricular contractions) 10/09/2015  . Chronic dryness of both eyes 09/26/2015  . Hypermetropia 09/26/2015  . Presbyopia 09/26/2015  . Regular astigmatism 09/26/2015  . Hypermetropia of both eyes 09/26/2015  . Senile nuclear sclerosis 09/26/2015  . Stationary peripheral pterygium of right eye 09/26/2015  . Unspecified esotropia 09/26/2015  . Ventral hernia without obstruction or gangrene 07/30/2015  . Dyslipidemia 05/09/2015  . OSA (obstructive sleep apnea) 05/09/2015  . 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 Unspecified atrial flutter ([place]) 05/09/2015    Artist Pais, PTA 08/26/2020, 4:20 PM  [facility] [address]  [place], [place], [address] Phone: [phone]   Fax:  [phone]  Name: ZYGMUNT MCGLINN MRN: [id number] Date of Birth: [birth year]

## 2020-08-28 ENCOUNTER — Ambulatory Visit: Payer: Medicare HMO | Admitting: Physical Therapy

## 2020-08-28 ENCOUNTER — Other Ambulatory Visit: Payer: Self-pay

## 2020-08-28 ENCOUNTER — Encounter: Payer: Self-pay | Admitting: Physical Therapy

## 2020-08-28 DIAGNOSIS — Z9181 History of falling: Secondary | ICD-10-CM

## 2020-08-28 DIAGNOSIS — R2681 Unsteadiness on feet: Secondary | ICD-10-CM | POA: Diagnosis not present

## 2020-08-28 DIAGNOSIS — M6281 Muscle weakness (generalized): Secondary | ICD-10-CM

## 2020-08-28 DIAGNOSIS — R260 Ataxic gait: Secondary | ICD-10-CM

## 2020-08-28 NOTE — Therapy (Signed)
[facility] [address]  [place], [place], [address] Phone: [phone]   Fax:  [phone]  Physical Therapy Treatment / Progress Note  Patient Details  Name: Andre [name] MRN: [id number] Date of Birth: 09-Apr-1941 Referring Provider (PT): Merilynn Finland, MD Jefm Petty, MD (PCP))  Progress Note  Reporting Period 07/29/2020 to 08/28/2020  See note below for Objective Data and Assessment of Progress/Goals.      Encounter Date: 08/28/2020   PT End of Session - 08/28/20 1148    Visit Number 9    Number of Visits 16    Date for PT Re-Evaluation 09/23/20    Authorization Type [employer]    Progress Note Due on Visit --   end of POC - PN on visit #9   PT Start Time 1148    PT Stop Time 1230    PT Time Calculation (min) 42 min    Equipment Utilized During Treatment Gait belt    Activity Tolerance Patient tolerated treatment well    Behavior During Therapy WFL for tasks assessed/performed           Past Medical History:  Diagnosis Date  . Carpal tunnel syndrome   . Cerebellar ataxia (HCC)    spinal cerebellar ataxia  . Dyslipidemia   . H/O rotator cuff surgery   . Obstructive sleep apnea     Past Surgical History:  Procedure Laterality Date  . BYPASS GRAFT AORTA TO AORTA     quadruple bypass  . CARPAL TUNNEL RELEASE Left   . EYE SURGERY Bilateral   . SHOULDER SURGERY     bilateral  . TONSILLECTOMY    . VASECTOMY      There were no vitals filed for this visit.       [facility] PT Assessment - 08/28/20 1148      Assessment   Medical Diagnosis Unsteadiness on feet s/p fall with R-sided pelvic fracture    Referring Provider (PT) Merilynn Finland, MD   Jefm Petty, MD (PCP)   Next MD Visit 11/06/20 - Dr. Jeralene Huff      Ambulation/Gait   Stairs Yes    Stairs Assistance 4: Min guard;5: Supervision    Stair Management Technique One rail Left;Alternating pattern;Forwards   2 hands to rail (pt  normally uses B rail at home but rails to far apart at clinic stair)   Number of Stairs 14    Height of Stairs 7      Berg Balance Test   Sit to Stand Able to stand without using hands and stabilize independently    Standing Unsupported Able to stand safely 2 minutes    Sitting with Back Unsupported but Feet Supported on Floor or Stool Able to sit safely and securely 2 minutes    Stand to Sit Sits safely with minimal use of hands    Transfers Able to transfer safely, definite need of hands    Standing Unsupported with Eyes Closed Able to stand 10 seconds with supervision    Standing Unsupported with Feet Together Able to place feet together independently and stand for 1 minute with supervision    From Standing, Reach Forward with Outstretched Arm Can reach forward >12 cm safely (5")    From Standing Position, Pick up Object from Floor Able to pick up shoe, needs supervision    From Standing Position, Turn to Look Behind Over each Shoulder Turn sideways only but maintains balance    Turn 360  Degrees Needs close supervision or verbal cueing    Standing Unsupported, Alternately Place Feet on Step/Stool Able to complete 4 steps without aid or supervision    Standing Unsupported, One Foot in Front Able to take small step independently and hold 30 seconds    Standing on One Leg Tries to lift leg/unable to hold 3 seconds but remains standing independently    Total Score 39    Berg comment: 37-45 = Significant (>80%) fall risk      Timed Up and Go Test   Normal TUG (seconds) 19.12   with RW                        [facility] Adult PT Treatment/Exercise - 08/28/20 1148      Ambulation/Gait   Ambulation/Gait Assistance 4: Min guard    Ambulation/Gait Assistance Details cues for more anterior placement of hiking poles    Ambulation Distance (Feet) 480 Feet    Assistive device --   B hiking poles   Gait Pattern Step-through pattern;Ataxic   signifcant lateral sway/deviation    Ambulation Surface Level;Indoor    Gait Comments Pt having difficulty acheiving coordinated rhythm with hiking poles.      Exercises   Exercises Knee/Hip      Knee/Hip Exercises: Aerobic   Nustep L5 x 6 min                    PT Short Term Goals - 08/28/20 1153      PT SHORT TERM GOAL #1   Title Patient will be independent with initial HEP    Status Achieved   08/12/20   Target Date 08/19/20      PT SHORT TERM GOAL #2   Title Patient will demonstrate safe transfer technique with RW to reduce fall risk    Status Achieved   08/28/20 - Pt better aware of safe hand placement during transfers and will self-correct if he tries to reach for the RW   Target Date 08/19/20      PT SHORT TERM GOAL #3   Title Patient will demonstrate safe gait pattern >500 ft with RW on all surfaces to reduce risk for falls    Status Achieved   08/28/20   Target Date 08/26/20      PT SHORT TERM GOAL #4   Title Patient will improve Berg score to >/= 38/56 to improve safety stability with ADLs in standing and reduce risk for falls    Status Achieved   08/28/20   Target Date 08/26/20      PT SHORT TERM GOAL #5   Title Patient will safely negotiate stairs with handraill support to allow safe access to bedroom and home egress    Status Achieved   08/28/20   Target Date 08/26/20             PT Long Term Goals - 08/12/20 1555      PT LONG TERM GOAL #1   Title Patient will be independent with ongoing/advanced HEP +/- gym program for self-management at home    Status On-going    Target Date 09/23/20      PT LONG TERM GOAL #2   Title Patient will demonstrate improved B LE strength to >/= 4+/5 for improved stability and ease of mobility    Status On-going    Target Date 09/23/20      PT LONG TERM GOAL #3   Title Patient to demonstrate symmetrical  step length, good foot clearance, and good heel-toe pattern with upright posture when ambulating with SPC or walking poles    Status On-going    Target  Date 09/23/20      PT LONG TERM GOAL #4   Title Patient will demonstrate decreased TUG time to </= 15 sec with LRAD to decrease risk for falls with transitional mobility    Status On-going    Target Date 09/23/20      PT LONG TERM GOAL #5   Title Patient will improve Berg score to >/= 45/56 to improve safety stability with ADLs in standing and reduce risk for falls    Status On-going    Target Date 09/23/20                 Plan - 08/28/20 1152    Clinical Impression Statement Ronne is progressing well with PT with improving gait stability with RW as well as improving safety with transfers using the RW. His balance is also improving as evidenced by increased Berg score to 39/56 from 32/56 and decrease in TUG time from 21.53 sec to 19.12 sec with the RW. All STGs now met. Lemonte has good potential to benefit from continued skilled PT for further strengthening and balance training to improved gait stability and allow resumption of prior activity level including walking in the neighborhood with hiking poles or a walking stick.    Personal Factors and Comorbidities Comorbidity 3+;Age;Past/Current Experience;Time since onset of injury/illness/exacerbation    Comorbidities chronic spinal cerebellar ataxia, neuropathy, CAD s/p CABG x 4, Aflutter, osteopenia, B shoulder surgery, carpal tunnel release, OSA    Examination-Activity Limitations Bed Mobility;Bend;Caring for Others;Locomotion Level;Transfers;Stand;Stairs;Squat;Toileting    Examination-Participation Restrictions Community Activity;Driving;Interpersonal Relationship;Meal Prep;Shop;Yard Work    Rehab Potential Good    PT Frequency 2x / week    PT Duration 8 weeks    PT Treatment/Interventions ADLs/Self Care Home Management;DME Instruction;Gait training;Stair training;Functional mobility training;Therapeutic activities;Therapeutic exercise;Balance training;Neuromuscular re-education;Patient/family education;Manual techniques;Passive  range of motion;Dry needling    PT Next Visit Plan hip flexibilty & LE strengthening - emphasis on balance and functional movement pattterns; core/postural strengthening; balance training    PT Home Exercise Plan MedBridge Access Codes: [id number] 03-Aug-2022);  [id number] (4/18);  [email or web address] (4/27)    Consulted and Agree with Plan of Care Patient           Patient will benefit from skilled therapeutic intervention in order to improve the following deficits and impairments:  Abnormal gait,Decreased activity tolerance,Decreased balance,Decreased coordination,Decreased endurance,Decreased knowledge of precautions,Decreased knowledge of use of DME,Decreased mobility,Decreased safety awareness,Decreased strength,Difficulty walking,Impaired perceived functional ability,Impaired flexibility,Improper body mechanics,Postural dysfunction  Visit Diagnosis: Unsteadiness on feet  Ataxic gait  Muscle weakness (generalized)  History of falling     Problem List Patient Active Problem List   Diagnosis Date Noted  . Neuropathy 12/12/2018  . Cerebellar ataxia ([place]) 12/12/2018  . Vitreous membranes and strands 10/20/2018  . Bilateral cold feet 04/07/2017  . Osteopenia of multiple sites 04/07/2017  . Vitamin D deficiency 04/07/2017  . CAD in native artery 03/09/2017  . Old MI (myocardial infarction) 03/09/2017  . Carpal tunnel syndrome, 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  . 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 PVC's (premature ventricular contractions) 10/09/2015  . Chronic dryness of both eyes 09/26/2015  . Hypermetropia 09/26/2015  . Presbyopia 09/26/2015  . Regular astigmatism 09/26/2015  . Hypermetropia of both eyes 09/26/2015  . Senile nuclear sclerosis 09/26/2015  . Stationary peripheral pterygium of right eye 09/26/2015  . Unspecified  esotropia 09/26/2015  . Ventral hernia without obstruction or gangrene 07/30/2015  . Dyslipidemia 05/09/2015  . OSA (obstructive sleep apnea) 05/09/2015  . S/P CABG  (coronary artery bypass graft) 05/09/2015  . Unspecified atrial flutter ([place]) 05/09/2015    Percival Spanish, PT, MPT 08/28/2020, 1:42 PM  [facility] [address]  [place], [place], [address] Phone: [phone]   Fax:  [phone]  Name: KRISTOFOR MICHALOWSKI MRN: [id number] Date of Birth: [birth year]

## 2020-09-02 ENCOUNTER — Other Ambulatory Visit: Payer: Self-pay

## 2020-09-02 ENCOUNTER — Encounter: Payer: Self-pay | Admitting: Physical Therapy

## 2020-09-02 ENCOUNTER — Ambulatory Visit: Payer: Medicare HMO | Admitting: Physical Therapy

## 2020-09-02 DIAGNOSIS — R2681 Unsteadiness on feet: Secondary | ICD-10-CM | POA: Diagnosis not present

## 2020-09-02 DIAGNOSIS — Z9181 History of falling: Secondary | ICD-10-CM

## 2020-09-02 DIAGNOSIS — R260 Ataxic gait: Secondary | ICD-10-CM

## 2020-09-02 DIAGNOSIS — M6281 Muscle weakness (generalized): Secondary | ICD-10-CM

## 2020-09-02 NOTE — Therapy (Signed)
[facility] [address]  [address] [place], [place], [address] Phone: [phone]   Fax:  [phone]  Physical Therapy Treatment  Patient Details  Name: Andre [name] MRN: [id number] Date of Birth: [birth year] Referring Provider (PT): Wilhelmina Mcardle, MD Loyal Jacobson, MD (PCP))   Encounter Date: 09/02/2020   PT End of Session - 09/02/20 1449    Visit Number 10    Number of Visits 16    Date for PT Re-Evaluation 09/23/20    Authorization Type [employer] Medicare    Progress Note Due on Visit --   end of POC - PN on visit #9   PT Start Time 1449    PT Stop Time 1528    PT Time Calculation (min) 39 min    Equipment Utilized During Treatment Gait belt    Activity Tolerance Patient tolerated treatment well    Behavior During Therapy [facility] for tasks assessed/performed           Past Medical History:  Diagnosis Date  . Carpal tunnel syndrome   . Cerebellar ataxia (HCC)    spinal cerebellar ataxia  . Dyslipidemia   . H/O rotator cuff surgery   . Obstructive sleep apnea     Past Surgical History:  Procedure Laterality Date  . BYPASS GRAFT AORTA TO AORTA     quadruple bypass  . CARPAL TUNNEL RELEASE Left   . EYE SURGERY Bilateral   . SHOULDER SURGERY     bilateral  . TONSILLECTOMY    . VASECTOMY      There were no vitals filed for this visit.   Subjective Assessment - 09/02/20 1451    Subjective Pt reports he has been increases his walks in the neighborhood with his 4-WW/rollator which includes some inclines - states he feels steady with this.    Pertinent History 06/22/20 - R sided pelvic fracture s/p fall    Diagnostic tests 06/25/20 R hip MRI: positive for acute, nondisplaced fractures of the anterior wall and roof of the right acetabulum. There is also likely a nondisplaced fracture through the posterior aspect of the right inferior pubic ramus.  Mild right pectineus strain without tear.   06/26/20 CT pelvis: 1. The  only fracture component well seen at CT is the anterior acetabular wall fracture, but based on MRI there is also an inferior   pubic ramus fracture and some fracture extension into the superior acetabulum on the right. The appearance favors nondisplaced right anterior column fracture.   2. Low-grade edema along the right obturator internus and hip adductor musculature, better seen at MRI.   3. Prostatomegaly.   4. Sigmoid colon diverticulosis.   5. 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   6. Aortic atherosclerosis.    Patient Stated Goals "to improve my balance and strength in my legs"    Currently in Pain? No/denies                             [facility] Adult PT Treatment/Exercise - 09/02/20 1449      Ambulation/Gait   Ambulation/Gait Assistance 4: Min guard    Ambulation/Gait Assistance Details cues for more anterior placement of hiking poles    Ambulation Distance (Feet) 180 Feet    Assistive device --   B hiking poles   Gait Pattern Step-through pattern;Ataxic    Ambulation Surface Level;Indoor  High Level Balance   High Level Balance Activities Side stepping;Braiding;Tandem walking;Backward walking      Exercises   Exercises Knee/Hip      Knee/Hip Exercises: Aerobic   Nustep L5 x 6 min      Knee/Hip Exercises: Standing   Other Standing Knee Exercises Alt toe clears to 8" step x 10 with 2 pole support, x 10 with single pole support; Alt toe clears from Airex pad to 8" step x 20 with 2 pole support               Balance Exercises - 09/02/20 1449      Balance Exercises: Standing   Tandem Gait Forward;Retro;Upper extremity support;2 reps   10 ft   Sidestepping Upper extremity support;4 reps   x 10 ft with back to wall in event of LOB; UE support on PT's forearms while PT guarding from gait belt   Other Standing Exercises Alt fwd step with cross-body reach to cone atop 36" FR x 20 -  CGA of PT               PT Short Term Goals - 08/28/20 1153      PT SHORT TERM GOAL #1   Title Patient will be independent with initial HEP    Status Achieved   08/12/20   Target Date 08/19/20      PT SHORT TERM GOAL #2   Title Patient will demonstrate safe transfer technique with RW to reduce fall risk    Status Achieved   08/28/20 - Pt better aware of safe hand placement during transfers and will self-correct if he tries to reach for the RW   Target Date 08/19/20      PT SHORT TERM GOAL #3   Title Patient will demonstrate safe gait pattern >500 ft with RW on all surfaces to reduce risk for falls    Status Achieved   08/28/20   Target Date 08/26/20      PT SHORT TERM GOAL #4   Title Patient will improve Berg score to >/= 38/56 to improve safety stability with ADLs in standing and reduce risk for falls    Status Achieved   08/28/20   Target Date 08/26/20      PT SHORT TERM GOAL #5   Title Patient will safely negotiate stairs with handraill support to allow safe access to bedroom and home egress    Status Achieved   08/28/20   Target Date 08/26/20             PT Long Term Goals - 08/12/20 1555      PT LONG TERM GOAL #1   Title Patient will be independent with ongoing/advanced HEP +/- gym program for self-management at home    Status On-going    Target Date 09/23/20      PT LONG TERM GOAL #2   Title Patient will demonstrate improved B LE strength to >/= 4+/5 for improved stability and ease of mobility    Status On-going    Target Date 09/23/20      PT LONG TERM GOAL #3   Title Patient to demonstrate symmetrical step length, good foot clearance, and good heel-toe pattern with upright posture when ambulating with SPC or walking poles    Status On-going    Target Date 09/23/20      PT LONG TERM GOAL #4   Title Patient will demonstrate decreased TUG time to </= 15 sec with LRAD to decrease risk for falls  with transitional mobility    Status On-going    Target Date  09/23/20      PT LONG TERM GOAL #5   Title Patient will improve Berg score to >/= 45/56 to improve safety stability with ADLs in standing and reduce risk for falls    Status On-going    Target Date 09/23/20                 Plan - 09/02/20 1456    Clinical Impression Statement Andre Maduro notes shoulder retraction exercises have helped with his shoulder and he is no longer having any shoulder pain. He has been increasing his distance with his walks in the neighborhood with his rollator which adds more inclines - he reports no issues with this but still wants to work toward switching to be able to walk with his hiking/trekking poles. Therapeutic activities today targeting decreasing reliance on UE support/AD for balance as well as reciprocal movement patterns to simulation trunk rotation and reciprocal arm swing with carryover into gait training with hiking poles. CGA still necessary for all balance activities but no significant LOB observed today.    Personal Factors and Comorbidities Comorbidity 3+;Age;Past/Current Experience;Time since onset of injury/illness/exacerbation    Comorbidities chronic spinal cerebellar ataxia, neuropathy, CAD s/p CABG x 4, Aflutter, osteopenia, B shoulder surgery, carpal tunnel release, OSA    Examination-Activity Limitations Bed Mobility;Bend;Caring for Others;Locomotion Level;Transfers;Stand;Stairs;Squat;Toileting    Examination-Participation Restrictions Community Activity;Driving;Interpersonal Relationship;Meal Prep;Shop;Yard Work    Rehab Potential Good    PT Frequency 2x / week    PT Duration 8 weeks    PT Treatment/Interventions ADLs/Self Care Home Management;DME Instruction;Gait training;Stair training;Functional mobility training;Therapeutic activities;Therapeutic exercise;Balance training;Neuromuscular re-education;Patient/family education;Manual techniques;Passive range of motion;Dry needling    PT Next Visit Plan hip flexibilty & LE strengthening -  emphasis on balance and functional movement pattterns; core/postural strengthening; balance training    PT Home Exercise Plan MedBridge Access Codes: [id number] 2022-08-11);  [id number] (4/18);  [id number] (4/27)    Consulted and Agree with Plan of Care Patient           Patient will benefit from skilled therapeutic intervention in order to improve the following deficits and impairments:  Abnormal gait,Decreased activity tolerance,Decreased balance,Decreased coordination,Decreased endurance,Decreased knowledge of precautions,Decreased knowledge of use of DME,Decreased mobility,Decreased safety awareness,Decreased strength,Difficulty walking,Impaired perceived functional ability,Impaired flexibility,Improper body mechanics,Postural dysfunction  Visit Diagnosis: Unsteadiness on feet  Ataxic gait  Muscle weakness (generalized)  History of falling     Problem List Patient Active Problem List   Diagnosis Date Noted  . Neuropathy 12/12/2018  . Cerebellar ataxia (HCC) 12/12/2018  . Vitreous membranes and strands 10/20/2018  . Bilateral cold feet 04/07/2017  . Osteopenia of multiple sites 04/07/2017  . Vitamin D deficiency 04/07/2017  . CAD in native artery 03/09/2017  . Old MI (myocardial infarction) 03/09/2017  . Carpal tunnel syndrome, 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  . 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 PVC's (premature ventricular contractions) 10/09/2015  . Chronic dryness of both eyes 09/26/2015  . Hypermetropia 09/26/2015  . Presbyopia 09/26/2015  . Regular astigmatism 09/26/2015  . Hypermetropia of both eyes 09/26/2015  . Senile nuclear sclerosis 09/26/2015  . Stationary peripheral pterygium of right eye 09/26/2015  . Unspecified esotropia 09/26/2015  . Ventral hernia without obstruction or gangrene 07/30/2015  . Dyslipidemia 05/09/2015  . OSA (obstructive sleep apnea) 05/09/2015  . 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 Unspecified atrial flutter (HCC) 05/09/2015     Marry Guan, PT, MPT  09/02/2020, 6:12 PM  [facility] [address]  [address] [place], [place], [address] Phone: [phone]   Fax:  [phone]  Name: Andre [name] MRN: [id number] Date of Birth: 12/04/1940

## 2020-09-04 ENCOUNTER — Ambulatory Visit: Payer: Medicare HMO | Admitting: Physical Therapy

## 2020-09-04 ENCOUNTER — Other Ambulatory Visit: Payer: Self-pay

## 2020-09-04 ENCOUNTER — Encounter: Payer: Self-pay | Admitting: Physical Therapy

## 2020-09-04 DIAGNOSIS — Z9181 History of falling: Secondary | ICD-10-CM

## 2020-09-04 DIAGNOSIS — M6281 Muscle weakness (generalized): Secondary | ICD-10-CM

## 2020-09-04 DIAGNOSIS — R2681 Unsteadiness on feet: Secondary | ICD-10-CM

## 2020-09-04 DIAGNOSIS — R260 Ataxic gait: Secondary | ICD-10-CM

## 2020-09-04 NOTE — Therapy (Signed)
[facility] [address]  [place], [place], [address] Phone: [phone]   Fax:  [phone]  Physical Therapy Treatment  Patient Details  Name: Andre [name] MRN: [id number] Date of Birth: [birth year] Referring Provider (PT): Merilynn Finland, MD Jefm Petty, MD (PCP))   Encounter Date: 09/04/2020   PT End of Session - 09/04/20 1145    Visit Number 11    Number of Visits 16    Date for PT Re-Evaluation 09/23/20    Authorization Type [employer]    Progress Note Due on Visit --   end of POC - PN on visit #9   PT Start Time 1145    PT Stop Time 1229    PT Time Calculation (min) 44 min    Equipment Utilized During Treatment Gait belt    Activity Tolerance Patient tolerated treatment well    Behavior During Therapy [facility] for tasks assessed/performed           Past Medical History:  Diagnosis Date  . Carpal tunnel syndrome   . Cerebellar ataxia (HCC)    spinal cerebellar ataxia  . Dyslipidemia   . H/O rotator cuff surgery   . Obstructive sleep apnea     Past Surgical History:  Procedure Laterality Date  . BYPASS GRAFT AORTA TO AORTA     quadruple bypass  . CARPAL TUNNEL RELEASE Left   . EYE SURGERY Bilateral   . SHOULDER SURGERY     bilateral  . TONSILLECTOMY    . VASECTOMY      There were no vitals filed for this visit.   Subjective Assessment - 09/04/20 1152    Subjective No new concersn today.    Pertinent History 06/22/20 - R sided pelvic fracture s/p fall    Diagnostic tests 06/25/20 R hip MRI: positive for acute, nondisplaced fractures of the anterior wall and roof of the right acetabulum. There is also likely a nondisplaced fracture through the posterior aspect of the right inferior pubic ramus.  Mild right pectineus strain without tear.   06/26/20 CT pelvis: 1. The only fracture component well seen at CT is the anterior acetabular wall fracture, but based on MRI there is also an inferior    pubic ramus fracture and some fracture extension into the superior acetabulum on the right. The appearance favors nondisplaced right anterior column fracture.   2. Low-grade edema along the right obturator internus and hip adductor musculature, better seen at MRI.   3. Prostatomegaly.   4. Sigmoid colon diverticulosis.   5. 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   6. Aortic atherosclerosis.    Patient Stated Goals "to improve my balance and strength in my legs"    Currently in Pain? No/denies                             [facility] Adult PT Treatment/Exercise - 09/04/20 1145      Ambulation/Gait   Ambulation/Gait Assistance 4: Min guard    Ambulation/Gait Assistance Details continued cues for more anterior placement of hiking poles    Ambulation Distance (Feet) 270 Feet    Assistive device --   B hiking poles   Gait Pattern Step-through pattern;Ataxic    Ambulation Surface Level;Indoor    Gait Comments Improving consistency of step length and foot clearance but still with tendency to keep poles too posterior at  sides causing the poles to slide behind him at times while walking.      High Level Balance   High Level Balance Activities Negotiating over obstacles    High Level Balance Comments Recpirocal stepping with hiking poles over 6 sequential lines (canes, yardsticks & carpet edging) x 10 with CGA of PT      Exercises   Exercises Knee/Hip      Knee/Hip Exercises: Aerobic   Nustep L5 x 6 min (UE/LE)      Shoulder Exercises: Seated   Extension Strengthening;Both;Theraband;20 reps    Theraband Level (Shoulder Extension) Level 3 (Green)    Extension Limitations + scap retraction; seated on dynadisc with feet on Airex pad for increased balance/core activation    Row Strengthening;Both;Theraband;20 reps    Theraband Level (Shoulder Row) Level 3 (Green)    Row Limitations + scap retraction; seated on  dynadisc with feet on Airex pad for increased balance/core activation    Other Seated Exercises R/L green TB pallof anti-rotation press x 10; seated on dynadisc with feet on Airex pad for increased balance/core activation               Balance Exercises - 09/04/20 1145      Balance Exercises: Standing   Other Standing Exercises Alt fwd step to Airex pad with cross-body reach to cone atop 36" FR x 20 - CGA of PT    Other Standing Exercises Comments Bean bag reach beyond BOS + toss 2 x 12; 2nd set while standing on Airex pad               PT Short Term Goals - 08/28/20 1153      PT SHORT TERM GOAL #1   Title Patient will be independent with initial HEP    Status Achieved   08/12/20   Target Date 08/19/20      PT SHORT TERM GOAL #2   Title Patient will demonstrate safe transfer technique with RW to reduce fall risk    Status Achieved   08/28/20 - Pt better aware of safe hand placement during transfers and will self-correct if he tries to reach for the RW   Target Date 08/19/20      PT SHORT TERM GOAL #3   Title Patient will demonstrate safe gait pattern >500 ft with RW on all surfaces to reduce risk for falls    Status Achieved   08/28/20   Target Date 08/26/20      PT SHORT TERM GOAL #4   Title Patient will improve Berg score to >/= 38/56 to improve safety stability with ADLs in standing and reduce risk for falls    Status Achieved   08/28/20   Target Date 08/26/20      PT SHORT TERM GOAL #5   Title Patient will safely negotiate stairs with handraill support to allow safe access to bedroom and home egress    Status Achieved   08/28/20   Target Date 08/26/20             PT Long Term Goals - 09/04/20 1229      PT LONG TERM GOAL #1   Title Patient will be independent with ongoing/advanced HEP +/- gym program for self-management at home    Status Partially Met    Target Date 09/23/20      PT LONG TERM GOAL #2   Title Patient will demonstrate improved B LE strength to  >/= 4+/5 for improved stability and ease of mobility  Status On-going    Target Date 09/23/20      PT LONG TERM GOAL #3   Title Patient to demonstrate symmetrical step length, good foot clearance, and good heel-toe pattern with upright posture when ambulating with SPC or walking poles    Status On-going    Target Date 09/23/20      PT LONG TERM GOAL #4   Title Patient will demonstrate decreased TUG time to </= 15 sec with LRAD to decrease risk for falls with transitional mobility    Status On-going    Target Date 09/23/20      PT LONG TERM GOAL #5   Title Patient will improve Berg score to >/= 45/56 to improve safety stability with ADLs in standing and reduce risk for falls    Status On-going    Target Date 09/23/20                 Plan - 09/04/20 1229    Clinical Impression Statement Andre [name] reports his HEP is going well and states he has been progressing the exercises by increasing the number of reps but feels like he would be ready for progression of the theraband resistance for the shoulder exercises - LTG #1 partially met. Progressed scap retraction + rows/extension to green TB with good tolerance and added unstable seating to increase core activation and balance challenge with pt cautioned to remain with stable seating for home performance. Progressed static and dynamic standing balance with incorporation of unstable surfaces - pt mostly able to self-correct any LOB with only occasional min A from PT. Promoted increased step length and foot clearance along with better hiking pole placement with activities requiring sequential steps over obstacles - good carryover of step pattern into ambulation with hiking poles but pt still demonstrating lack of adequate forward positioning of poles while walking, leading to occasional slippage posteriorly of poles. Andre [name] will continue to benefit from skilled pt for functional strengthening, balance training and gait training to restore PLOF and  improve safety/decrease fall risk with ambulation, esp outdoors.    Personal Factors and Comorbidities Comorbidity 3+;Age;Past/Current Experience;Time since onset of injury/illness/exacerbation    Comorbidities chronic spinal cerebellar ataxia, neuropathy, CAD s/p CABG x 4, Aflutter, osteopenia, B shoulder surgery, carpal tunnel release, OSA    Examination-Activity Limitations Bed Mobility;Bend;Caring for Others;Locomotion Level;Transfers;Stand;Stairs;Squat;Toileting    Examination-Participation Restrictions Community Activity;Driving;Interpersonal Relationship;Meal Prep;Shop;Yard Work    Rehab Potential Good    PT Frequency 2x / week    PT Duration 8 weeks    PT Treatment/Interventions ADLs/Self Care Home Management;DME Instruction;Gait training;Stair training;Functional mobility training;Therapeutic activities;Therapeutic exercise;Balance training;Neuromuscular re-education;Patient/family education;Manual techniques;Passive range of motion;Dry needling    PT Next Visit Plan hip flexibilty & LE strengthening - emphasis on balance and functional movement pattterns; core/postural strengthening; balance training; gait training with trekking/hiking poles    PT Home Exercise Plan MedBridge Access Codes: [email or web address] 08/27/2022);  [id number] (4/18);  [email or web address] (4/27)    Consulted and Agree with Plan of Care Patient           Patient will benefit from skilled therapeutic intervention in order to improve the following deficits and impairments:  Abnormal gait,Decreased activity tolerance,Decreased balance,Decreased coordination,Decreased endurance,Decreased knowledge of precautions,Decreased knowledge of use of DME,Decreased mobility,Decreased safety awareness,Decreased strength,Difficulty walking,Impaired perceived functional ability,Impaired flexibility,Improper body mechanics,Postural dysfunction  Visit Diagnosis: Unsteadiness on feet  Ataxic gait  Muscle weakness (generalized)  History of  falling     Problem List Patient Active Problem List   Diagnosis Date Noted  .  Neuropathy 12/12/2018  . Cerebellar ataxia ([place]) 12/12/2018  . Vitreous membranes and strands 10/20/2018  . Bilateral cold feet 04/07/2017  . Osteopenia of multiple sites 04/07/2017  . Vitamin D deficiency 04/07/2017  . CAD in native artery 03/09/2017  . Old MI (myocardial infarction) 03/09/2017  . Carpal tunnel syndrome, 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  . 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 PVC's (premature ventricular contractions) 10/09/2015  . Chronic dryness of both eyes 09/26/2015  . Hypermetropia 09/26/2015  . Presbyopia 09/26/2015  . Regular astigmatism 09/26/2015  . Hypermetropia of both eyes 09/26/2015  . Senile nuclear sclerosis 09/26/2015  . Stationary peripheral pterygium of right eye 09/26/2015  . Unspecified esotropia 09/26/2015  . Ventral hernia without obstruction or gangrene 07/30/2015  . Dyslipidemia 05/09/2015  . OSA (obstructive sleep apnea) 05/09/2015  . 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 Unspecified atrial flutter ([place]) 05/09/2015    Percival Spanish, PT, MPT 09/04/2020, 1:09 PM  [facility] [address]  [place], [place], [address] Phone: [phone]   Fax:  [phone]  Name: Andre [name] MRN: [id number] Date of Birth: Nov 01, 1940

## 2020-09-09 ENCOUNTER — Ambulatory Visit: Payer: Medicare HMO

## 2020-09-09 ENCOUNTER — Other Ambulatory Visit: Payer: Self-pay

## 2020-09-09 DIAGNOSIS — R260 Ataxic gait: Secondary | ICD-10-CM

## 2020-09-09 DIAGNOSIS — R2681 Unsteadiness on feet: Secondary | ICD-10-CM

## 2020-09-09 DIAGNOSIS — M6281 Muscle weakness (generalized): Secondary | ICD-10-CM

## 2020-09-09 DIAGNOSIS — Z9181 History of falling: Secondary | ICD-10-CM

## 2020-09-09 NOTE — Therapy (Signed)
[facility] [address]  [place], [place], [address] Phone: [phone]   Fax:  [phone]  Physical Therapy Treatment  Patient Details  Name: Andre [name] MRN: [id number] Date of Birth: 11/20/1940 Referring Provider (PT): Merilynn Finland, MD Jefm Petty, MD (PCP))   Encounter Date: 09/09/2020   PT End of Session - 09/09/20 1546    Visit Number 12    Number of Visits 16    Date for PT Re-Evaluation 09/23/20    Authorization Type [employer] Medicare    PT Start Time 4239    PT Stop Time 1531    PT Time Calculation (min) 45 min    Equipment Utilized During Treatment Gait belt    Activity Tolerance Patient tolerated treatment well    Behavior During Therapy [facility] for tasks assessed/performed           Past Medical History:  Diagnosis Date  . Carpal tunnel syndrome   . Cerebellar ataxia (HCC)    spinal cerebellar ataxia  . Dyslipidemia   . H/O rotator cuff surgery   . Obstructive sleep apnea     Past Surgical History:  Procedure Laterality Date  . BYPASS GRAFT AORTA TO AORTA     quadruple bypass  . CARPAL TUNNEL RELEASE Left   . EYE SURGERY Bilateral   . SHOULDER SURGERY     bilateral  . TONSILLECTOMY    . VASECTOMY      There were no vitals filed for this visit.   Subjective Assessment - 09/09/20 1452    Subjective Pt has been doing good, still feels need to practice balance    Pertinent History 06/22/20 - R sided pelvic fracture s/p fall    Diagnostic tests 06/25/20 R hip MRI: positive for acute, nondisplaced fractures of the anterior wall and roof of the right acetabulum. There is also likely a nondisplaced fracture through the posterior aspect of the right inferior pubic ramus.  Mild right pectineus strain without tear.   06/26/20 CT pelvis: 1. The only fracture component well seen at CT is the anterior acetabular wall fracture, but based on MRI there is also an inferior   pubic ramus fracture and  some fracture extension into the superior acetabulum on the right. The appearance favors nondisplaced right anterior column fracture.   2. Low-grade edema along the right obturator internus and hip adductor musculature, better seen at MRI.   3. Prostatomegaly.   4. Sigmoid colon diverticulosis.   5. 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   6. Aortic atherosclerosis.    Patient Stated Goals "to improve my balance and strength in my legs"    Currently in Pain? No/denies                             [facility] Adult PT Treatment/Exercise - 09/09/20 0001      High Level Balance   High Level Balance Activities Negotiating over obstacles   toe taps to cones from airex pad 10 reps each LE with counter support, fwd reaching from airex pad   High Level Balance Comments walking over canes, rolled up yoga mats, and foam rolls 3 trials stepping over      Exercises   Exercises Knee/Hip      Knee/Hip Exercises: Aerobic   Recumbent Bike L4x76mn      Knee/Hip Exercises: Seated   Sit to  Sand 10 reps;with UE support   practiced STS pushing from mat table to avoid pushing with walker     Shoulder Exercises: Seated   Row Strengthening;Both;Theraband;20 reps    Theraband Level (Shoulder Row) Level 3 (Green)    Row Limitations + scap retraction; seated on dynadisc with feet on Airex pad for increased balance/core activation    Flexion Strengthening;Both;20 reps;Weights    Flexion Weight (lbs) 2    Flexion Limitations feet on balance disk to increase core activation    Other Seated Exercises R/L green TB pallof anti-rotation press, anti rotation with circles x 15                    PT Short Term Goals - 08/28/20 1153      PT SHORT TERM GOAL #1   Title Patient will be independent with initial HEP    Status Achieved   08/12/20   Target Date 08/19/20      PT SHORT TERM GOAL #2   Title Patient will  demonstrate safe transfer technique with RW to reduce fall risk    Status Achieved   08/28/20 - Pt better aware of safe hand placement during transfers and will self-correct if he tries to reach for the RW   Target Date 08/19/20      PT SHORT TERM GOAL #3   Title Patient will demonstrate safe gait pattern >500 ft with RW on all surfaces to reduce risk for falls    Status Achieved   08/28/20   Target Date 08/26/20      PT SHORT TERM GOAL #4   Title Patient will improve Berg score to >/= 38/56 to improve safety stability with ADLs in standing and reduce risk for falls    Status Achieved   08/28/20   Target Date 08/26/20      PT SHORT TERM GOAL #5   Title Patient will safely negotiate stairs with handraill support to allow safe access to bedroom and home egress    Status Achieved   08/28/20   Target Date 08/26/20             PT Long Term Goals - 09/04/20 1229      PT LONG TERM GOAL #1   Title Patient will be independent with ongoing/advanced HEP +/- gym program for self-management at home    Status Partially Met    Target Date 09/23/20      PT LONG TERM GOAL #2   Title Patient will demonstrate improved B LE strength to >/= 4+/5 for improved stability and ease of mobility    Status On-going    Target Date 09/23/20      PT LONG TERM GOAL #3   Title Patient to demonstrate symmetrical step length, good foot clearance, and good heel-toe pattern with upright posture when ambulating with SPC or walking poles    Status On-going    Target Date 09/23/20      PT LONG TERM GOAL #4   Title Patient will demonstrate decreased TUG time to </= 15 sec with LRAD to decrease risk for falls with transitional mobility    Status On-going    Target Date 09/23/20      PT LONG TERM GOAL #5   Title Patient will improve Berg score to >/= 45/56 to improve safety stability with ADLs in standing and reduce risk for falls    Status On-going    Target Date 09/23/20  Plan - 09/09/20  1547    Clinical Impression Statement Pt responded well to treatment. Practiced STS transfers with him emphasizing UE support from chair/mat table instead of pushing up with walker. He did well with the balance activities today, did have some occassional LOB and needed min A to recover but at time able to self correct. Pt reports that balance continues to be a challenge for him but he does feel like it is getting better. Continued with postural exercises with core stab to increase core strength to further support the LEs to reduce risk for falls, only cueing required to perform slow and controlled movements.    Personal Factors and Comorbidities Comorbidity 3+;Age;Past/Current Experience;Time since onset of injury/illness/exacerbation    Comorbidities chronic spinal cerebellar ataxia, neuropathy, CAD s/p CABG x 4, Aflutter, osteopenia, B shoulder surgery, carpal tunnel release, OSA    PT Frequency 2x / week    PT Duration 8 weeks    PT Treatment/Interventions ADLs/Self Care Home Management;DME Instruction;Gait training;Stair training;Functional mobility training;Therapeutic activities;Therapeutic exercise;Balance training;Neuromuscular re-education;Patient/family education;Manual techniques;Passive range of motion;Dry needling    PT Next Visit Plan hip flexibilty & LE strengthening - emphasis on balance and functional movement pattterns; core/postural strengthening; balance training; gait training with trekking/hiking poles    PT Home Exercise Plan MedBridge Access Codes: [email or web address] 08-27-22);  [id number] (4/18);  [email or web address] (4/27)    Consulted and Agree with Plan of Care Patient    Family Member Consulted wife - Ginny           Patient will benefit from skilled therapeutic intervention in order to improve the following deficits and impairments:  Abnormal gait,Decreased activity tolerance,Decreased balance,Decreased coordination,Decreased endurance,Decreased knowledge of precautions,Decreased knowledge of use  of DME,Decreased mobility,Decreased safety awareness,Decreased strength,Difficulty walking,Impaired perceived functional ability,Impaired flexibility,Improper body mechanics,Postural dysfunction  Visit Diagnosis: Unsteadiness on feet  Ataxic gait  Muscle weakness (generalized)  History of falling     Problem List Patient Active Problem List   Diagnosis Date Noted  . Neuropathy 12/12/2018  . Cerebellar ataxia ([place]) 12/12/2018  . Vitreous membranes and strands 10/20/2018  . Bilateral cold feet 04/07/2017  . Osteopenia of multiple sites 04/07/2017  . Vitamin D deficiency 04/07/2017  . CAD in native artery 03/09/2017  . Old MI (myocardial infarction) 03/09/2017  . Carpal tunnel syndrome, 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  . 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 PVC's (premature ventricular contractions) 10/09/2015  . Chronic dryness of both eyes 09/26/2015  . Hypermetropia 09/26/2015  . Presbyopia 09/26/2015  . Regular astigmatism 09/26/2015  . Hypermetropia of both eyes 09/26/2015  . Senile nuclear sclerosis 09/26/2015  . Stationary peripheral pterygium of right eye 09/26/2015  . Unspecified esotropia 09/26/2015  . Ventral hernia without obstruction or gangrene 07/30/2015  . Dyslipidemia 05/09/2015  . OSA (obstructive sleep apnea) 05/09/2015  . 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 Unspecified atrial flutter ([place]) 05/09/2015    Artist Pais, PTA 09/09/2020, 3:57 PM  [facility] [address]  [place], [place], [address] Phone: [phone]   Fax:  [phone]  Name: VALERIANO BAIN MRN: [id number] Date of Birth: 05/08/1940

## 2020-09-11 ENCOUNTER — Other Ambulatory Visit: Payer: Self-pay

## 2020-09-11 ENCOUNTER — Encounter: Payer: Self-pay | Admitting: Physical Therapy

## 2020-09-11 ENCOUNTER — Ambulatory Visit: Payer: Medicare HMO | Admitting: Physical Therapy

## 2020-09-11 DIAGNOSIS — R2681 Unsteadiness on feet: Secondary | ICD-10-CM | POA: Diagnosis not present

## 2020-09-11 DIAGNOSIS — R260 Ataxic gait: Secondary | ICD-10-CM

## 2020-09-11 DIAGNOSIS — M6281 Muscle weakness (generalized): Secondary | ICD-10-CM

## 2020-09-11 DIAGNOSIS — Z9181 History of falling: Secondary | ICD-10-CM

## 2020-09-11 NOTE — Therapy (Signed)
[facility] [place] [address]  [place], [place], [address] Phone: [phone]   Fax:  [phone]  Physical Therapy Treatment  Patient Details  Name: Andre [name] MRN: [id number] Date of Birth: 1941-03-21 Referring Provider (PT): Andre Finland, MD Andre Petty, MD (PCP))   Encounter Date: 09/11/2020   PT End of Session - 09/11/20 1150    Visit Number 13    Number of Visits 16    Date for PT Re-Evaluation 09/23/20    Authorization Type [employer]    Progress Note Due on Visit --   end of POC - PN on visit #9   PT Start Time 1150    PT Stop Time 1232    PT Time Calculation (min) 42 min    Activity Tolerance Patient tolerated treatment well    Behavior During Therapy [facility] for tasks assessed/performed           Past Medical History:  Diagnosis Date  . Carpal tunnel syndrome   . Cerebellar ataxia (HCC)    spinal cerebellar ataxia  . Dyslipidemia   . H/O rotator cuff surgery   . Obstructive sleep apnea     Past Surgical History:  Procedure Laterality Date  . BYPASS GRAFT AORTA TO AORTA     quadruple bypass  . CARPAL TUNNEL RELEASE Left   . EYE SURGERY Bilateral   . SHOULDER SURGERY     bilateral  . TONSILLECTOMY    . VASECTOMY      There were no vitals filed for this visit.   Subjective Assessment - 09/11/20 1155    Subjective "Same old, same old - just trying to improve my strength and balance".    Pertinent History 06/22/20 - R sided pelvic fracture s/p fall    Diagnostic tests 06/25/20 R hip MRI: positive for acute, nondisplaced fractures of the anterior wall and roof of the right acetabulum. There is also likely a nondisplaced fracture through the posterior aspect of the right inferior pubic ramus.  Mild right pectineus strain without tear.   06/26/20 CT pelvis: 1. The only fracture component well seen at CT is the anterior acetabular wall fracture, but based on MRI there is also an inferior    pubic ramus fracture and some fracture extension into the superior acetabulum on the right. The appearance favors nondisplaced right anterior column fracture.   2. Low-grade edema along the right obturator internus and hip adductor musculature, better seen at MRI.   3. Prostatomegaly.   4. Sigmoid colon diverticulosis.   5. 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   6. Aortic atherosclerosis.    Patient Stated Goals "to improve my balance and strength in my legs"    Currently in Pain? No/denies              [facility] PT Assessment - 09/11/20 1150      Strength   Right Hip Flexion 4/5    Right Hip Extension 4+/5    Right Hip External Rotation  5/5    Right Hip Internal Rotation 5/5    Right Hip ABduction 4/5    Right Hip ADduction 4+/5    Left Hip Flexion 4/5    Left Hip Extension 4/5    Left Hip External Rotation 5/5    Left Hip Internal Rotation 5/5    Left Hip ABduction 4+/5    Left Hip ADduction 4+/5    Right  Knee Flexion 5/5    Right Knee Extension 5/5    Left Knee Flexion 5/5    Left Knee Extension 5/5    Right Ankle Dorsiflexion 4+/5    Right Ankle Plantar Flexion 4/5    Left Ankle Dorsiflexion 4/5    Left Ankle Plantar Flexion 3/5                         [facility] Adult PT Treatment/Exercise - 09/11/20 1150      Exercises   Exercises Knee/Hip      Knee/Hip Exercises: Aerobic   Nustep L5 x 6 min (UE/LE)      Knee/Hip Exercises: Standing   Heel Raises Both;2 sets;10 reps;3 seconds;Left    Heel Raises Limitations B con/L ecc with 2 HHA at counter; Toe raises x 20 at the counter w/ 2 HHA    Hip Flexion Right;Left;10 reps;Stengthening;Knee straight    Hip Flexion Limitations SLS + opp LE SLR with red TB at ankle; UE support on back of chair for balance + CGA/SBA of PT    Hip ADduction Right;Left;10 reps;Strengthening    Hip ADduction Limitations SLS + opp LE SLR with red TB at ankle; UE  support on back of chair for balance + CGA/SBA of PT    Hip Abduction Right;Left;10 reps;Stengthening;Knee straight    Abduction Limitations SLS + opp LE SLR with red TB at ankle; UE support on back of chair for balance + CGA/SBA of PT    Hip Extension Right;Left;10 reps;Stengthening;Knee straight    Extension Limitations SLS + opp LE SLR with red TB at ankle; UE support on back of chair for balance + CGA/SBA of PT                    PT Short Term Goals - 08/28/20 1153      PT SHORT TERM GOAL #1   Title Patient [name] be independent with initial HEP    Status Achieved   08/12/20   Target Date 08/19/20      PT SHORT TERM GOAL #2   Title Patient [name] demonstrate safe transfer technique with RW to reduce fall risk    Status Achieved   08/28/20 - Pt better aware of safe hand placement during transfers and [name] self-correct if he tries to reach for the RW   Target Date 08/19/20      PT SHORT TERM GOAL #3   Title Patient [name] demonstrate safe gait pattern >500 ft with RW on all surfaces to reduce risk for falls    Status Achieved   08/28/20   Target Date 08/26/20      PT SHORT TERM GOAL #4   Title Patient [name] improve Berg score to >/= 38/56 to improve safety stability with ADLs in standing and reduce risk for falls    Status Achieved   08/28/20   Target Date 08/26/20      PT SHORT TERM GOAL #5   Title Patient [name] safely negotiate stairs with handraill support to allow safe access to bedroom and home egress    Status Achieved   08/28/20   Target Date 08/26/20             PT Long Term Goals - 09/11/20 1230      PT LONG TERM GOAL #1   Title Patient [name] be independent with ongoing/advanced HEP +/- gym program for self-management at home    Status Partially Met  Target Date 09/23/20      PT LONG TERM GOAL #2   Title Patient [name] demonstrate improved B LE strength to >/= 4+/5 for improved stability and ease of mobility    Status Partially Met   09/11/20 - met except L ankle  and isolated motions in B hips   Target Date 09/23/20      PT LONG TERM GOAL #3   Title Patient to demonstrate symmetrical step length, good foot clearance, and good heel-toe pattern with upright posture when ambulating with SPC or walking poles    Status On-going    Target Date 09/23/20      PT LONG TERM GOAL #4   Title Patient [name] demonstrate decreased TUG time to </= 15 sec with LRAD to decrease risk for falls with transitional mobility    Status On-going    Target Date 09/23/20      PT LONG TERM GOAL #5   Title Patient [name] improve Berg score to >/= 45/56 to improve safety stability with ADLs in standing and reduce risk for falls    Status On-going    Target Date 09/23/20                 Plan - 09/11/20 1232    Clinical Impression Statement Andre [name] continues to progress with PT with improving LE strength with majority of LE MMT strength >/= 4+/5 with remaining weakness predominantly in L ankle and proximally at hips - LTG #2 partially met. Progressed strengthening and balance targeting areas of ongoing weakness while incorporating balance component with SLS + 4-way hip kicker with red TB - pt requiring CGA/SBA for safety along with single UE support on back of chair for balance. Encouraged pt to work on SLS + 3-way kicks w/o band at home (may add ankle cuff weights) using light UE support on counter or another stable surface. Andre [name] continue to benefit from skilled PT to further improve core as well as proximal/distal LE strength as well as balance for improved stability with ambulation and ADLs.    Comorbidities chronic spinal cerebellar ataxia, neuropathy, CAD s/p CABG x 4, Aflutter, osteopenia, B shoulder surgery, carpal tunnel release, OSA    Rehab Potential Good    PT Frequency 2x / week    PT Duration 8 weeks    PT Treatment/Interventions ADLs/Self Care Home Management;DME Instruction;Gait training;Stair training;Functional mobility training;Therapeutic  activities;Therapeutic exercise;Balance training;Neuromuscular re-education;Patient/family education;Manual techniques;Passive range of motion;Dry needling    PT Next Visit Plan hip flexibilty & LE strengthening - emphasis on balance and functional movement pattterns; core/postural strengthening; balance training; gait training with trekking/hiking poles    PT Home Exercise Plan MedBridge Access Codes: [id number] 08-21-22);  [id number] (4/18);  [email or web address] (4/27)    Consulted and Agree with Plan of Care Patient           Patient [name] benefit from skilled therapeutic intervention in order to improve the following deficits and impairments:  Abnormal gait,Decreased activity tolerance,Decreased balance,Decreased coordination,Decreased endurance,Decreased knowledge of precautions,Decreased knowledge of use of DME,Decreased mobility,Decreased safety awareness,Decreased strength,Difficulty walking,Impaired perceived functional ability,Impaired flexibility,Improper body mechanics,Postural dysfunction  Visit Diagnosis: Unsteadiness on feet  Ataxic gait  Muscle weakness (generalized)  History of falling     Problem List Patient Active Problem List   Diagnosis Date Noted  . Neuropathy 12/12/2018  . Cerebellar ataxia ([place]) 12/12/2018  . Vitreous membranes and strands 10/20/2018  . Bilateral cold feet 04/07/2017  . Osteopenia of multiple sites 04/07/2017  . Vitamin D deficiency 04/07/2017  .  CAD in native artery 03/09/2017  . Old MI (myocardial infarction) 03/09/2017  . Carpal tunnel syndrome, 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  . 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 PVC's (premature ventricular contractions) 10/09/2015  . Chronic dryness of both eyes 09/26/2015  . Hypermetropia 09/26/2015  . Presbyopia 09/26/2015  . Regular astigmatism 09/26/2015  . Hypermetropia of both eyes 09/26/2015  . Senile nuclear sclerosis 09/26/2015  . Stationary peripheral pterygium of right eye 09/26/2015  . Unspecified  esotropia 09/26/2015  . Ventral hernia without obstruction or gangrene 07/30/2015  . Dyslipidemia 05/09/2015  . OSA (obstructive sleep apnea) 05/09/2015  . 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 Unspecified atrial flutter ([place]) 05/09/2015    Percival Spanish, PT, MPT 09/11/2020, 12:59 PM  [facility] [address]  [place], [place], [address] Phone: [phone]   Fax:  [phone]  Name: BRANDIN STETZER MRN: [id number] Date of Birth: 04/27/1941

## 2020-09-16 ENCOUNTER — Other Ambulatory Visit: Payer: Self-pay

## 2020-09-16 ENCOUNTER — Ambulatory Visit: Payer: Medicare HMO | Admitting: Physical Therapy

## 2020-09-16 ENCOUNTER — Encounter: Payer: Self-pay | Admitting: Physical Therapy

## 2020-09-16 DIAGNOSIS — R2681 Unsteadiness on feet: Secondary | ICD-10-CM

## 2020-09-16 DIAGNOSIS — R260 Ataxic gait: Secondary | ICD-10-CM

## 2020-09-16 DIAGNOSIS — M6281 Muscle weakness (generalized): Secondary | ICD-10-CM

## 2020-09-16 DIAGNOSIS — Z9181 History of falling: Secondary | ICD-10-CM

## 2020-09-16 NOTE — Therapy (Signed)
[facility] [place] [facility] [address]  [address] [place], [place], [address] Phone: [phone]   Fax:  [phone]  Physical Therapy Treatment  Patient Details  Name: Andre [name] MRN: [id number] Date of Birth: 10/28/1940 Referring Provider (PT): Michael Piazza, MD (Michael Kalish, MD (PCP))   Encounter Date: 09/16/2020   PT End of Session - 09/16/20 1317    Visit Number 14    Number of Visits 16    Date for PT Re-Evaluation 09/23/20    Authorization Type [employer] Medicare    Progress Note Due on Visit --   end of POC - PN on visit #9   PT Start Time 1317    PT Stop Time 1359    PT Time Calculation (min) 42 min    Equipment Utilized During Treatment Gait belt    Activity Tolerance Patient tolerated treatment well    Behavior During Therapy WFL for tasks assessed/performed           Past Medical History:  Diagnosis Date  . Carpal tunnel syndrome   . Cerebellar ataxia (HCC)    spinal cerebellar ataxia  . Dyslipidemia   . H/O rotator cuff surgery   . Obstructive sleep apnea     Past Surgical History:  Procedure Laterality Date  . BYPASS GRAFT AORTA TO AORTA     quadruple bypass  . CARPAL TUNNEL RELEASE Left   . EYE SURGERY Bilateral   . SHOULDER SURGERY     bilateral  . TONSILLECTOMY    . VASECTOMY      There were no vitals filed for this visit.   Subjective Assessment - 09/16/20 1319    Subjective Pt reports he saw the orthopedist this morning - hip was x-rayed and everything looks good.    Pertinent History 06/22/20 - R sided pelvic fracture s/p fall    Diagnostic tests 06/25/20 R hip MRI: positive for acute, nondisplaced fractures of the anterior wall and roof of the right acetabulum. There is also likely a nondisplaced fracture through the posterior aspect of the right inferior pubic ramus.  Mild right pectineus strain without tear.   06/26/20 CT pelvis: 1. The only fracture component well seen at CT is the anterior  acetabular wall fracture, but based on MRI there is also an inferior   pubic ramus fracture and some fracture extension into the superior acetabulum on the right. The appearance favors nondisplaced right anterior column fracture.   2. Low-grade edema along the right obturator internus and hip adductor musculature, better seen at MRI.   3. Prostatomegaly.   4. Sigmoid colon diverticulosis.   5. 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   6. Aortic atherosclerosis.    Patient Stated Goals "to improve my balance and strength in my legs"                             [facility] Adult PT Treatment/Exercise - 09/16/20 1317      Ambulation/Gait   Ambulation/Gait Assistance 4: Min guard;5: Supervision    Ambulation Distance (Feet) 270 Feet    Assistive device --   single hiking/trekking pole on R   Gait Pattern Step-through pattern;Ataxic   mildly ataxic   Ambulation Surface Level;Indoor      Exercises   Exercises Knee/Hip      Knee/Hip Exercises: Standing   SLS with Vectors L/R SLS with 3-way   tap to fwd cones x 10; 1 hiking pole for balance & CGA of PT               Balance Exercises - 09/16/20 1317      Balance Exercises: Standing   Tandem Stance Eyes open;2 reps;30 secs;Upper extremity support 1;Intermittent upper extremity support    Step Ups Forward;6 inch;UE support 1;Intermittent UE support   step-ups and step-up & over   Tandem Gait Forward;Retro;2 reps;Upper extremity support;Intermittent upper extremity support   10 ft along counter - light UE support   Partial Tandem Stance Eyes open;2 reps;30 secs    Other Standing Exercises Alt toes clears to 6" step x 20 - minimal to no UE support & CGA of PT               PT Short Term Goals - 08/28/20 1153      PT SHORT TERM GOAL #1   Title Patient will be independent with initial HEP    Status Achieved   08/12/20   Target Date 08/19/20       PT SHORT TERM GOAL #2   Title Patient will demonstrate safe transfer technique with RW to reduce fall risk    Status Achieved   08/28/20 - Pt better aware of safe hand placement during transfers and will self-correct if he tries to reach for the RW   Target Date 08/19/20      PT SHORT TERM GOAL #3   Title Patient will demonstrate safe gait pattern >500 ft with RW on all surfaces to reduce risk for falls    Status Achieved   08/28/20   Target Date 08/26/20      PT SHORT TERM GOAL #4   Title Patient will improve Berg score to >/= 38/56 to improve safety stability with ADLs in standing and reduce risk for falls    Status Achieved   08/28/20   Target Date 08/26/20      PT SHORT TERM GOAL #5   Title Patient will safely negotiate stairs with handraill support to allow safe access to bedroom and home egress    Status Achieved   08/28/20   Target Date 08/26/20             PT Long Term Goals - 09/11/20 1230      PT LONG TERM GOAL #1   Title Patient will be independent with ongoing/advanced HEP +/- gym program for self-management at home    Status Partially Met    Target Date 09/23/20      PT LONG TERM GOAL #2   Title Patient will demonstrate improved B LE strength to >/= 4+/5 for improved stability and ease of mobility    Status Partially Met   09/11/20 - met except L ankle and isolated motions in B hips   Target Date 09/23/20      PT LONG TERM GOAL #3   Title Patient to demonstrate symmetrical step length, good foot clearance, and good heel-toe pattern with upright posture when ambulating with SPC or walking poles    Status On-going    Target Date 09/23/20      PT LONG TERM GOAL #4   Title Patient will demonstrate decreased TUG time to </= 15 sec with LRAD to decrease risk for falls with transitional mobility    Status On-going    Target Date 09/23/20      PT LONG TERM GOAL #5   Title Patient will improve Berg score to >/=   45/56 to improve safety stability with ADLs in standing and  reduce risk for falls    Status On-going    Target Date 09/23/20                 Plan - 09/16/20 1322    Clinical Impression Statement Andre [name] reports he feels that his walking balance is improving, and he has started trying to walk while carrying his walker hovering above the ground to see how he does - discouraged this but did attempt gait with single hiking/trekking pole with improved stability and LOB observed. He feels that his strength is doing well but feels like his greatest issue is still with his balance, therefore therapeutic activities focused on static and dynamic balance tasks. Improved speed of reciprocal movements observed but ataxia still limiting coordination and balance with many tasks. He is nearing the end of the current POC, therefore will plan for goal assessment next visit including standardized balance testing to determine readiness for transition to HEP vs need for recert for continued PT.    Comorbidities chronic spinal cerebellar ataxia, neuropathy, CAD s/p CABG x 4, Aflutter, osteopenia, B shoulder surgery, carpal tunnel release, OSA    Rehab Potential Good    PT Frequency 2x / week    PT Duration 8 weeks    PT Treatment/Interventions ADLs/Self Care Home Management;DME Instruction;Gait training;Stair training;Functional mobility training;Therapeutic activities;Therapeutic exercise;Balance training;Neuromuscular re-education;Patient/family education;Manual techniques;Passive range of motion;Dry needling    PT Next Visit Plan goal assessment including standardized balance testing to determine readiness for transition to HEP vs need for recert; hip flexibilty & LE strengthening - emphasis on balance and functional movement pattterns; core/postural strengthening; balance training; gait training with trekking/hiking poles    PT Home Exercise Plan MedBridge Access Codes: [id number] 08/04/22);  [id number] (4/18);  [email or web address] (4/27)    Consulted and Agree with Plan of Care Patient            Patient will benefit from skilled therapeutic intervention in order to improve the following deficits and impairments:  Abnormal gait,Decreased activity tolerance,Decreased balance,Decreased coordination,Decreased endurance,Decreased knowledge of precautions,Decreased knowledge of use of DME,Decreased mobility,Decreased safety awareness,Decreased strength,Difficulty walking,Impaired perceived functional ability,Impaired flexibility,Improper body mechanics,Postural dysfunction  Visit Diagnosis: Unsteadiness on feet  Ataxic gait  Muscle weakness (generalized)  History of falling     Problem List Patient Active Problem List   Diagnosis Date Noted  . Neuropathy 12/12/2018  . Cerebellar ataxia ([place]) 12/12/2018  . Vitreous membranes and strands 10/20/2018  . Bilateral cold feet 04/07/2017  . Osteopenia of multiple sites 04/07/2017  . Vitamin D deficiency 04/07/2017  . CAD in native artery 03/09/2017  . Old MI (myocardial infarction) 03/09/2017  . Carpal tunnel syndrome, 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  . 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 PVC's (premature ventricular contractions) 10/09/2015  . Chronic dryness of both eyes 09/26/2015  . Hypermetropia 09/26/2015  . Presbyopia 09/26/2015  . Regular astigmatism 09/26/2015  . Hypermetropia of both eyes 09/26/2015  . Senile nuclear sclerosis 09/26/2015  . Stationary peripheral pterygium of right eye 09/26/2015  . Unspecified esotropia 09/26/2015  . Ventral hernia without obstruction or gangrene 07/30/2015  . Dyslipidemia 05/09/2015  . OSA (obstructive sleep apnea) 05/09/2015  . 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 Unspecified atrial flutter ([place]) 05/09/2015    Percival Spanish, PT, MPT 09/16/2020, 6:26 PM  [facility] [address]  [address] [place], [place], [address] Phone: [phone]   Fax:  [phone]  Name: Andre [name] MRN:  [id number] Date of Birth: 04/11/1941   

## 2020-09-19 ENCOUNTER — Encounter: Payer: Self-pay | Admitting: Physical Therapy

## 2020-09-19 ENCOUNTER — Other Ambulatory Visit: Payer: Self-pay

## 2020-09-19 ENCOUNTER — Ambulatory Visit: Payer: Medicare HMO | Admitting: Physical Therapy

## 2020-09-19 DIAGNOSIS — R2681 Unsteadiness on feet: Secondary | ICD-10-CM | POA: Diagnosis not present

## 2020-09-19 DIAGNOSIS — R260 Ataxic gait: Secondary | ICD-10-CM

## 2020-09-19 DIAGNOSIS — M6281 Muscle weakness (generalized): Secondary | ICD-10-CM

## 2020-09-19 DIAGNOSIS — Z9181 History of falling: Secondary | ICD-10-CM

## 2020-09-19 NOTE — Therapy (Signed)
[facility] [address]  [place], [place], [address] Phone: [phone]   Fax:  [phone]  Physical Therapy Treatment / Recert  Patient Details  Name: Andre [name] MRN: [id number] Date of Birth: Oct 25, 1940 Referring Provider (PT): Merilynn Finland, MD  Progress Note  Reporting Period 08/28/2020 to 09/19/2020  See note below for Objective Data and Assessment of Progress/Goals.      Encounter Date: 09/19/2020   PT End of Session - 09/19/20 1318    Visit Number 15    Number of Visits 27    Date for PT Re-Evaluation 10/31/20    Authorization Type [employer] Medicare    Progress Note Due on Visit 25   Recert on visit #80 (9/98/33)   PT Start Time 1318    PT Stop Time 1358    PT Time Calculation (min) 40 min    Equipment Utilized During Treatment Gait belt    Activity Tolerance Patient tolerated treatment well    Behavior During Therapy WFL for tasks assessed/performed           Past Medical History:  Diagnosis Date  . Carpal tunnel syndrome   . Cerebellar ataxia (HCC)    spinal cerebellar ataxia  . Dyslipidemia   . H/O rotator cuff surgery   . Obstructive sleep apnea     Past Surgical History:  Procedure Laterality Date  . BYPASS GRAFT AORTA TO AORTA     quadruple bypass  . CARPAL TUNNEL RELEASE Left   . EYE SURGERY Bilateral   . SHOULDER SURGERY     bilateral  . TONSILLECTOMY    . VASECTOMY      There were no vitals filed for this visit.   Subjective Assessment - 09/19/20 1320    Subjective Doing well today but notes his balance and stamina is still not what it was before the fall and pelvic fracture.    Pertinent History 06/22/20 - R sided pelvic fracture s/p fall    Diagnostic tests 06/25/20 R hip MRI: positive for acute, nondisplaced fractures of the anterior wall and roof of the right acetabulum. There is also likely a nondisplaced fracture through the posterior aspect of the right inferior  pubic ramus.  Mild right pectineus strain without tear.   06/26/20 CT pelvis: 1. The only fracture component well seen at CT is the anterior acetabular wall fracture, but based on MRI there is also an inferior   pubic ramus fracture and some fracture extension into the superior acetabulum on the right. The appearance favors nondisplaced right anterior column fracture.   2. Low-grade edema along the right obturator internus and hip adductor musculature, better seen at MRI.   3. Prostatomegaly.   4. Sigmoid colon diverticulosis.   5. 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   6. Aortic atherosclerosis.    Patient Stated Goals "to improve my balance and strength in my legs"    Currently in Pain? No/denies              [facility] PT Assessment - 09/19/20 1318      Assessment   Medical Diagnosis Unsteadiness on feet s/p fall with R-sided pelvic fracture    Referring Provider (PT) Merilynn Finland, MD    Onset Date/Surgical Date 06/22/20    Next MD Visit 11/06/20 - Dr. Jeralene Huff      Prior Function   Level of Independence Independent    Vocation Retired  Leisure walking 3x/wk; exercise equip including recumbent bike in basement 3/wk; had been going to the [place] at [facility] prior to pandemic      Strength   Right Hip Flexion 4/5    Right Hip Extension 4+/5    Right Hip External Rotation  5/5    Right Hip Internal Rotation 5/5    Right Hip ABduction 4/5    Right Hip ADduction 4+/5    Left Hip Flexion 4/5    Left Hip Extension 4/5    Left Hip External Rotation 5/5    Left Hip Internal Rotation 5/5    Left Hip ABduction 4+/5    Left Hip ADduction 4+/5    Right Knee Flexion 5/5    Right Knee Extension 5/5    Left Knee Flexion 5/5    Left Knee Extension 5/5    Right Ankle Dorsiflexion 4+/5    Right Ankle Plantar Flexion 4/5    Left Ankle Dorsiflexion 4/5    Left Ankle Plantar Flexion 3/5      Ambulation/Gait   Gait  velocity 3.37 ft/sec with single hiking pole; 3.61 ft/sec with RW      Standardized Balance Assessment   10 Meter Walk 9.74 sec with single hiking pole; 9.07 sec with RW      Berg Balance Test   Sit to Stand Able to stand without using hands and stabilize independently    Standing Unsupported Able to stand safely 2 minutes    Sitting with Back Unsupported but Feet Supported on Floor or Stool Able to sit safely and securely 2 minutes    Stand to Sit Sits safely with minimal use of hands    Transfers Able to transfer safely, minor use of hands    Standing Unsupported with Eyes Closed Able to stand 3 seconds    Standing Unsupported with Feet Together Able to place feet together independently and stand for 1 minute with supervision    From Standing, Reach Forward with Outstretched Arm Can reach forward >12 cm safely (5")    From Standing Position, Pick up Object from Floor Able to pick up shoe safely and easily    From Standing Position, Turn to Look Behind Over each Shoulder Looks behind one side only/other side shows less weight shift    Turn 360 Degrees Needs assistance while turning    Standing Unsupported, Alternately Place Feet on Step/Stool Able to stand independently and complete 8 steps >20 seconds    Standing Unsupported, One Foot in Front Able to take small step independently and hold 30 seconds    Standing on One Leg Able to lift leg independently and hold equal to or more than 3 seconds    Total Score 42    Berg comment: 37-45 significant fall risk  (>80%)      Timed Up and Go Test   Normal TUG (seconds) 15.15   with RW; 12.28 sec with single hiking pole                        [facility] Adult PT Treatment/Exercise - 09/19/20 1318      Ambulation/Gait   Ambulation/Gait Assistance 5: Supervision;4: Min guard    Ambulation Distance (Feet) 270 Feet   with hiking pole   Assistive device Rolling walker   single hiking/trekking pole on R   Gait Pattern Step-through  pattern;Ataxic   mildly ataxic   Ambulation Surface Level;Indoor      Knee/Hip Exercises: Aerobic  Recumbent Bike L4 x 67mn                    PT Short Term Goals - 08/28/20 1153      PT SHORT TERM GOAL #1   Title Patient will be independent with initial HEP    Status Achieved   08/12/20   Target Date 08/19/20      PT SHORT TERM GOAL #2   Title Patient will demonstrate safe transfer technique with RW to reduce fall risk    Status Achieved   08/28/20 - Pt better aware of safe hand placement during transfers and will self-correct if he tries to reach for the RW   Target Date 08/19/20      PT SHORT TERM GOAL #3   Title Patient will demonstrate safe gait pattern >500 ft with RW on all surfaces to reduce risk for falls    Status Achieved   08/28/20   Target Date 08/26/20      PT SHORT TERM GOAL #4   Title Patient will improve Berg score to >/= 38/56 to improve safety stability with ADLs in standing and reduce risk for falls    Status Achieved   08/28/20   Target Date 08/26/20      PT SHORT TERM GOAL #5   Title Patient will safely negotiate stairs with handraill support to allow safe access to bedroom and home egress    Status Achieved   08/28/20   Target Date 08/26/20             PT Long Term Goals - 09/19/20 1323      PT LONG TERM GOAL #1   Title Patient will be independent with ongoing/advanced HEP +/- gym program for self-management at home    Status Partially Met    Target Date 10/31/20      PT LONG TERM GOAL #2   Title Patient will demonstrate improved B LE strength to >/= 4+/5 for improved stability and ease of mobility    Status Partially Met   09/11/20 - met except L ankle and isolated motions in B hips   Target Date 10/31/20      PT LONG TERM GOAL #3   Title Patient to demonstrate symmetrical step length, good foot clearance, and good heel-toe pattern with upright posture when ambulating with SPC or walking poles    Status Partially Met   09/19/20:  symmetrical step length with good foot clearance and heel-toe pattern but intermittent sway resulting in min/mod instability   Target Date 10/31/20      PT LONG TERM GOAL #4   Title Patient will demonstrate decreased TUG time to </= 15 sec with LRAD to decrease risk for falls with transitional mobility    Baseline 21.53 sec with RW    Status Achieved   09/19/20: TUG = 15.15 sec with RW; 12.28 sec with single hiking pole     PT LONG TERM GOAL #5   Title Patient will improve Berg score to >/= 45/56 to improve safety stability with ADLs in standing and reduce risk for falls    Baseline 32/56    Status Partially Met   09/19/20: Berg = 42/56   Target Date 10/31/20                 Plan - 09/19/20 1323    Clinical Impression Statement RCaiois progressing well with PT with improving strength and gait stability but feels that his balance and stamina is  not quite back to baseline. Overall LE strength has improved with majority of LE MMT strength >/= 4+/5 with remaining weakness predominantly in L ankle and proximally in B hips. Balance and fall risk improving as evidenced by decreased TUG time (15.15 sec from 21.53 sec with RW), increased gait speed (3.61 ft/sec from 2.48 ft/sec with RW) and improved Berg (42/56 from 32/56). All STGs have been met and good progress noted with all LTGs - TUG goal met and remaining LTGs at least partially met. Nicklous has good potential to continue to benefit from skilled PT to further improve his strength and balance for improved gait stability and decreased risk for future falls, therefor will recommend recert for additional 2x/wk for 4-6 weeks.    Personal Factors and Comorbidities Comorbidity 3+;Age;Past/Current Experience;Time since onset of injury/illness/exacerbation    Comorbidities chronic spinal cerebellar ataxia, neuropathy, CAD s/p CABG x 4, Aflutter, osteopenia, B shoulder surgery, carpal tunnel release, OSA    Examination-Activity Limitations Bed  Mobility;Bend;Caring for Others;Locomotion Level;Transfers;Stand;Stairs;Squat;Toileting    Examination-Participation Restrictions Community Activity;Driving;Interpersonal Relationship;Meal Prep;Shop;Yard Work    Designer, multimedia    PT Frequency 2x / week    PT Duration 6 weeks   4-6 weeks   PT Treatment/Interventions ADLs/Self Care Home Management;DME Instruction;Gait training;Stair training;Functional mobility training;Therapeutic activities;Therapeutic exercise;Balance training;Neuromuscular re-education;Patient/family education;Manual techniques;Passive range of motion;Dry needling    PT Next Visit Plan hip flexibilty & LE strengthening - emphasis on balance and functional movement pattterns; core/postural strengthening; balance training; gait training with trekking/hiking poles    PT Home Exercise Plan MedBridge Access Codes: [id number] 09-Aug-2022);  [id number] (4/18);  [email or web address] (4/27)    Consulted and Agree with Plan of Care Patient           Patient will benefit from skilled therapeutic intervention in order to improve the following deficits and impairments:  Abnormal gait,Decreased activity tolerance,Decreased balance,Decreased coordination,Decreased endurance,Decreased knowledge of precautions,Decreased knowledge of use of DME,Decreased mobility,Decreased safety awareness,Decreased strength,Difficulty walking,Impaired perceived functional ability,Impaired flexibility,Improper body mechanics,Postural dysfunction  Visit Diagnosis: Unsteadiness on feet  Ataxic gait  Muscle weakness (generalized)  History of falling     Problem List Patient Active Problem List   Diagnosis Date Noted  . Neuropathy 12/12/2018  . Cerebellar ataxia ([place]) 12/12/2018  . Vitreous membranes and strands 10/20/2018  . Bilateral cold feet 04/07/2017  . Osteopenia of multiple sites 04/07/2017  . Vitamin D deficiency 04/07/2017  . CAD in native artery 03/09/2017  . Old MI (myocardial infarction) 03/09/2017  .  Carpal tunnel syndrome, 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  . 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 PVC's (premature ventricular contractions) 10/09/2015  . Chronic dryness of both eyes 09/26/2015  . Hypermetropia 09/26/2015  . Presbyopia 09/26/2015  . Regular astigmatism 09/26/2015  . Hypermetropia of both eyes 09/26/2015  . Senile nuclear sclerosis 09/26/2015  . Stationary peripheral pterygium of right eye 09/26/2015  . Unspecified esotropia 09/26/2015  . Ventral hernia without obstruction or gangrene 07/30/2015  . Dyslipidemia 05/09/2015  . OSA (obstructive sleep apnea) 05/09/2015  . 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 Unspecified atrial flutter ([place]) 05/09/2015    Mechele Claude Nolon Rod 09/19/2020, 5:03 PM  [facility] [address]  [place], [place], [address] Phone: [phone]   Fax:  [phone]  Name: SHOURYA MACPHERSON MRN: [id number] Date of Birth: 1941/03/06

## 2020-09-24 ENCOUNTER — Other Ambulatory Visit: Payer: Self-pay

## 2020-09-24 ENCOUNTER — Ambulatory Visit: Payer: Medicare HMO | Attending: Internal Medicine

## 2020-09-24 DIAGNOSIS — R2681 Unsteadiness on feet: Secondary | ICD-10-CM | POA: Insufficient documentation

## 2020-09-24 DIAGNOSIS — R260 Ataxic gait: Secondary | ICD-10-CM | POA: Insufficient documentation

## 2020-09-24 DIAGNOSIS — Z9181 History of falling: Secondary | ICD-10-CM | POA: Insufficient documentation

## 2020-09-24 DIAGNOSIS — M6281 Muscle weakness (generalized): Secondary | ICD-10-CM | POA: Insufficient documentation

## 2020-09-24 NOTE — Therapy (Signed)
[facility] [address]  [place], [place], [address] Phone: [phone]   Fax:  [phone]  Physical Therapy Treatment  Patient Details  Name: Andre [name] MRN: [id number] Date of Birth: 04-29-1940 Referring Provider (PT): Merilynn Finland, MD   Encounter Date: 09/24/2020   PT End of Session - 09/24/20 1616    Visit Number 16    Number of Visits 27    Date for PT Re-Evaluation 10/31/20    Authorization Type [employer]na Medicare    Progress Note Due on Visit 25    PT Start Time 1531    PT Stop Time 1610    PT Time Calculation (min) 39 min    Equipment Utilized During Treatment Gait belt    Activity Tolerance Patient tolerated treatment well    Behavior During Therapy WFL for tasks assessed/performed           Past Medical History:  Diagnosis Date  . Carpal tunnel syndrome   . Cerebellar ataxia (HCC)    spinal cerebellar ataxia  . Dyslipidemia   . H/O rotator cuff surgery   . Obstructive sleep apnea     Past Surgical History:  Procedure Laterality Date  . BYPASS GRAFT AORTA TO AORTA     quadruple bypass  . CARPAL TUNNEL RELEASE Left   . EYE SURGERY Bilateral   . SHOULDER SURGERY     bilateral  . TONSILLECTOMY    . VASECTOMY      There were no vitals filed for this visit.   Subjective Assessment - 09/24/20 1536    Subjective Pt reports improvement in balance, he believes his ataxia is a big limitation    Pertinent History 06/22/20 - R sided pelvic fracture s/p fall    Diagnostic tests 06/25/20 R hip MRI: positive for acute, nondisplaced fractures of the anterior wall and roof of the right acetabulum. There is also likely a nondisplaced fracture through the posterior aspect of the right inferior pubic ramus.  Mild right pectineus strain without tear.   06/26/20 CT pelvis: 1. The only fracture component well seen at CT is the anterior acetabular wall fracture, but based on MRI there is also an inferior    pubic ramus fracture and some fracture extension into the superior acetabulum on the right. The appearance favors nondisplaced right anterior column fracture.   2. Low-grade edema along the right obturator internus and hip adductor musculature, better seen at MRI.   3. Prostatomegaly.   4. Sigmoid colon diverticulosis.   5. 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   6. Aortic atherosclerosis.    Patient Stated Goals "to improve my balance and strength in my legs"    Currently in Pain? No/denies                             [facility] Adult PT Treatment/Exercise - 09/24/20 0001      Exercises   Exercises Knee/Hip      Knee/Hip Exercises: Aerobic   Recumbent Bike L4 x 37mn      Knee/Hip Exercises: Machines for Strengthening   Cybex Knee Extension 25# 2x10    Cybex Knee Flexion 35# 2x10      Knee/Hip Exercises: Seated   Sit to Sand 10 reps;without UE support   yellow medicine ball, CGA  Balance Exercises - 09/24/20 0001      Balance Exercises: Standing   Standing Eyes Opened Narrow base of support (BOS);Solid surface;Foam/compliant surface;2 reps   trunk rotations 10 reps each way, 1 set on solid surface, 2nd set on ariex pad   Tandem Gait Forward;Upper extremity support   along the counter, some occasional swaying   Other Standing Exercises walking in between cones 5x with [facility] for assistance               PT Short Term Goals - 08/28/20 1153      PT SHORT TERM GOAL #1   Title Patient will be independent with initial HEP    Status Achieved   08/12/20   Target Date 08/19/20      PT SHORT TERM GOAL #2   Title Patient will demonstrate safe transfer technique with RW to reduce fall risk    Status Achieved   08/28/20 - Pt better aware of safe hand placement during transfers and will self-correct if he tries to reach for the RW   Target Date 08/19/20      PT SHORT TERM GOAL #3    Title Patient will demonstrate safe gait pattern >500 ft with RW on all surfaces to reduce risk for falls    Status Achieved   08/28/20   Target Date 08/26/20      PT SHORT TERM GOAL #4   Title Patient will improve Berg score to >/= 38/56 to improve safety stability with ADLs in standing and reduce risk for falls    Status Achieved   08/28/20   Target Date 08/26/20      PT SHORT TERM GOAL #5   Title Patient will safely negotiate stairs with handraill support to allow safe access to bedroom and home egress    Status Achieved   08/28/20   Target Date 08/26/20             PT Long Term Goals - 09/19/20 1323      PT LONG TERM GOAL #1   Title Patient will be independent with ongoing/advanced HEP +/- gym program for self-management at home    Status Partially Met    Target Date 10/31/20      PT LONG TERM GOAL #2   Title Patient will demonstrate improved B LE strength to >/= 4+/5 for improved stability and ease of mobility    Status Partially Met   09/11/20 - met except L ankle and isolated motions in B hips   Target Date 10/31/20      PT LONG TERM GOAL #3   Title Patient to demonstrate symmetrical step length, good foot clearance, and good heel-toe pattern with upright posture when ambulating with SPC or walking poles    Status Partially Met   09/19/20: symmetrical step length with good foot clearance and heel-toe pattern but intermittent sway resulting in min/mod instability   Target Date 10/31/20      PT LONG TERM GOAL #4   Title Patient will demonstrate decreased TUG time to </= 15 sec with LRAD to decrease risk for falls with transitional mobility    Baseline 21.53 sec with RW    Status Achieved   09/19/20: TUG = 15.15 sec with RW; 12.28 sec with single hiking pole     PT LONG TERM GOAL #5   Title Patient will improve Berg score to >/= 45/56 to improve safety stability with ADLs in standing and reduce risk for falls    Baseline 32/56  Status Partially Met   09/19/20: Berg = 42/56    Target Date 10/31/20                 Plan - 09/24/20 1617    Clinical Impression Statement Pt overall responded well. He continues to show limitations with walking through cones and tandem walking. Occassional swaying during the more dynamic activities and utilized a SPC to provide stability during the weaning through cones. He was able to complete STS with a medicine ball but required CGA for balance and stability. Overall he responded well and will try to incorporate more WS exercises and dynamic balance.    Personal Factors and Comorbidities Comorbidity 3+;Age;Past/Current Experience;Time since onset of injury/illness/exacerbation    Comorbidities chronic spinal cerebellar ataxia, neuropathy, CAD s/p CABG x 4, Aflutter, osteopenia, B shoulder surgery, carpal tunnel release, OSA    PT Frequency 2x / week    PT Duration 6 weeks    PT Treatment/Interventions ADLs/Self Care Home Management;DME Instruction;Gait training;Stair training;Functional mobility training;Therapeutic activities;Therapeutic exercise;Balance training;Neuromuscular re-education;Patient/family education;Manual techniques;Passive range of motion;Dry needling    PT Next Visit Plan hip flexibilty & LE strengthening - emphasis on balance and functional movement pattterns; core/postural strengthening; balance training; gait training with trekking/hiking poles    PT Home Exercise Plan MedBridge Access Codes: [id number] 2022/08/22);  [id number] (4/18);  [id number] (4/27)    Consulted and Agree with Plan of Care Patient           Patient will benefit from skilled therapeutic intervention in order to improve the following deficits and impairments:  Abnormal gait,Decreased activity tolerance,Decreased balance,Decreased coordination,Decreased endurance,Decreased knowledge of precautions,Decreased knowledge of use of DME,Decreased mobility,Decreased safety awareness,Decreased strength,Difficulty walking,Impaired perceived functional  ability,Impaired flexibility,Improper body mechanics,Postural dysfunction  Visit Diagnosis: Unsteadiness on feet  Ataxic gait  Muscle weakness (generalized)  History of falling     Problem List Patient Active Problem List   Diagnosis Date Noted  . Neuropathy 12/12/2018  . Cerebellar ataxia ([place]) 12/12/2018  . Vitreous membranes and strands 10/20/2018  . Bilateral cold feet 04/07/2017  . Osteopenia of multiple sites 04/07/2017  . Vitamin D deficiency 04/07/2017  . CAD in native artery 03/09/2017  . Old MI (myocardial infarction) 03/09/2017  . Carpal tunnel syndrome, 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  . 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 PVC's (premature ventricular contractions) 10/09/2015  . Chronic dryness of both eyes 09/26/2015  . Hypermetropia 09/26/2015  . Presbyopia 09/26/2015  . Regular astigmatism 09/26/2015  . Hypermetropia of both eyes 09/26/2015  . Senile nuclear sclerosis 09/26/2015  . Stationary peripheral pterygium of right eye 09/26/2015  . Unspecified esotropia 09/26/2015  . Ventral hernia without obstruction or gangrene 07/30/2015  . Dyslipidemia 05/09/2015  . OSA (obstructive sleep apnea) 05/09/2015  . 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 Unspecified atrial flutter ([place]) 05/09/2015    Artist Pais, PTA 09/24/2020, 4:29 PM  [facility] [address]  [place], [place], [address] Phone: [phone]   Fax:  [phone]  Name: SAVVAS ROPER MRN: [id number] Date of Birth: 1941/04/04

## 2020-09-26 ENCOUNTER — Ambulatory Visit: Payer: Medicare HMO | Attending: Internal Medicine

## 2020-09-26 ENCOUNTER — Other Ambulatory Visit: Payer: Self-pay

## 2020-09-26 DIAGNOSIS — Z9181 History of falling: Secondary | ICD-10-CM | POA: Insufficient documentation

## 2020-09-26 DIAGNOSIS — R260 Ataxic gait: Secondary | ICD-10-CM | POA: Diagnosis present

## 2020-09-26 DIAGNOSIS — M6281 Muscle weakness (generalized): Secondary | ICD-10-CM | POA: Insufficient documentation

## 2020-09-26 DIAGNOSIS — R2681 Unsteadiness on feet: Secondary | ICD-10-CM | POA: Insufficient documentation

## 2020-09-26 NOTE — Therapy (Signed)
[facility] [address]  [place], [place], [address] Phone: [phone]   Fax:  [phone]  Physical Therapy Treatment  Patient Details  Name: Andre [name] MRN: [id number] Date of Birth: 08/29/1940 Referring Provider (PT): Merilynn Finland, MD   Encounter Date: 09/26/2020   PT End of Session - 09/26/20 1409    Visit Number 17    Number of Visits 27    Date for PT Re-Evaluation 10/31/20    Authorization Type [employer]    Progress Note Due on Visit 25    PT Start Time 1317    PT Stop Time 1400    PT Time Calculation (min) 43 min    Equipment Utilized During Treatment Gait belt    Activity Tolerance Patient tolerated treatment well    Behavior During Therapy [facility] for tasks assessed/performed           Past Medical History:  Diagnosis Date  . Carpal tunnel syndrome   . Cerebellar ataxia (HCC)    spinal cerebellar ataxia  . Dyslipidemia   . H/O rotator cuff surgery   . Obstructive sleep apnea     Past Surgical History:  Procedure Laterality Date  . BYPASS GRAFT AORTA TO AORTA     quadruple bypass  . CARPAL TUNNEL RELEASE Left   . EYE SURGERY Bilateral   . SHOULDER SURGERY     bilateral  . TONSILLECTOMY    . VASECTOMY      There were no vitals filed for this visit.   Subjective Assessment - 09/26/20 1318    Subjective No changes since last session.    Pertinent History 06/22/20 - R sided pelvic fracture s/p fall    Diagnostic tests 06/25/20 R hip MRI: positive for acute, nondisplaced fractures of the anterior wall and roof of the right acetabulum. There is also likely a nondisplaced fracture through the posterior aspect of the right inferior pubic ramus.  Mild right pectineus strain without tear.   06/26/20 CT pelvis: 1. The only fracture component well seen at CT is the anterior acetabular wall fracture, but based on MRI there is also an inferior   pubic ramus fracture and some fracture extension  into the superior acetabulum on the right. The appearance favors nondisplaced right anterior column fracture.   2. Low-grade edema along the right obturator internus and hip adductor musculature, better seen at MRI.   3. Prostatomegaly.   4. Sigmoid colon diverticulosis.   5. 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   6. Aortic atherosclerosis.    Patient Stated Goals "to improve my balance and strength in my legs"    Currently in Pain? No/denies                             [facility] Adult PT Treatment/Exercise - 09/26/20 0001      Ambulation/Gait   Ambulation/Gait Yes    Ambulation/Gait Assistance 4: Min guard    Ambulation Distance (Feet) 270 Feet    Assistive device --   SPC   Gait Pattern Step-through pattern;Ataxic    Gait Comments occasional swaying with SPC d/t ataxia      Exercises   Exercises Knee/Hip;Lumbar      Lumbar Exercises: Seated   Other Seated Lumbar Exercises B shoulder flexion and horizontal ABD while sitting on orange pball, feet close together 10 reps  Lumbar Exercises: Quadruped   Other Quadruped Lumbar Exercises arm raises in bird dog position 6x, on the mat UE walkouts 5x   fatigue noted with arm raises     Knee/Hip Exercises: Aerobic   Recumbent Bike L4 x 47mn               Balance Exercises - 09/26/20 0001      Balance Exercises: Standing   Tandem Gait Forward;Upper extremity support;4 reps   more stability shown; along the counter   Other Standing Exercises walking in between cones and stepping over cones 5x40 ft each  with SPC for assistance               PT Short Term Goals - 08/28/20 1153      PT SHORT TERM GOAL #1   Title Patient will be independent with initial HEP    Status Achieved   08/12/20   Target Date 08/19/20      PT SHORT TERM GOAL #2   Title Patient will demonstrate safe transfer technique with RW to reduce fall risk    Status  Achieved   08/28/20 - Pt better aware of safe hand placement during transfers and will self-correct if he tries to reach for the RW   Target Date 08/19/20      PT SHORT TERM GOAL #3   Title Patient will demonstrate safe gait pattern >500 ft with RW on all surfaces to reduce risk for falls    Status Achieved   08/28/20   Target Date 08/26/20      PT SHORT TERM GOAL #4   Title Patient will improve Berg score to >/= 38/56 to improve safety stability with ADLs in standing and reduce risk for falls    Status Achieved   08/28/20   Target Date 08/26/20      PT SHORT TERM GOAL #5   Title Patient will safely negotiate stairs with handraill support to allow safe access to bedroom and home egress    Status Achieved   08/28/20   Target Date 08/26/20             PT Long Term Goals - 09/19/20 1323      PT LONG TERM GOAL #1   Title Patient will be independent with ongoing/advanced HEP +/- gym program for self-management at home    Status Partially Met    Target Date 10/31/20      PT LONG TERM GOAL #2   Title Patient will demonstrate improved B LE strength to >/= 4+/5 for improved stability and ease of mobility    Status Partially Met   09/11/20 - met except L ankle and isolated motions in B hips   Target Date 10/31/20      PT LONG TERM GOAL #3   Title Patient to demonstrate symmetrical step length, good foot clearance, and good heel-toe pattern with upright posture when ambulating with SPC or walking poles    Status Partially Met   09/19/20: symmetrical step length with good foot clearance and heel-toe pattern but intermittent sway resulting in min/mod instability   Target Date 10/31/20      PT LONG TERM GOAL #4   Title Patient will demonstrate decreased TUG time to </= 15 sec with LRAD to decrease risk for falls with transitional mobility    Baseline 21.53 sec with RW    Status Achieved   09/19/20: TUG = 15.15 sec with RW; 12.28 sec with single hiking pole  PT LONG TERM GOAL #5   Title  Patient will improve Berg score to >/= 45/56 to improve safety stability with ADLs in standing and reduce risk for falls    Baseline 32/56    Status Partially Met   09/19/20: Berg = 42/56   Target Date 10/31/20                 Plan - 09/26/20 1413    Clinical Impression Statement Pt still demonstrating occasional sway during gait with SPC d/t ataxia. He needs instruction and encouragement to pace himself with gait and to maintain static position with LOB to maintain stability. Pt showed improvements since last session with navigating around and over cones with less swaying and LOB. He showed some fatigue during the quadruped exercises, especially the arm raising in bird dog position but noted that it was a good workout. Overall pt responded well and is showing progress toward LTG 3.    Personal Factors and Comorbidities Comorbidity 3+;Age;Past/Current Experience;Time since onset of injury/illness/exacerbation    Comorbidities chronic spinal cerebellar ataxia, neuropathy, CAD s/p CABG x 4, Aflutter, osteopenia, B shoulder surgery, carpal tunnel release, OSA    PT Frequency 2x / week    PT Duration 6 weeks    PT Treatment/Interventions ADLs/Self Care Home Management;DME Instruction;Gait training;Stair training;Functional mobility training;Therapeutic activities;Therapeutic exercise;Balance training;Neuromuscular re-education;Patient/family education;Manual techniques;Passive range of motion;Dry needling    PT Next Visit Plan hip flexibilty & LE strengthening - emphasis on balance and functional movement pattterns; core/postural strengthening; balance training; gait training with trekking/hiking poles    PT Home Exercise Plan MedBridge Access Codes: [email or web address] 08/30/22);  [id number] (4/18);  [id number] (4/27)    Consulted and Agree with Plan of Care Patient    Family Member Consulted wife - Ginny           Patient will benefit from skilled therapeutic intervention in order to improve the following  deficits and impairments:  Abnormal gait,Decreased activity tolerance,Decreased balance,Decreased coordination,Decreased endurance,Decreased knowledge of precautions,Decreased knowledge of use of DME,Decreased mobility,Decreased safety awareness,Decreased strength,Difficulty walking,Impaired perceived functional ability,Impaired flexibility,Improper body mechanics,Postural dysfunction  Visit Diagnosis: Unsteadiness on feet  Ataxic gait  Muscle weakness (generalized)  History of falling     Problem List Patient Active Problem List   Diagnosis Date Noted  . Neuropathy 12/12/2018  . Cerebellar ataxia ([place]) 12/12/2018  . Vitreous membranes and strands 10/20/2018  . Bilateral cold feet 04/07/2017  . Osteopenia of multiple sites 04/07/2017  . Vitamin D deficiency 04/07/2017  . CAD in native artery 03/09/2017  . Old MI (myocardial infarction) 03/09/2017  . Carpal tunnel syndrome, 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  . 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 PVC's (premature ventricular contractions) 10/09/2015  . Chronic dryness of both eyes 09/26/2015  . Hypermetropia 09/26/2015  . Presbyopia 09/26/2015  . Regular astigmatism 09/26/2015  . Hypermetropia of both eyes 09/26/2015  . Senile nuclear sclerosis 09/26/2015  . Stationary peripheral pterygium of right eye 09/26/2015  . Unspecified esotropia 09/26/2015  . Ventral hernia without obstruction or gangrene 07/30/2015  . Dyslipidemia 05/09/2015  . OSA (obstructive sleep apnea) 05/09/2015  . 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 Unspecified atrial flutter ([place]) 05/09/2015    Artist Pais, PTA 09/26/2020, 2:20 PM  [facility] [address]  [place], [place], [address] Phone: [phone]   Fax:  [phone]  Name: ANA LIAW MRN: [id number] Date of Birth: 03-12-1941

## 2020-09-30 ENCOUNTER — Other Ambulatory Visit: Payer: Self-pay

## 2020-09-30 ENCOUNTER — Encounter: Payer: Self-pay | Admitting: Physical Therapy

## 2020-09-30 ENCOUNTER — Ambulatory Visit: Payer: Medicare HMO | Admitting: Physical Therapy

## 2020-09-30 DIAGNOSIS — R2681 Unsteadiness on feet: Secondary | ICD-10-CM | POA: Diagnosis not present

## 2020-09-30 DIAGNOSIS — R260 Ataxic gait: Secondary | ICD-10-CM

## 2020-09-30 DIAGNOSIS — Z9181 History of falling: Secondary | ICD-10-CM

## 2020-09-30 DIAGNOSIS — M6281 Muscle weakness (generalized): Secondary | ICD-10-CM

## 2020-09-30 NOTE — Therapy (Signed)
[facility] [address]  [place], [place], [address] Phone: [phone]   Fax:  [phone]  Physical Therapy Treatment  Patient Details  Name: Andre [name] MRN: [id number] Date of Birth: [birth year] Referring Provider (PT): Merilynn Finland, MD   Encounter Date: 09/30/2020   PT End of Session - 09/30/20 1021    Visit Number 18    Number of Visits 27    Date for PT Re-Evaluation 10/31/20    Authorization Type [employer] Medicare    Progress Note Due on Visit 25   Recert on visit #46 (6/59/93)   PT Start Time 1021    PT Stop Time 1103    PT Time Calculation (min) 42 min    Equipment Utilized During Treatment Gait belt    Activity Tolerance Patient tolerated treatment well    Behavior During Therapy WFL for tasks assessed/performed           Past Medical History:  Diagnosis Date  . Carpal tunnel syndrome   . Cerebellar ataxia (HCC)    spinal cerebellar ataxia  . Dyslipidemia   . H/O rotator cuff surgery   . Obstructive sleep apnea     Past Surgical History:  Procedure Laterality Date  . BYPASS GRAFT AORTA TO AORTA     quadruple bypass  . CARPAL TUNNEL RELEASE Left   . EYE SURGERY Bilateral   . SHOULDER SURGERY     bilateral  . TONSILLECTOMY    . VASECTOMY      There were no vitals filed for this visit.   Subjective Assessment - 09/30/20 1025    Subjective Pt states he thinks he is progressing a little bit with his balance.    Pertinent History 06/22/20 - R sided pelvic fracture s/p fall    Diagnostic tests 06/25/20 R hip MRI: positive for acute, nondisplaced fractures of the anterior wall and roof of the right acetabulum. There is also likely a nondisplaced fracture through the posterior aspect of the right inferior pubic ramus.  Mild right pectineus strain without tear.   06/26/20 CT pelvis: 1. The only fracture component well seen at CT is the anterior acetabular wall fracture, but based on MRI there is  also an inferior   pubic ramus fracture and some fracture extension into the superior acetabulum on the right. The appearance favors nondisplaced right anterior column fracture.   2. Low-grade edema along the right obturator internus and hip adductor musculature, better seen at MRI.   3. Prostatomegaly.   4. Sigmoid colon diverticulosis.   5. 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   6. Aortic atherosclerosis.    Patient Stated Goals "to improve my balance and strength in my legs"    Currently in Pain? No/denies                             [facility] Adult PT Treatment/Exercise - 09/30/20 1021      Ambulation/Gait   Ambulation/Gait Assistance 5: Supervision;4: Min guard    Ambulation/Gait Assistance Details cues for better fwd positioning of hiking poles    Ambulation Distance (Feet) 180 Feet   x 2   Assistive device --   B hiking/trekking poles x 180 ft & single hiking/trekking pole on R x 180 ft   Gait Pattern Step-through pattern;Ataxic    Gait Comments occasional lateral instability  Exercises   Exercises Knee/Hip      Lumbar Exercises: Quadruped   Straight Leg Raise 10 reps;2 seconds    Opposite Arm/Leg Raise Right arm/Left leg;Left arm/Right leg;5 reps;2 seconds    Opposite Arm/Leg Raise Limitations more difficulty with L arm/R leg lift d/t LOB to L      Knee/Hip Exercises: Aerobic   Recumbent Bike L4 x 40mn      Shoulder Exercises: Standing   Horizontal ABduction Both;10 reps;Theraband;Strengthening    Theraband Level (Shoulder Horizontal ABduction) Level 2 (Red)    Horizontal ABduction Limitations standing against pool noodle on wall    Diagonals Both;10 reps;Strengthening;Theraband    Theraband Level (Shoulder Diagonals) Level 2 (Red)    Diagonals Limitations standing against pool noodle on wall             PWR ([facility] - 09/30/20 1021    PWR! exercises Moves in standing     PWR! Up x10    PWR! Rock x10    PWR! Twist x10   using purple boom whackers   PWR Step x10   Alt PWR! Step with 1 hand support on back of chair x 10           Balance Exercises - 09/30/20 1021      Balance Exercises: Standing   Wall Bumps Shoulder;Hip;10 reps               PT Short Term Goals - 08/28/20 1153      PT SHORT TERM GOAL #1   Title Patient will be independent with initial HEP    Status Achieved   08/12/20   Target Date 08/19/20      PT SHORT TERM GOAL #2   Title Patient will demonstrate safe transfer technique with RW to reduce fall risk    Status Achieved   08/28/20 - Pt better aware of safe hand placement during transfers and will self-correct if he tries to reach for the RW   Target Date 08/19/20      PT SHORT TERM GOAL #3   Title Patient will demonstrate safe gait pattern >500 ft with RW on all surfaces to reduce risk for falls    Status Achieved   08/28/20   Target Date 08/26/20      PT SHORT TERM GOAL #4   Title Patient will improve Berg score to >/= 38/56 to improve safety stability with ADLs in standing and reduce risk for falls    Status Achieved   08/28/20   Target Date 08/26/20      PT SHORT TERM GOAL #5   Title Patient will safely negotiate stairs with handraill support to allow safe access to bedroom and home egress    Status Achieved   08/28/20   Target Date 08/26/20             PT Long Term Goals - 09/19/20 1323      PT LONG TERM GOAL #1   Title Patient will be independent with ongoing/advanced HEP +/- gym program for self-management at home    Status Partially Met    Target Date 10/31/20      PT LONG TERM GOAL #2   Title Patient will demonstrate improved B LE strength to >/= 4+/5 for improved stability and ease of mobility    Status Partially Met   09/11/20 - met except L ankle and isolated motions in B hips   Target Date 10/31/20      PT LONG TERM GOAL #3  Title Patient to demonstrate symmetrical step length, good foot clearance,  and good heel-toe pattern with upright posture when ambulating with SPC or walking poles    Status Partially Met   09/19/20: symmetrical step length with good foot clearance and heel-toe pattern but intermittent sway resulting in min/mod instability   Target Date 10/31/20      PT LONG TERM GOAL #4   Title Patient will demonstrate decreased TUG time to </= 15 sec with LRAD to decrease risk for falls with transitional mobility    Baseline 21.53 sec with RW    Status Achieved   09/19/20: TUG = 15.15 sec with RW; 12.28 sec with single hiking pole     PT LONG TERM GOAL #5   Title Patient will improve Berg score to >/= 45/56 to improve safety stability with ADLs in standing and reduce risk for falls    Baseline 32/56    Status Partially Met   09/19/20: Berg = 42/56   Target Date 10/31/20                 Plan - 09/30/20 1103    Clinical Impression Statement Continued focus on core and proximal stability as well as balance with progressing of strengthening, facilitation of balance strategies and incorporation of PWR! Moves in standing to facilitate improved posture, weight shift, rotational stability and stepping strategies to decreased risk for falls. Karter reports he has started walking loops in his driveway with his hiking poles but has not ventured beyond the driveway w/o his rollator at this point. Reassessment of gait with hiking/trekking poles continues to reveal limited forward placement of poles occasionally causing poles to slip backward as he steps - focused on forward movement/placement of poles both with bilateral and single pole use. Overall stability better with B poles but still will mild intermittent lateral instability especially with cornering due to ataxia.    Comorbidities chronic spinal cerebellar ataxia, neuropathy, CAD s/p CABG x 4, Aflutter, osteopenia, B shoulder surgery, carpal tunnel release, OSA    Rehab Potential Good    PT Frequency 2x / week    PT Duration 6 weeks     PT Treatment/Interventions ADLs/Self Care Home Management;DME Instruction;Gait training;Stair training;Functional mobility training;Therapeutic activities;Therapeutic exercise;Balance training;Neuromuscular re-education;Patient/family education;Manual techniques;Passive range of motion;Dry needling    PT Next Visit Plan hip flexibilty & LE strengthening - emphasis on balance and functional movement pattterns; core/postural strengthening; balance training; gait training with trekking/hiking poles    PT Home Exercise Plan MedBridge Access Codes: [email or web address] Aug 13, 2022);  [id number] (4/18);  [email or web address] (4/27)    Consulted and Agree with Plan of Care Patient    Family Member Consulted wife - Ginny           Patient will benefit from skilled therapeutic intervention in order to improve the following deficits and impairments:  Abnormal gait,Decreased activity tolerance,Decreased balance,Decreased coordination,Decreased endurance,Decreased knowledge of precautions,Decreased knowledge of use of DME,Decreased mobility,Decreased safety awareness,Decreased strength,Difficulty walking,Impaired perceived functional ability,Impaired flexibility,Improper body mechanics,Postural dysfunction  Visit Diagnosis: Unsteadiness on feet  Ataxic gait  Muscle weakness (generalized)  History of falling     Problem List Patient Active Problem List   Diagnosis Date Noted  . Neuropathy 12/12/2018  . Cerebellar ataxia ([place]) 12/12/2018  . Vitreous membranes and strands 10/20/2018  . Bilateral cold feet 04/07/2017  . Osteopenia of multiple sites 04/07/2017  . Vitamin D deficiency 04/07/2017  . CAD in native artery 03/09/2017  . Old MI (myocardial infarction) 03/09/2017  . Carpal tunnel syndrome,  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  . 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 PVC's (premature ventricular contractions) 10/09/2015  . Chronic dryness of both eyes 09/26/2015  . Hypermetropia 09/26/2015  . Presbyopia 09/26/2015  .  Regular astigmatism 09/26/2015  . Hypermetropia of both eyes 09/26/2015  . Senile nuclear sclerosis 09/26/2015  . Stationary peripheral pterygium of right eye 09/26/2015  . Unspecified esotropia 09/26/2015  . Ventral hernia without obstruction or gangrene 07/30/2015  . Dyslipidemia 05/09/2015  . OSA (obstructive sleep apnea) 05/09/2015  . 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 Unspecified atrial flutter ([place]) 05/09/2015    Percival Spanish, PT, MPT 09/30/2020, 1:51 PM  [facility] [address]  [address] [place], [place], [address] Phone: [phone]   Fax:  [phone]  Name: Andre [name] MRN: [id number] Date of Birth: [birth year]

## 2020-10-02 ENCOUNTER — Encounter: Payer: Self-pay | Admitting: Physical Therapy

## 2020-10-02 ENCOUNTER — Ambulatory Visit: Payer: Medicare HMO | Admitting: Physical Therapy

## 2020-10-02 ENCOUNTER — Other Ambulatory Visit: Payer: Self-pay

## 2020-10-02 DIAGNOSIS — R2681 Unsteadiness on feet: Secondary | ICD-10-CM

## 2020-10-02 DIAGNOSIS — Z9181 History of falling: Secondary | ICD-10-CM

## 2020-10-02 DIAGNOSIS — R260 Ataxic gait: Secondary | ICD-10-CM

## 2020-10-02 DIAGNOSIS — M6281 Muscle weakness (generalized): Secondary | ICD-10-CM

## 2020-10-02 NOTE — Therapy (Signed)
[facility] [address]  [place], [place], [address] Phone: [phone]   Fax:  [phone]  Physical Therapy Treatment  Patient Details  Name: Andre [name] MRN: [id number] Date of Birth: Sep 30, 1940 Referring Provider (PT): Merilynn Finland, MD   Encounter Date: 10/02/2020   PT End of Session - 10/02/20 1015    Visit Number 19    Number of Visits 27    Date for PT Re-Evaluation 10/31/20    Authorization Type [employer]    Progress Note Due on Visit 25   Recert on visit #03 (5/46/56)   PT Start Time 1015    PT Stop Time 1059    PT Time Calculation (min) 44 min    Equipment Utilized During Treatment Gait belt    Activity Tolerance Patient tolerated treatment well    Behavior During Therapy [facility] for tasks assessed/performed           Past Medical History:  Diagnosis Date  . Carpal tunnel syndrome   . Cerebellar ataxia (HCC)    spinal cerebellar ataxia  . Dyslipidemia   . H/O rotator cuff surgery   . Obstructive sleep apnea     Past Surgical History:  Procedure Laterality Date  . BYPASS GRAFT AORTA TO AORTA     quadruple bypass  . CARPAL TUNNEL RELEASE Left   . EYE SURGERY Bilateral   . SHOULDER SURGERY     bilateral  . TONSILLECTOMY    . VASECTOMY      There were no vitals filed for this visit.   Subjective Assessment - 10/02/20 1018    Subjective Pt states "nothing different than previously".    Pertinent History 06/22/20 - R sided pelvic fracture s/p fall    Diagnostic tests 06/25/20 R hip MRI: positive for acute, nondisplaced fractures of the anterior wall and roof of the right acetabulum. There is also likely a nondisplaced fracture through the posterior aspect of the right inferior pubic ramus.  Mild right pectineus strain without tear.   06/26/20 CT pelvis: 1. The only fracture component well seen at CT is the anterior acetabular wall fracture, but based on MRI there is also an inferior    pubic ramus fracture and some fracture extension into the superior acetabulum on the right. The appearance favors nondisplaced right anterior column fracture.   2. Low-grade edema along the right obturator internus and hip adductor musculature, better seen at MRI.   3. Prostatomegaly.   4. Sigmoid colon diverticulosis.   5. 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   6. Aortic atherosclerosis.    Patient Stated Goals "to improve my balance and strength in my legs"    Currently in Pain? No/denies                             [facility] Adult PT Treatment/Exercise - 10/02/20 1015      Exercises   Exercises Knee/Hip      Lumbar Exercises: Seated   Long Arc Quad on [place] Both;10 reps    LAQ on [place] Limitations red Pball    Hip Flexion on Ball Both;10 reps   2 sets   Hip Flexion on Ball Limitations red Pball; 2nd set + alt UE flexion    Other Seated Lumbar Exercises B shoulder horizontal ABD & UE diagonals with green TB while sitting on red Pball, feet  close together x 10 reps each      Knee/Hip Exercises: Aerobic   Recumbent Bike L5 x 6 min      Knee/Hip Exercises: Standing   Other Standing Knee Exercises B side stepping along counter with looped red, then green TB at ankles 2 x 10 ft each color; Fwd/back monster walk along counter with looped green TB at ankles 2 x 10 ft  each direction   intermittent UE support on counter              Balance Exercises - 10/02/20 1015      Balance Exercises: Standing   Standing Eyes Opened Narrow base of support (BOS);Foam/compliant surface;Head turns;5 reps   horizontal & vertical head turns, upper trunk rotation   Standing Eyes Closed Narrow base of support (BOS);Foam/compliant surface;10 secs;3 reps    Standing, One Foot on a Step 8 inch;Eyes open;20 secs;Head turns;5 reps;Foam/compliant surface;2 reps;10 secs               PT Short Term Goals - 08/28/20  1153      PT SHORT TERM GOAL #1   Title Patient will be independent with initial HEP    Status Achieved   08/12/20   Target Date 08/19/20      PT SHORT TERM GOAL #2   Title Patient will demonstrate safe transfer technique with RW to reduce fall risk    Status Achieved   08/28/20 - Pt better aware of safe hand placement during transfers and will self-correct if he tries to reach for the RW   Target Date 08/19/20      PT SHORT TERM GOAL #3   Title Patient will demonstrate safe gait pattern >500 ft with RW on all surfaces to reduce risk for falls    Status Achieved   08/28/20   Target Date 08/26/20      PT SHORT TERM GOAL #4   Title Patient will improve Berg score to >/= 38/56 to improve safety stability with ADLs in standing and reduce risk for falls    Status Achieved   08/28/20   Target Date 08/26/20      PT SHORT TERM GOAL #5   Title Patient will safely negotiate stairs with handraill support to allow safe access to bedroom and home egress    Status Achieved   08/28/20   Target Date 08/26/20             PT Long Term Goals - 09/19/20 1323      PT LONG TERM GOAL #1   Title Patient will be independent with ongoing/advanced HEP +/- gym program for self-management at home    Status Partially Met    Target Date 10/31/20      PT LONG TERM GOAL #2   Title Patient will demonstrate improved B LE strength to >/= 4+/5 for improved stability and ease of mobility    Status Partially Met   09/11/20 - met except L ankle and isolated motions in B hips   Target Date 10/31/20      PT LONG TERM GOAL #3   Title Patient to demonstrate symmetrical step length, good foot clearance, and good heel-toe pattern with upright posture when ambulating with SPC or walking poles    Status Partially Met   09/19/20: symmetrical step length with good foot clearance and heel-toe pattern but intermittent sway resulting in min/mod instability   Target Date 10/31/20      PT LONG TERM GOAL #4  Title Patient will  demonstrate decreased TUG time to </= 15 sec with LRAD to decrease risk for falls with transitional mobility    Baseline 21.53 sec with RW    Status Achieved   09/19/20: TUG = 15.15 sec with RW; 12.28 sec with single hiking pole     PT LONG TERM GOAL #5   Title Patient will improve Berg score to >/= 45/56 to improve safety stability with ADLs in standing and reduce risk for falls    Baseline 32/56    Status Partially Met   09/19/20: Berg = 42/56   Target Date 10/31/20                 Plan - 10/02/20 1021    Clinical Impression Statement Javontay reports he has continued to self-progress his HEP with increasing reps and addition of 3# cuff weights and feels like he is still benefitting from this. Therapeutic activities targeting core strengthening and balance with seated physioball exercises, resisted dynamic stepping activities as well as progressive static standing corner balance activities - pt requiring frequent use of corner walls for LOB correction with foam surfaces and activities where eyes are closed. Discussed safe way to attempt corner balance activities at home using firm/solid surface and chair in front for safety while focusing on narrow BOS and incorporating head and body motions - pt cautioned not to attempt eyes closed or soft/unstable surface w/o PT supervision.    Comorbidities chronic spinal cerebellar ataxia, neuropathy, CAD s/p CABG x 4, Aflutter, osteopenia, B shoulder surgery, carpal tunnel release, OSA    Rehab Potential Good    PT Frequency 2x / week    PT Duration 6 weeks    PT Treatment/Interventions ADLs/Self Care Home Management;DME Instruction;Gait training;Stair training;Functional mobility training;Therapeutic activities;Therapeutic exercise;Balance training;Neuromuscular re-education;Patient/family education;Manual techniques;Passive range of motion;Dry needling    PT Next Visit Plan hip flexibilty & LE strengthening - emphasis on balance and functional movement  pattterns; core/postural strengthening; balance training; gait training with trekking/hiking poles    PT Home Exercise Plan MedBridge Access Codes: [id number] 08/26/22);  [id number] (4/18);  [email or web address] (4/27)    Consulted and Agree with Plan of Care Patient    Family Member Consulted wife - Ginny           Patient will benefit from skilled therapeutic intervention in order to improve the following deficits and impairments:  Abnormal gait,Decreased activity tolerance,Decreased balance,Decreased coordination,Decreased endurance,Decreased knowledge of precautions,Decreased knowledge of use of DME,Decreased mobility,Decreased safety awareness,Decreased strength,Difficulty walking,Impaired perceived functional ability,Impaired flexibility,Improper body mechanics,Postural dysfunction  Visit Diagnosis: Unsteadiness on feet  Ataxic gait  Muscle weakness (generalized)  History of falling     Problem List Patient Active Problem List   Diagnosis Date Noted  . Neuropathy 12/12/2018  . Cerebellar ataxia ([place]) 12/12/2018  . Vitreous membranes and strands 10/20/2018  . Bilateral cold feet 04/07/2017  . Osteopenia of multiple sites 04/07/2017  . Vitamin D deficiency 04/07/2017  . CAD in native artery 03/09/2017  . Old MI (myocardial infarction) 03/09/2017  . Carpal tunnel syndrome, 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  . 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 PVC's (premature ventricular contractions) 10/09/2015  . Chronic dryness of both eyes 09/26/2015  . Hypermetropia 09/26/2015  . Presbyopia 09/26/2015  . Regular astigmatism 09/26/2015  . Hypermetropia of both eyes 09/26/2015  . Senile nuclear sclerosis 09/26/2015  . Stationary peripheral pterygium of right eye 09/26/2015  . Unspecified esotropia 09/26/2015  . Ventral hernia without obstruction or gangrene 07/30/2015  . Dyslipidemia  05/09/2015  . OSA (obstructive sleep apnea) 05/09/2015  . 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  Unspecified atrial flutter ([place]) 05/09/2015    Percival Spanish, PT, MPT 10/02/2020, 11:57 AM  [facility] [address]  [place], [place], [address] Phone: [phone]   Fax:  [phone]  Name: AUSTIN HERD MRN: [id number] Date of Birth: Mar 31, 1941

## 2020-10-07 ENCOUNTER — Other Ambulatory Visit: Payer: Self-pay

## 2020-10-07 ENCOUNTER — Encounter: Payer: Self-pay | Admitting: Physical Therapy

## 2020-10-07 ENCOUNTER — Ambulatory Visit: Payer: Medicare HMO | Admitting: Physical Therapy

## 2020-10-07 DIAGNOSIS — Z9181 History of falling: Secondary | ICD-10-CM

## 2020-10-07 DIAGNOSIS — R2681 Unsteadiness on feet: Secondary | ICD-10-CM | POA: Diagnosis not present

## 2020-10-07 DIAGNOSIS — R260 Ataxic gait: Secondary | ICD-10-CM

## 2020-10-07 DIAGNOSIS — M6281 Muscle weakness (generalized): Secondary | ICD-10-CM

## 2020-10-07 NOTE — Therapy (Signed)
[facility] [address]  [place], [place], [address] Phone: [phone]   Fax:  [phone]  Physical Therapy Treatment  Patient Details  Name: Andre [name] MRN: [id number] Date of Birth: [birth year] Referring Provider (PT): Merilynn Finland, MD   Encounter Date: 10/07/2020   PT End of Session - 10/07/20 1402     Visit Number 20    Number of Visits 27    Date for PT Re-Evaluation 10/31/20    Authorization Type [employer] Medicare    Progress Note Due on Visit 25   Recert on visit #62 (10/27/48)   PT Start Time 1402    PT Stop Time 1444    PT Time Calculation (min) 42 min    Equipment Utilized During Treatment Gait belt    Activity Tolerance Patient tolerated treatment well    Behavior During Therapy WFL for tasks assessed/performed             Past Medical History:  Diagnosis Date   Carpal tunnel syndrome    Cerebellar ataxia ([place])    spinal cerebellar ataxia   Dyslipidemia    H/O rotator cuff surgery    Obstructive sleep apnea     Past Surgical History:  Procedure Laterality Date   BYPASS GRAFT AORTA TO AORTA     quadruple bypass   CARPAL TUNNEL RELEASE Left    EYE SURGERY Bilateral    SHOULDER SURGERY     bilateral   TONSILLECTOMY     VASECTOMY      There were no vitals filed for this visit.   Subjective Assessment - 10/07/20 1408     Subjective No concerns today.    Pertinent History 06/22/20 - R sided pelvic fracture s/p fall    Diagnostic tests 06/25/20 R hip MRI: positive for acute, nondisplaced fractures of the anterior wall and roof of the right acetabulum. There is also likely a nondisplaced fracture through the posterior aspect of the right inferior pubic ramus.  Mild right pectineus strain without tear.   06/26/20 CT pelvis: 1. The only fracture component well seen at CT is the anterior acetabular wall fracture, but based on MRI there is also an inferior   pubic ramus fracture and some  fracture extension into the superior acetabulum on the right. The appearance favors nondisplaced right anterior column fracture.   2. Low-grade edema along the right obturator internus and hip adductor musculature, better seen at MRI.   3. Prostatomegaly.   4. Sigmoid colon diverticulosis.   5. 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   6. Aortic atherosclerosis.    Patient Stated Goals "to improve my balance and strength in my legs"    Currently in Pain? No/denies                               [facility] Adult PT Treatment/Exercise - 10/07/20 1402       Knee/Hip Exercises: Aerobic   Recumbent Bike L5 x 6 min               PWR [facility]) - 10/07/20 1402     PWR! Up 2 x 10    PWR! [employer]! Twist x20   using purple boom whackers   PWR Step x20    PWR! Step Through Forward/Back Alt LE fwd & back steps x 20 each;  R/L step-through fwd/back x 10 each side              Balance Exercises - 10/07/20 1402       Balance Exercises: Standing   Tandem Gait 4 reps;Intermittent upper extremity support;Forward;Retro   10 ft with intermittent single UE support against wall - better control noted with retro vs fwd steps   Sidestepping 4 reps   x 10 ft with back to wall in event of LOB - occasional need for reach back to wall to steady self on 1st 2 passes but able to complete last 2 passes w/o UE support   Other Standing Exercises Alt fwd step to Airex pad with cross-body reach to cone atop 36" FR x 20 - CGA of PT   1 LOB to R requiring PT assist to correct                PT Short Term Goals - 08/28/20 1153       PT SHORT TERM GOAL #1   Title Patient will be independent with initial HEP    Status Achieved   08/12/20   Target Date 08/19/20      PT SHORT TERM GOAL #2   Title Patient will demonstrate safe transfer technique with RW to reduce fall risk    Status Achieved   08/28/20 - Pt  better aware of safe hand placement during transfers and will self-correct if he tries to reach for the RW   Target Date 08/19/20      PT SHORT TERM GOAL #3   Title Patient will demonstrate safe gait pattern >500 ft with RW on all surfaces to reduce risk for falls    Status Achieved   08/28/20   Target Date 08/26/20      PT SHORT TERM GOAL #4   Title Patient will improve Berg score to >/= 38/56 to improve safety stability with ADLs in standing and reduce risk for falls    Status Achieved   08/28/20   Target Date 08/26/20      PT SHORT TERM GOAL #5   Title Patient will safely negotiate stairs with handraill support to allow safe access to bedroom and home egress    Status Achieved   08/28/20   Target Date 08/26/20               PT Long Term Goals - 10/07/20 1440       PT LONG TERM GOAL #1   Title Patient will be independent with ongoing/advanced HEP +/- gym program for self-management at home    Status Partially Met   10/07/20 - met for current HEP   Target Date 10/31/20      PT LONG TERM GOAL #2   Title Patient will demonstrate improved B LE strength to >/= 4+/5 for improved stability and ease of mobility    Status Partially Met   09/11/20 - met except L ankle and isolated motions in B hips   Target Date 10/31/20      PT LONG TERM GOAL #3   Title Patient to demonstrate symmetrical step length, good foot clearance, and good heel-toe pattern with upright posture when ambulating with SPC or walking poles    Status Partially Met   09/19/20: symmetrical step length with good foot clearance and heel-toe pattern but intermittent sway resulting in min/mod instability   Target Date 10/31/20      PT LONG TERM GOAL #4   Title Patient will demonstrate decreased TUG  time to </= 15 sec with LRAD to decrease risk for falls with transitional mobility    Baseline 21.53 sec with RW    Status Achieved   09/19/20: TUG = 15.15 sec with RW; 12.28 sec with single hiking pole     PT LONG TERM GOAL #5    Title Patient will improve Berg score to >/= 45/56 to improve safety stability with ADLs in standing and reduce risk for falls    Baseline 32/56    Status Partially Met   09/19/20: Berg = 42/56   Target Date 10/31/20                   Plan - 10/07/20 1444     Clinical Impression Statement Andre [name] reports he was able to attempt some of the corner balance activities from last visit as well as some of the reaching activities using a corner in his bathroom at home. Progressed dynamic stepping activities with increasing reps for PWR! Moves and adding fwd/back step through to facilitate larger movement patterns for better balance. Incorporated stepping activities into crossbody reaching tasks with uneven/foam surface to further promote dynamic stability with 1 moderate LOB requiring PT assist to prevent fall. Decreased reliance on UE support with tandem and side stepping to promote improved ant/post and lateral stability with only minor LOB observed requiring reach for wall to stabilize. Andre [name] is demonstrating improved overall balance and stability but continues to experience occasional LOB due impact of ataxia on movement patterns.    Comorbidities chronic spinal cerebellar ataxia, neuropathy, CAD s/p CABG x 4, Aflutter, osteopenia, B shoulder surgery, carpal tunnel release, OSA    Rehab Potential Good    PT Frequency 2x / week    PT Duration 6 weeks    PT Treatment/Interventions ADLs/Self Care Home Management;DME Instruction;Gait training;Stair training;Functional mobility training;Therapeutic activities;Therapeutic exercise;Balance training;Neuromuscular re-education;Patient/family education;Manual techniques;Passive range of motion;Dry needling    PT Next Visit Plan hip flexibilty & LE strengthening - emphasis on balance and functional movement pattterns; core/postural strengthening; balance training; gait training with trekking/hiking poles    PT Home Exercise Plan MedBridge Access Codes:  [email or web address] 30-Aug-2022);  [id number] (4/18);  [email or web address] (4/27)    Consulted and Agree with Plan of Care Patient    Family Member Consulted wife - Andre [name]             Patient will benefit from skilled therapeutic intervention in order to improve the following deficits and impairments:  Abnormal gait, Decreased activity tolerance, Decreased balance, Decreased coordination, Decreased endurance, Decreased knowledge of precautions, Decreased knowledge of use of DME, Decreased mobility, Decreased safety awareness, Decreased strength, Difficulty walking, Impaired perceived functional ability, Impaired flexibility, Improper body mechanics, Postural dysfunction  Visit Diagnosis: Unsteadiness on feet  Ataxic gait  Muscle weakness (generalized)  History of falling     Problem List Patient Active Problem List   Diagnosis Date Noted   Neuropathy 12/12/2018   Cerebellar ataxia ([place]) 12/12/2018   Vitreous membranes and strands 10/20/2018   Bilateral cold feet 04/07/2017   Osteopenia of multiple sites 04/07/2017   Vitamin D deficiency 04/07/2017   CAD in native artery 03/09/2017   Old MI (myocardial infarction) 03/09/2017   Carpal tunnel syndrome, 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   Ulnar neuropathy at elbow, 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   PVC's (premature ventricular contractions) 10/09/2015   Chronic dryness of both eyes 09/26/2015   Hypermetropia 09/26/2015   Presbyopia 09/26/2015   Regular astigmatism 09/26/2015   Hypermetropia of both eyes 09/26/2015   Senile nuclear sclerosis  09/26/2015   Stationary peripheral pterygium of right eye 09/26/2015   Unspecified esotropia 09/26/2015   Ventral hernia without obstruction or gangrene 07/30/2015   Dyslipidemia 05/09/2015   OSA (obstructive sleep apnea) 05/09/2015   S/P CABG (coronary artery bypass graft) 05/09/2015   Unspecified atrial flutter ([place]) 05/09/2015    Percival Spanish, PT, MPT 10/07/2020, 3:03 PM  [facility] [address]  [place], [place], [address] Phone: [phone]   Fax:  [phone]  Name: Andre [name] MRN: [id number] Date of Birth: 07/29/1940

## 2020-10-09 ENCOUNTER — Encounter: Payer: Self-pay | Admitting: Physical Therapy

## 2020-10-09 ENCOUNTER — Ambulatory Visit: Payer: Medicare HMO | Admitting: Physical Therapy

## 2020-10-09 ENCOUNTER — Other Ambulatory Visit: Payer: Self-pay

## 2020-10-09 DIAGNOSIS — R2681 Unsteadiness on feet: Secondary | ICD-10-CM | POA: Diagnosis not present

## 2020-10-09 DIAGNOSIS — R260 Ataxic gait: Secondary | ICD-10-CM

## 2020-10-09 DIAGNOSIS — M6281 Muscle weakness (generalized): Secondary | ICD-10-CM

## 2020-10-09 DIAGNOSIS — Z9181 History of falling: Secondary | ICD-10-CM

## 2020-10-09 NOTE — Therapy (Signed)
[facility] [address]  [place], [place], [address] Phone: [phone]   Fax:  [phone]  Physical Therapy Treatment  Patient Details  Name: Andre [name] MRN: [id number] Date of Birth: Feb 25, 1941 Referring Provider (PT): Merilynn Finland, MD   Encounter Date: 10/09/2020   PT End of Session - 10/09/20 1149     Visit Number 21    Number of Visits 27    Date for PT Re-Evaluation 10/31/20    Authorization Type [employer]    Progress Note Due on Visit 25   Recert on visit #95 (0/93/26)   PT Start Time 1149    PT Stop Time 1230    PT Time Calculation (min) 41 min    Equipment Utilized During Treatment Gait belt    Activity Tolerance Patient tolerated treatment well    Behavior During Therapy WFL for tasks assessed/performed             Past Medical History:  Diagnosis Date   Carpal tunnel syndrome    Cerebellar ataxia ([place])    spinal cerebellar ataxia   Dyslipidemia    H/O rotator cuff surgery    Obstructive sleep apnea     Past Surgical History:  Procedure Laterality Date   BYPASS GRAFT AORTA TO AORTA     quadruple bypass   CARPAL TUNNEL RELEASE Left    EYE SURGERY Bilateral    SHOULDER SURGERY     bilateral   TONSILLECTOMY     VASECTOMY      There were no vitals filed for this visit.                      [facility] Adult PT Treatment/Exercise - 10/09/20 1149       Exercises   Exercises Knee/Hip      Knee/Hip Exercises: Aerobic   Recumbent Bike L5 x 6 min      Knee/Hip Exercises: Standing   Lateral Step Up Right;Left;2 sets;10 reps;Step Height: 6";Hand Hold: 2    Lateral Step Up Limitations 2nd set + cross-ver step-up setting opp foot on far side of step    Forward Step Up Right;Left;2 sets;10 reps;Step Height: 6";Hand Hold: 1    Forward Step Up Limitations 2nd set + fwd step-over to floor                 Balance Exercises - 10/09/20 1149       Balance  Exercises: Standing   Balance Beam Fwd/back x 4 & sideways x 6 passes on foam balance beam; intermittent UE support on counter and CGA/SBA of PT    Tandem Gait Forward;Retro;Intermittent upper extremity support;Foam/compliant surface;4 reps   2 x 48f along counter, then 6 passes on foam balance beam; intermittent UE support on counter   Sidestepping Foam/compliant support   2 x 18falong counter, then 6 passes on foam balance beam; intermittent UE support on counter   Other Standing Exercises Alt toe clears to 6" step x 20 and from Airex pad to 8" step x 20; intermittent UE support & CGA/SBA of PT                 PT Short Term Goals - 08/28/20 1153       PT SHORT TERM GOAL #1   Title Patient will be independent with initial HEP    Status Achieved   08/12/20   Target Date 08/19/20      PT  SHORT TERM GOAL #2   Title Patient will demonstrate safe transfer technique with RW to reduce fall risk    Status Achieved   08/28/20 - Pt better aware of safe hand placement during transfers and will self-correct if he tries to reach for the RW   Target Date 08/19/20      PT SHORT TERM GOAL #3   Title Patient will demonstrate safe gait pattern >500 ft with RW on all surfaces to reduce risk for falls    Status Achieved   08/28/20   Target Date 08/26/20      PT SHORT TERM GOAL #4   Title Patient will improve Berg score to >/= 38/56 to improve safety stability with ADLs in standing and reduce risk for falls    Status Achieved   08/28/20   Target Date 08/26/20      PT SHORT TERM GOAL #5   Title Patient will safely negotiate stairs with handraill support to allow safe access to bedroom and home egress    Status Achieved   08/28/20   Target Date 08/26/20               PT Long Term Goals - 10/07/20 1440       PT LONG TERM GOAL #1   Title Patient will be independent with ongoing/advanced HEP +/- gym program for self-management at home    Status Partially Met   10/07/20 - met for current HEP    Target Date 10/31/20      PT LONG TERM GOAL #2   Title Patient will demonstrate improved B LE strength to >/= 4+/5 for improved stability and ease of mobility    Status Partially Met   09/11/20 - met except L ankle and isolated motions in B hips   Target Date 10/31/20      PT LONG TERM GOAL #3   Title Patient to demonstrate symmetrical step length, good foot clearance, and good heel-toe pattern with upright posture when ambulating with SPC or walking poles    Status Partially Met   09/19/20: symmetrical step length with good foot clearance and heel-toe pattern but intermittent sway resulting in min/mod instability   Target Date 10/31/20      PT LONG TERM GOAL #4   Title Patient will demonstrate decreased TUG time to </= 15 sec with LRAD to decrease risk for falls with transitional mobility    Baseline 21.53 sec with RW    Status Achieved   09/19/20: TUG = 15.15 sec with RW; 12.28 sec with single hiking pole     PT LONG TERM GOAL #5   Title Patient will improve Berg score to >/= 45/56 to improve safety stability with ADLs in standing and reduce risk for falls    Baseline 32/56    Status Partially Met   09/19/20: Berg = 42/56   Target Date 10/31/20                   Plan - 10/09/20 1155     Clinical Impression Statement Andre [name] w/o concerns today. Therapeutic activities focusing on functional strengthening and balance with varied step-up/down patterns (fwd, lateral, step-over and cross-over) utilizing intermittent UE support and targeting slow controlled movements - pt able go from hard landing on eccentric step-down to almost inaudible foot fall with decreasing need for UE support and no major LOB. Progressed tandem gait and side stepping to uneven surfaces with introduction of foam balance beam - pt able to wean degree of  UE support with only minor LOB. Andre [name] is progressing well with PT with improving strength, motor control and balance but will continue to benefit from skilled PT to  further improve balance and gait stability to allow for progression to LRAD.    Comorbidities chronic spinal cerebellar ataxia, neuropathy, CAD s/p CABG x 4, Aflutter, osteopenia, B shoulder surgery, carpal tunnel release, OSA    Rehab Potential Good    PT Frequency 2x / week    PT Duration 6 weeks    PT Treatment/Interventions ADLs/Self Care Home Management;DME Instruction;Gait training;Stair training;Functional mobility training;Therapeutic activities;Therapeutic exercise;Balance training;Neuromuscular re-education;Patient/family education;Manual techniques;Passive range of motion;Dry needling    PT Next Visit Plan hip flexibilty & LE strengthening - emphasis on balance and functional movement pattterns; core/postural strengthening; balance training; gait training with trekking/hiking poles    PT Home Exercise Plan MedBridge Access Codes: [id number] 2022/08/14);  [id number] (4/18);  [id number] (4/27)    Consulted and Agree with Plan of Care Patient    Family Member Consulted wife - Andre [name]             Patient will benefit from skilled therapeutic intervention in order to improve the following deficits and impairments:  Abnormal gait, Decreased activity tolerance, Decreased balance, Decreased coordination, Decreased endurance, Decreased knowledge of precautions, Decreased knowledge of use of DME, Decreased mobility, Decreased safety awareness, Decreased strength, Difficulty walking, Impaired perceived functional ability, Impaired flexibility, Improper body mechanics, Postural dysfunction  Visit Diagnosis: Unsteadiness on feet  Ataxic gait  Muscle weakness (generalized)  History of falling     Problem List Patient Active Problem List   Diagnosis Date Noted   Neuropathy 12/12/2018   Cerebellar ataxia ([place]) 12/12/2018   Vitreous membranes and strands 10/20/2018   Bilateral cold feet 04/07/2017   Osteopenia of multiple sites 04/07/2017   Vitamin D deficiency 04/07/2017   CAD in native artery  03/09/2017   Old MI (myocardial infarction) 03/09/2017   Carpal tunnel syndrome, 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   Ulnar neuropathy at elbow, 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   PVC's (premature ventricular contractions) 10/09/2015   Chronic dryness of both eyes 09/26/2015   Hypermetropia 09/26/2015   Presbyopia 09/26/2015   Regular astigmatism 09/26/2015   Hypermetropia of both eyes 09/26/2015   Senile nuclear sclerosis 09/26/2015   Stationary peripheral pterygium of right eye 09/26/2015   Unspecified esotropia 09/26/2015   Ventral hernia without obstruction or gangrene 07/30/2015   Dyslipidemia 05/09/2015   OSA (obstructive sleep apnea) 05/09/2015   S/P CABG (coronary artery bypass graft) 05/09/2015   Unspecified atrial flutter ([place]) 05/09/2015    Percival Spanish, PT, MPT 10/09/2020, 12:53 PM  [facility] [address]  [place], [place], [address] Phone: [phone]   Fax:  [phone]  Name: Andre [name] MRN: [id number] Date of Birth: [birth year]

## 2020-10-14 ENCOUNTER — Encounter: Payer: Self-pay | Admitting: Physical Therapy

## 2020-10-14 ENCOUNTER — Ambulatory Visit: Payer: Medicare HMO | Admitting: Physical Therapy

## 2020-10-14 ENCOUNTER — Other Ambulatory Visit: Payer: Self-pay

## 2020-10-14 DIAGNOSIS — R260 Ataxic gait: Secondary | ICD-10-CM

## 2020-10-14 DIAGNOSIS — R2681 Unsteadiness on feet: Secondary | ICD-10-CM | POA: Diagnosis not present

## 2020-10-14 DIAGNOSIS — Z9181 History of falling: Secondary | ICD-10-CM

## 2020-10-14 DIAGNOSIS — M6281 Muscle weakness (generalized): Secondary | ICD-10-CM

## 2020-10-14 NOTE — Therapy (Signed)
[facility] [address]  [place], [place], [address] Phone: [phone]   Fax:  [phone]  Physical Therapy Treatment  Patient Details  Name: Andre [name] MRN: [id number] Date of Birth: 30-Sep-1940 Referring Provider (PT): Merilynn Finland, MD   Encounter Date: 10/14/2020   PT End of Session - 10/14/20 1401     Visit Number 22    Number of Visits 27    Date for PT Re-Evaluation 10/31/20    Authorization Type [employer] Medicare    Progress Note Due on Visit 25   Recert on visit #84 (1/66/06)   PT Start Time 1401    PT Stop Time 1446    PT Time Calculation (min) 45 min    Equipment Utilized During Treatment Gait belt    Activity Tolerance Patient tolerated treatment well    Behavior During Therapy WFL for tasks assessed/performed             Past Medical History:  Diagnosis Date   Carpal tunnel syndrome    Cerebellar ataxia ([place])    spinal cerebellar ataxia   Dyslipidemia    H/O rotator cuff surgery    Obstructive sleep apnea     Past Surgical History:  Procedure Laterality Date   BYPASS GRAFT AORTA TO AORTA     quadruple bypass   CARPAL TUNNEL RELEASE Left    EYE SURGERY Bilateral    SHOULDER SURGERY     bilateral   TONSILLECTOMY     VASECTOMY      There were no vitals filed for this visit.   Subjective Assessment - 10/14/20 1404     Subjective Pt reports a relaxing weekend. States he has been able to walk his dog with the walking stick, including walking some of the hills in the neighborhood.    Pertinent History 06/22/20 - R sided pelvic fracture s/p fall    Diagnostic tests 06/25/20 R hip MRI: positive for acute, nondisplaced fractures of the anterior wall and roof of the right acetabulum. There is also likely a nondisplaced fracture through the posterior aspect of the right inferior pubic ramus.  Mild right pectineus strain without tear.   06/26/20 CT pelvis: 1. The only fracture component well  seen at CT is the anterior acetabular wall fracture, but based on MRI there is also an inferior   pubic ramus fracture and some fracture extension into the superior acetabulum on the right. The appearance favors nondisplaced right anterior column fracture.   2. Low-grade edema along the right obturator internus and hip adductor musculature, better seen at MRI.   3. Prostatomegaly.   4. Sigmoid colon diverticulosis.   5. 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   6. Aortic atherosclerosis.    Patient Stated Goals "to improve my balance and strength in my legs"                               [facility] Adult PT Treatment/Exercise - 10/14/20 1401       Exercises   Exercises Knee/Hip      Lumbar Exercises: Standing   Row Both;15 reps;Strengthening   2 sets   Theraband Level (Row) Level 3 (Green)    Row Limitations 1 set each normal and staggered stance to recruit core/abdominal muscle activation and challenge dynamic standing balance    Shoulder Extension Both;15 reps;Strengthening  2 sets   Theraband Level (Shoulder Extension) Level 3 (Green)    Shoulder Extension Limitations 1 set each normal and staggered stance to recruit core/abdominal muscle activation and challenge dynamic standing balance    Other Standing Lumbar Exercises R/Lgreen TB pallof press x 10      Knee/Hip Exercises: Aerobic   Recumbent Bike L5 x 6 min      Shoulder Exercises: Seated   Theraband Level (Shoulder Extension) Level 4 (Blue)   provided for HEP   Theraband Level (Shoulder Row) Level 4 (Blue)   provided for HEP                Balance Exercises - 10/14/20 1401       Balance Exercises: Standing   Standing Eyes Opened Narrow base of support (BOS);Foam/compliant surface;Head turns;5 reps   horizontal & vertical head turns, upper trunk rotation   Standing Eyes Closed Narrow base of support (BOS);Foam/compliant  surface;10 secs;3 reps    Wall Bumps Shoulder;Hip;10 reps;Eyes opened   on Airex pad   Partial Tandem Stance Eyes open;Foam/compliant surface;Intermittent upper extremity support;25 secs   + head turns/nods   Sidestepping 2 reps   x 15 ft - no UE support                PT Short Term Goals - 08/28/20 1153       PT SHORT TERM GOAL #1   Title Patient will be independent with initial HEP    Status Achieved   08/12/20   Target Date 08/19/20      PT SHORT TERM GOAL #2   Title Patient will demonstrate safe transfer technique with RW to reduce fall risk    Status Achieved   08/28/20 - Pt better aware of safe hand placement during transfers and will self-correct if he tries to reach for the RW   Target Date 08/19/20      PT SHORT TERM GOAL #3   Title Patient will demonstrate safe gait pattern >500 ft with RW on all surfaces to reduce risk for falls    Status Achieved   08/28/20   Target Date 08/26/20      PT SHORT TERM GOAL #4   Title Patient will improve Berg score to >/= 38/56 to improve safety stability with ADLs in standing and reduce risk for falls    Status Achieved   08/28/20   Target Date 08/26/20      PT SHORT TERM GOAL #5   Title Patient will safely negotiate stairs with handraill support to allow safe access to bedroom and home egress    Status Achieved   08/28/20   Target Date 08/26/20               PT Long Term Goals - 10/07/20 1440       PT LONG TERM GOAL #1   Title Patient will be independent with ongoing/advanced HEP +/- gym program for self-management at home    Status Partially Met   10/07/20 - met for current HEP   Target Date 10/31/20      PT LONG TERM GOAL #2   Title Patient will demonstrate improved B LE strength to >/= 4+/5 for improved stability and ease of mobility    Status Partially Met   09/11/20 - met except L ankle and isolated motions in B hips   Target Date 10/31/20      PT LONG TERM GOAL #3   Title Patient to demonstrate symmetrical step  length, good foot clearance, and good heel-toe pattern with upright posture when ambulating with SPC or walking poles    Status Partially Met   09/19/20: symmetrical step length with good foot clearance and heel-toe pattern but intermittent sway resulting in min/mod instability   Target Date 10/31/20      PT LONG TERM GOAL #4   Title Patient will demonstrate decreased TUG time to </= 15 sec with LRAD to decrease risk for falls with transitional mobility    Baseline 21.53 sec with RW    Status Achieved   09/19/20: TUG = 15.15 sec with RW; 12.28 sec with single hiking pole     PT LONG TERM GOAL #5   Title Patient will improve Berg score to >/= 45/56 to improve safety stability with ADLs in standing and reduce risk for falls    Baseline 32/56    Status Partially Met   09/19/20: Berg = 42/56   Target Date 10/31/20                   Plan - 10/14/20 1408     Clinical Impression Statement Jhonny reports increased ambulation in his neighborhood with decreasing AD support - he has been walking his dog with his walking stick including up/down the hills near his home. Therapeutic activities continue to focus static and dynamic balance with varying BOS and support surfaces and incorporating core/abdominal strengthening to improve stability - PT providing CGA/SBA with gait belt but decreasing need for PT assist for balance correction. Able to progress rows and shoulder extensions to standing with green TB with PT supervision for balance and blue TB provided for seated performance at home.    Comorbidities chronic spinal cerebellar ataxia, neuropathy, CAD s/p CABG x 4, Aflutter, osteopenia, B shoulder surgery, carpal tunnel release, OSA    Rehab Potential Good    PT Frequency 2x / week    PT Duration 6 weeks    PT Treatment/Interventions ADLs/Self Care Home Management;DME Instruction;Gait training;Stair training;Functional mobility training;Therapeutic activities;Therapeutic exercise;Balance  training;Neuromuscular re-education;Patient/family education;Manual techniques;Passive range of motion;Dry needling    PT Next Visit Plan hip flexibilty & LE strengthening - emphasis on balance and functional movement pattterns; core/postural strengthening; balance training; gait training with trekking/hiking poles    PT Home Exercise Plan MedBridge Access Codes: [email or web address] Aug 20, 2022);  [id number] (4/18);  [email or web address] (4/27)    Consulted and Agree with Plan of Care Patient    Family Member Consulted wife - Ginny             Patient will benefit from skilled therapeutic intervention in order to improve the following deficits and impairments:  Abnormal gait, Decreased activity tolerance, Decreased balance, Decreased coordination, Decreased endurance, Decreased knowledge of precautions, Decreased knowledge of use of DME, Decreased mobility, Decreased safety awareness, Decreased strength, Difficulty walking, Impaired perceived functional ability, Impaired flexibility, Improper body mechanics, Postural dysfunction  Visit Diagnosis: Unsteadiness on feet  Ataxic gait  Muscle weakness (generalized)  History of falling     Problem List Patient Active Problem List   Diagnosis Date Noted   Neuropathy 12/12/2018   Cerebellar ataxia ([place]) 12/12/2018   Vitreous membranes and strands 10/20/2018   Bilateral cold feet 04/07/2017   Osteopenia of multiple sites 04/07/2017   Vitamin D deficiency 04/07/2017   CAD in native artery 03/09/2017   Old MI (myocardial infarction) 03/09/2017   Carpal tunnel syndrome, 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   Ulnar neuropathy at elbow, 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   PVC's (premature ventricular contractions) 10/09/2015   Chronic  dryness of both eyes 09/26/2015   Hypermetropia 09/26/2015   Presbyopia 09/26/2015   Regular astigmatism 09/26/2015   Hypermetropia of both eyes 09/26/2015   Senile nuclear sclerosis 09/26/2015   Stationary peripheral pterygium of right eye 09/26/2015   Unspecified  esotropia 09/26/2015   Ventral hernia without obstruction or gangrene 07/30/2015   Dyslipidemia 05/09/2015   OSA (obstructive sleep apnea) 05/09/2015   S/P CABG (coronary artery bypass graft) 05/09/2015   Unspecified atrial flutter ([place]) 05/09/2015    Percival Spanish, PT, MPT 10/14/2020, 3:06 PM  [facility] [address]  [place], [place], [address] Phone: [phone]   Fax:  [phone]  Name: JAYKUB MACKINS MRN: [id number] Date of Birth: [birth year]

## 2020-10-16 ENCOUNTER — Other Ambulatory Visit: Payer: Self-pay

## 2020-10-16 ENCOUNTER — Ambulatory Visit: Payer: Medicare HMO | Admitting: Physical Therapy

## 2020-10-16 DIAGNOSIS — R260 Ataxic gait: Secondary | ICD-10-CM

## 2020-10-16 DIAGNOSIS — M6281 Muscle weakness (generalized): Secondary | ICD-10-CM

## 2020-10-16 DIAGNOSIS — R2681 Unsteadiness on feet: Secondary | ICD-10-CM

## 2020-10-16 DIAGNOSIS — Z9181 History of falling: Secondary | ICD-10-CM

## 2020-10-16 NOTE — Therapy (Signed)
[facility] [address]  [place], [place], [address] Phone: [phone]   Fax:  [phone]  Physical Therapy Treatment  Patient Details  Name: Andre [name] MRN: [id number] Date of Birth: [birth year] Referring Provider (PT): Merilynn Finland, MD   Encounter Date: 10/16/2020   PT End of Session - 10/16/20 1142     Visit Number 23    Number of Visits 27    Date for PT Re-Evaluation 10/31/20    Authorization Type [employer]    Progress Note Due on Visit 25   Recert on visit #22 (4/82/50)   PT Start Time 1142    PT Stop Time 1226    PT Time Calculation (min) 44 min    Equipment Utilized During Treatment Gait belt    Activity Tolerance Patient tolerated treatment well    Behavior During Therapy WFL for tasks assessed/performed             Past Medical History:  Diagnosis Date   Carpal tunnel syndrome    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   Subjective Assessment - 10/16/20 1147     Subjective No new concerns today.    Pertinent History 06/22/20 - R sided pelvic fracture s/p fall    Diagnostic tests 06/25/20 R hip MRI: positive for acute, nondisplaced fractures of the anterior wall and roof of the right acetabulum. There is also likely a nondisplaced fracture through the posterior aspect of the right inferior pubic ramus.  Mild right pectineus strain without tear.   06/26/20 CT pelvis: 1. The only fracture component well seen at CT is the anterior acetabular wall fracture, but based on MRI there is also an inferior   pubic ramus fracture and some  fracture extension into the superior acetabulum on the right. The appearance favors nondisplaced right anterior column fracture.   2. Low-grade edema along the right obturator internus and hip adductor musculature, better seen at MRI.   3. Prostatomegaly.   4. Sigmoid colon diverticulosis.   5. 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   6. Aortic atherosclerosis.    Patient Stated Goals "to improve my balance and strength in my legs"                               [facility] Adult PT Treatment/Exercise - 10/16/20 1142       Knee/Hip Exercises: Aerobic   Recumbent Bike L5 x 6 min      Knee/Hip Exercises: Standing   Hip Flexion Right;Left;10 reps;Stengthening;Knee straight (P)     Hip Flexion Limitations SLS + opp LE SLR with green TB at ankle; B pole A for balance + CGA/SBA of PT (P)     Hip ADduction Right;Left;10 reps;Strengthening (P)     Hip ADduction Limitations SLS + opp LE SLR with green TB at ankle; B pole A for  balance + CGA/SBA of PT (P)     Hip Abduction Right;Left;10 reps;Stengthening;Knee straight (P)     Abduction Limitations SLS + opp LE SLR with green TB at ankle; B pole A for balance + CGA/SBA of PT (P)     Hip Extension Right;Left;10 reps;Stengthening;Knee straight (P)     Extension Limitations SLS + opp LE SLR with green TB at ankle; B pole A for balance + CGA/SBA of PT (P)     SLS with Vectors L/R SLS with 3-way tap to fwd cones 2 x 5 (2nd set with SLS foot on blue foam oval); 2 pole A for balance & CGA of PT (P)                  Balance Exercises - 10/16/20 1142       Balance Exercises: Standing   Step Over Hurdles / Cones Sequential step-over x 4 obstacles (over pool noodle,  FR and balance pebbles) x 8 passes, Step-over pool noodle + 6" step-up and over + Step-over  FR x 8 passes; B walking poles for balance    Other Standing Exercises Stoop & squat to pick up bean bags  from floor with single walking pole x 8 to simulate cleaning up after his dog while walking                 PT Short Term Goals - 08/28/20 1153       PT SHORT TERM GOAL #1   Title Patient will be independent with initial HEP    Status Achieved   08/12/20   Target Date 08/19/20      PT SHORT TERM GOAL #2   Title Patient will demonstrate safe transfer technique with RW to reduce fall risk    Status Achieved   08/28/20 - Pt better aware of safe hand placement during transfers and will self-correct if he tries to reach for the RW   Target Date 08/19/20      PT SHORT TERM GOAL #3   Title Patient will demonstrate safe gait pattern >500 ft with RW on all surfaces to reduce risk for falls    Status Achieved   08/28/20   Target Date 08/26/20      PT SHORT TERM GOAL #4   Title Patient will improve Berg score to >/= 38/56 to improve safety stability with ADLs in standing and reduce risk for falls    Status Achieved   08/28/20   Target Date 08/26/20      PT SHORT TERM GOAL #5   Title Patient will safely negotiate stairs with handraill support to allow safe access to bedroom and home egress    Status Achieved   08/28/20   Target Date 08/26/20               PT Long Term Goals - 10/07/20 1440       PT LONG TERM GOAL #1   Title Patient will be independent with ongoing/advanced HEP +/- gym program for self-management at home    Status Partially Met   10/07/20 - met for current HEP   Target Date 10/31/20      PT LONG TERM GOAL #2   Title Patient will demonstrate improved B LE strength to >/= 4+/5 for improved stability and ease of mobility    Status Partially Met   09/11/20 - met except L ankle and isolated motions in B hips   Target Date 10/31/20      PT  LONG TERM GOAL #3   Title Patient to demonstrate symmetrical step length, good foot clearance, and good heel-toe pattern with upright posture when ambulating with SPC or walking poles    Status Partially Met   09/19/20: symmetrical  step length with good foot clearance and heel-toe pattern but intermittent sway resulting in min/mod instability   Target Date 10/31/20      PT LONG TERM GOAL #4   Title Patient will demonstrate decreased TUG time to </= 15 sec with LRAD to decrease risk for falls with transitional mobility    Baseline 21.53 sec with RW    Status Achieved   09/19/20: TUG = 15.15 sec with RW; 12.28 sec with single hiking pole     PT LONG TERM GOAL #5   Title Patient will improve Berg score to >/= 45/56 to improve safety stability with ADLs in standing and reduce risk for falls    Baseline 32/56    Status Partially Met   09/19/20: Berg = 42/56   Target Date 10/31/20                   Plan - 10/16/20 1226     Clinical Impression Statement Andre [name] states he feels that his balance is improving, and he feels more comfortable walking longer distances including inclines and more uneven surfaces. Continued to promote proximal stability along with ankle strategies with SLS strengthening and balance activities. Simulated outdoor activities with stepping over obstacles and stopping/squatting with walking pole with CGA of PT such as would be needed to clean up after his dog while walking - occasional LOB noted but pt able to self-correct w/o PT assistance. Pt progressing well with PT and anticipate he will be ready to transition to his HEP by the end of the current PT episode.    Comorbidities chronic spinal cerebellar ataxia, neuropathy, CAD s/p CABG x 4, Aflutter, osteopenia, B shoulder surgery, carpal tunnel release, OSA    Rehab Potential Good    PT Frequency 2x / week    PT Duration 6 weeks    PT Treatment/Interventions ADLs/Self Care Home Management;DME Instruction;Gait training;Stair training;Functional mobility training;Therapeutic activities;Therapeutic exercise;Balance training;Neuromuscular re-education;Patient/family education;Manual techniques;Passive range of motion;Dry needling    PT Next Visit Plan  hip flexibilty & LE strengthening - emphasis on balance and functional movement pattterns; core/postural strengthening; balance training; gait training with trekking/hiking poles    PT Home Exercise Plan MedBridge Access Codes: [id number] 19-Aug-2022);  [id number] (4/18);  [id number] (4/27)    Consulted and Agree with Plan of Care Patient    Family Member Consulted wife - Ginny             Patient will benefit from skilled therapeutic intervention in order to improve the following deficits and impairments:  Abnormal gait, Decreased activity tolerance, Decreased balance, Decreased coordination, Decreased endurance, Decreased knowledge of precautions, Decreased knowledge of use of DME, Decreased mobility, Decreased safety awareness, Decreased strength, Difficulty walking, Impaired perceived functional ability, Impaired flexibility, Improper body mechanics, Postural dysfunction  Visit Diagnosis: Unsteadiness on feet  Ataxic gait  Muscle weakness (generalized)  History of falling     Problem List Patient Active Problem List   Diagnosis Date Noted   Neuropathy 12/12/2018   Cerebellar ataxia ([place]) 12/12/2018   Vitreous membranes and strands 10/20/2018   Bilateral cold feet 04/07/2017   Osteopenia of multiple sites 04/07/2017   Vitamin D deficiency 04/07/2017   CAD in native artery 03/09/2017   Old MI (myocardial infarction) 03/09/2017  Carpal tunnel syndrome, 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   Ulnar neuropathy at elbow, 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   PVC's (premature ventricular contractions) 10/09/2015   Chronic dryness of both eyes 09/26/2015   Hypermetropia 09/26/2015   Presbyopia 09/26/2015   Regular astigmatism 09/26/2015   Hypermetropia of both eyes 09/26/2015   Senile nuclear sclerosis 09/26/2015   Stationary peripheral pterygium of right eye 09/26/2015   Unspecified esotropia 09/26/2015   Ventral hernia without obstruction or gangrene 07/30/2015   Dyslipidemia 05/09/2015   OSA (obstructive sleep apnea)  05/09/2015   S/P CABG (coronary artery bypass graft) 05/09/2015   Unspecified atrial flutter ([place]) 05/09/2015    Percival Spanish, PT, MPT 10/16/2020, 1:56 PM  [facility] [address]  [place], [place], [address] Phone: [phone]   Fax:  [phone]  Name: Andre [name] MRN: [id number] Date of Birth: [birth year]

## 2020-10-21 ENCOUNTER — Ambulatory Visit: Payer: Medicare HMO | Admitting: Physical Therapy

## 2020-10-21 ENCOUNTER — Other Ambulatory Visit: Payer: Self-pay

## 2020-10-21 ENCOUNTER — Encounter: Payer: Self-pay | Admitting: Physical Therapy

## 2020-10-21 DIAGNOSIS — R2681 Unsteadiness on feet: Secondary | ICD-10-CM | POA: Diagnosis not present

## 2020-10-21 DIAGNOSIS — R260 Ataxic gait: Secondary | ICD-10-CM

## 2020-10-21 DIAGNOSIS — M6281 Muscle weakness (generalized): Secondary | ICD-10-CM

## 2020-10-21 DIAGNOSIS — Z9181 History of falling: Secondary | ICD-10-CM

## 2020-10-21 NOTE — Therapy (Signed)
[facility] [address]  [place], [place], [address] Phone: [phone]   Fax:  [phone]  Physical Therapy Treatment  Patient Details  Name: Andre [name] MRN: [id number] Date of Birth: 21-Mar-1941 Referring Provider (PT): Merilynn Finland, MD   Encounter Date: 10/21/2020   PT End of Session - 10/21/20 1402     Visit Number 24    Number of Visits 27    Date for PT Re-Evaluation 10/31/20    Authorization Type [employer] Medicare    Progress Note Due on Visit 25   Recert on visit #66 (5/99/35)   PT Start Time 1402    PT Stop Time 1446    PT Time Calculation (min) 44 min    Activity Tolerance Patient tolerated treatment well    Behavior During Therapy [facility] for tasks assessed/performed             Past Medical History:  Diagnosis Date   Carpal tunnel syndrome    Cerebellar ataxia ([place])    spinal cerebellar ataxia   Dyslipidemia    H/O rotator cuff surgery    Obstructive sleep apnea     Past Surgical History:  Procedure Laterality Date   BYPASS GRAFT AORTA TO AORTA     quadruple bypass   CARPAL TUNNEL RELEASE Left    EYE SURGERY Bilateral    SHOULDER SURGERY     bilateral   TONSILLECTOMY     VASECTOMY      There were no vitals filed for this visit.   Subjective Assessment - 10/21/20 1408     Subjective Pt reports he was able to go on an outing to [place] vine[place] with his wife and some friends over the weekend - used his walker but did well walking around.    Pertinent History 06/22/20 - R sided pelvic fracture s/p fall    Diagnostic tests 06/25/20 R hip MRI: positive for acute, nondisplaced fractures of the anterior wall and roof of the right acetabulum. There is also likely a nondisplaced fracture through the posterior aspect of the right inferior pubic ramus.  Mild right pectineus strain without tear.   06/26/20 CT pelvis: 1. The only fracture component well seen at CT is the anterior acetabular wall  fracture, but based on MRI there is also an inferior   pubic ramus fracture and some fracture extension into the superior acetabulum on the right. The appearance favors nondisplaced right anterior column fracture.   2. Low-grade edema along the right obturator internus and hip adductor musculature, better seen at MRI.   3. Prostatomegaly.   4. Sigmoid colon diverticulosis.   5. 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   6. Aortic atherosclerosis.    Patient Stated Goals "to improve my balance and strength in my legs"    Currently in Pain? No/denies                               [facility] Adult PT Treatment/Exercise - 10/21/20 1402       Exercises   Exercises Knee/Hip      Lumbar Exercises: Seated   Other Seated Lumbar Exercises R/L green TB pallof press + short arc rotation  x 15; + scap retraction; seated on dynadisc with feet on Airex pad for increased balance/core activation      Lumbar Exercises: Supine   Dead Bug 10  reps    Dead Bug Limitations with looped green TB at feet    Bridge 10 reps    Bridge Limitations + alt knee extension    Bridge with clamshell 10 reps    Bridge with [employer] Limitations blue TB at [employer] with March 10 reps      Knee/Hip Exercises: Aerobic   Recumbent Bike L5 x 6 min      Knee/Hip Exercises: Standing   Forward Lunges Right;Left;10 reps;3 seconds      Shoulder Exercises: Seated   Extension Both;15 reps;Strengthening;Theraband    Theraband Level (Shoulder Extension) Level 3 (Green)    Extension Limitations + scap retraction; seated on dynadisc with feet on Airex pad for increased balance/core activation    Row Both;15 reps;Strengthening;Theraband    Theraband Level (Shoulder Row) Level 3 (Green)    Row Limitations + scap retraction; seated on dynadisc with feet on Airex pad for increased balance/core activation                       PT Short Term Goals - 08/28/20 1153       PT SHORT TERM GOAL #1   Title Patient will be independent with initial HEP    Status Achieved   08/12/20   Target Date 08/19/20      PT SHORT TERM GOAL #2   Title Patient will demonstrate safe transfer technique with RW to reduce fall risk    Status Achieved   08/28/20 - Pt better aware of safe hand placement during transfers and will self-correct if he tries to reach for the RW   Target Date 08/19/20      PT SHORT TERM GOAL #3   Title Patient will demonstrate safe gait pattern >500 ft with RW on all surfaces to reduce risk for falls    Status Achieved   08/28/20   Target Date 08/26/20      PT SHORT TERM GOAL #4   Title Patient will improve Berg score to >/= 38/56 to improve safety stability with ADLs in standing and reduce risk for falls    Status Achieved   08/28/20   Target Date 08/26/20      PT SHORT TERM GOAL #5   Title Patient will safely negotiate stairs with handraill support to allow safe access to bedroom and home egress    Status Achieved   08/28/20   Target Date 08/26/20               PT Long Term Goals - 10/07/20 1440       PT LONG TERM GOAL #1   Title Patient will be independent with ongoing/advanced HEP +/- gym program for self-management at home    Status Partially Met   10/07/20 - met for current HEP   Target Date 10/31/20      PT LONG TERM GOAL #2   Title Patient will demonstrate improved B LE strength to >/= 4+/5 for improved stability and ease of mobility    Status Partially Met   09/11/20 - met except L ankle and isolated motions in B hips   Target Date 10/31/20      PT LONG TERM GOAL #3   Title Patient to demonstrate symmetrical step length, good foot clearance, and good heel-toe pattern with upright posture when ambulating with SPC or walking poles    Status Partially Met   09/19/20: symmetrical step length with good foot clearance and heel-toe pattern  but intermittent sway resulting in  min/mod instability   Target Date 10/31/20      PT LONG TERM GOAL #4   Title Patient will demonstrate decreased TUG time to </= 15 sec with LRAD to decrease risk for falls with transitional mobility    Baseline 21.53 sec with RW    Status Achieved   09/19/20: TUG = 15.15 sec with RW; 12.28 sec with single hiking pole     PT LONG TERM GOAL #5   Title Patient will improve Berg score to >/= 45/56 to improve safety stability with ADLs in standing and reduce risk for falls    Baseline 32/56    Status Partially Met   09/19/20: Berg = 42/56   Target Date 10/31/20                   Plan - 10/21/20 1411     Clinical Impression Statement Jozsef is nearing the end of his current cert period and feels that he will be ready to transition to his HEP after next week, therefore therapy session focusing on review and instruction in continued complexity and/or resistance progression for further strengthening and stability/balance with daily activities at home. Will plan to complete HEP review/update next visit and initiate final balance and goal assessment.    Comorbidities chronic spinal cerebellar ataxia, neuropathy, CAD s/p CABG x 4, Aflutter, osteopenia, B shoulder surgery, carpal tunnel release, OSA    Rehab Potential Good    PT Frequency 2x / week    PT Duration 6 weeks    PT Treatment/Interventions ADLs/Self Care Home Management;DME Instruction;Gait training;Stair training;Functional mobility training;Therapeutic activities;Therapeutic exercise;Balance training;Neuromuscular re-education;Patient/family education;Manual techniques;Passive range of motion;Dry needling    PT Next Visit Plan HEP review/update; hip flexibilty & LE strengthening - emphasis on balance and functional movement pattterns; core/postural strengthening; balance training; gait training with trekking/hiking poles    PT Home Exercise Plan MedBridge Access Codes: [email or web address] 08-08-2022);  [id number] (4/18);  [id number] (4/27)    Consulted  and Agree with Plan of Care Patient    Family Member Consulted --             Patient will benefit from skilled therapeutic intervention in order to improve the following deficits and impairments:  Abnormal gait, Decreased activity tolerance, Decreased balance, Decreased coordination, Decreased endurance, Decreased knowledge of precautions, Decreased knowledge of use of DME, Decreased mobility, Decreased safety awareness, Decreased strength, Difficulty walking, Impaired perceived functional ability, Impaired flexibility, Improper body mechanics, Postural dysfunction  Visit Diagnosis: Unsteadiness on feet  Ataxic gait  Muscle weakness (generalized)  History of falling     Problem List Patient Active Problem List   Diagnosis Date Noted   Neuropathy 12/12/2018   Cerebellar ataxia ([place]) 12/12/2018   Vitreous membranes and strands 10/20/2018   Bilateral cold feet 04/07/2017   Osteopenia of multiple sites 04/07/2017   Vitamin D deficiency 04/07/2017   CAD in native artery 03/09/2017   Old MI (myocardial infarction) 03/09/2017   Carpal tunnel syndrome, 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   Ulnar neuropathy at elbow, 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   PVC's (premature ventricular contractions) 10/09/2015   Chronic dryness of both eyes 09/26/2015   Hypermetropia 09/26/2015   Presbyopia 09/26/2015   Regular astigmatism 09/26/2015   Hypermetropia of both eyes 09/26/2015   Senile nuclear sclerosis 09/26/2015   Stationary peripheral pterygium of right eye 09/26/2015   Unspecified esotropia 09/26/2015   Ventral hernia without obstruction or gangrene 07/30/2015   Dyslipidemia 05/09/2015   OSA (obstructive sleep apnea) 05/09/2015  S/P CABG (coronary artery bypass graft) 05/09/2015   Unspecified atrial flutter ([place]) 05/09/2015    Percival Spanish, PT, MPT 10/21/2020, 4:24 PM  [facility] [address]  [place], [place], [address] Phone: [phone]    Fax:  [phone]  Name: Andre [name] MRN: [id number] Date of Birth: 1940-11-25

## 2020-10-23 ENCOUNTER — Ambulatory Visit: Payer: Medicare HMO | Admitting: Physical Therapy

## 2020-10-23 ENCOUNTER — Other Ambulatory Visit: Payer: Self-pay

## 2020-10-23 ENCOUNTER — Encounter: Payer: Self-pay | Admitting: Physical Therapy

## 2020-10-23 DIAGNOSIS — R2681 Unsteadiness on feet: Secondary | ICD-10-CM

## 2020-10-23 DIAGNOSIS — M6281 Muscle weakness (generalized): Secondary | ICD-10-CM

## 2020-10-23 DIAGNOSIS — Z9181 History of falling: Secondary | ICD-10-CM

## 2020-10-23 DIAGNOSIS — R260 Ataxic gait: Secondary | ICD-10-CM

## 2020-10-23 NOTE — Patient Instructions (Signed)
      Access Code: [facility] URL: [email or web address] Date: 10/23/2020 Prepared by: Glenetta Hew  Exercises Standard Lunge - 1 x daily - 7 x weekly - 2 sets - 10 reps - 3 sec hold Walking Forward Lunge - 1 x daily - 7 x weekly - 3 sec hold Standing Bilateral Low Shoulder Row with Anchored Resistance - 1 x daily - 7 x weekly - 2 sets - 10 reps - 5 sec hold Scapular Retraction with Resistance Advanced - 1 x daily - 7 x weekly - 2 sets - 10 reps - 5 sec hold Standing Balance in Corner - 1 x daily - 7 x weekly - 3 reps - 15-30 sec hold Standing Narrow Base of Support - 1 x daily - 7 x weekly - 3 reps - 15-30 sec hold Tandem Stance - 1 x daily - 7 x weekly - 3 reps - 15-30 sec hold

## 2020-10-23 NOTE — Therapy (Signed)
[facility] [address]  [address], [place], [address] Phone: [phone]   Fax:  [phone]  Physical Therapy Treatment / Progress Note  Patient Details  Name: Andre [name] MRN: [id number] Date of Birth: 02/02/1941 Referring Provider (PT): Merilynn Finland, MD  Progress Note  Reporting Period 09/19/2020 to 10/23/2020  See note below for Objective Data and Assessment of Progress/Goals.     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   Subjective Assessment - 10/23/20 1151     Subjective Pt doing well today.    Pertinent History 06/22/20 - R sided pelvic fracture s/p fall    Diagnostic tests 06/25/20 R hip MRI: positive for acute, nondisplaced fractures of the anterior wall and roof of the right acetabulum. There is also likely a nondisplaced fracture through the posterior aspect of the right inferior pubic ramus.  Mild right pectineus strain without tear.   06/26/20 CT pelvis: 1. The only fracture component well seen at CT is the anterior acetabular wall  fracture, but based on MRI there is also an inferior   pubic ramus fracture and some fracture extension into the superior acetabulum on the right. The appearance favors nondisplaced right anterior column fracture.   2. Low-grade edema along the right obturator internus and hip adductor musculature, better seen at MRI.   3. Prostatomegaly.   4. Sigmoid colon diverticulosis.   5. 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   6. Aortic atherosclerosis.    Patient Stated Goals "to improve my balance and strength in my legs"    Currently in Pain? No/denies                               [facility] Adult PT Treatment/Exercise - 10/23/20 1149       Knee/Hip Exercises: Aerobic   Recumbent Bike L5 x 6 min      Knee/Hip Exercises: Standing   Forward Lunges Right;Left;2 sets;10 reps;3 seconds    Lunge Walking - Round Trips x 6 passes along edge of counter                 Balance Exercises - 10/23/20 1149  Balance Exercises: Standing   Balance Beam Fwd/back & sideways x 6 passes on foam balance beam; intermittent UE support on counter and CGA/SBA of PT               PT Education - 10/23/20 1232     Education Details Final HEP review & update - Access Code: [facility]    Person(s) Educated Patient    Methods Explanation;Demonstration;Verbal cues;Handout    Comprehension Verbalized understanding;Verbal cues required;Returned demonstration              PT Short Term Goals - 08/28/20 1153       PT SHORT TERM GOAL #1   Title Patient will be independent with initial HEP    Status Achieved   08/12/20   Target Date 08/19/20      PT SHORT TERM GOAL #2   Title Patient will demonstrate safe transfer technique with RW to reduce fall risk    Status Achieved   08/28/20 - Pt better aware of safe hand placement during transfers and will self-correct if he tries to reach for the RW   Target Date  08/19/20      PT SHORT TERM GOAL #3   Title Patient will demonstrate safe gait pattern >500 ft with RW on all surfaces to reduce risk for falls    Status Achieved   08/28/20   Target Date 08/26/20      PT SHORT TERM GOAL #4   Title Patient will improve Berg score to >/= 38/56 to improve safety stability with ADLs in standing and reduce risk for falls    Status Achieved   08/28/20   Target Date 08/26/20      PT SHORT TERM GOAL #5   Title Patient will safely negotiate stairs with handraill support to allow safe access to bedroom and home egress    Status Achieved   08/28/20   Target Date 08/26/20               PT Long Term Goals - 10/23/20 1230       PT LONG TERM GOAL #1   Title Patient will be independent with ongoing/advanced HEP +/- gym program for self-management at home    Status Achieved   10/23/20   Target Date 10/31/20      PT LONG TERM GOAL #2   Title Patient will demonstrate improved B LE strength to >/= 4+/5 for improved stability and ease of mobility    Status Partially Met   09/11/20: met except L ankle and isolated motions in B hips   Target Date 10/31/20      PT LONG TERM GOAL #3   Title Patient to demonstrate symmetrical step length, good foot clearance, and good heel-toe pattern with upright posture when ambulating with SPC or walking poles    Status Partially Met   09/19/20: symmetrical step length with good foot clearance and heel-toe pattern but intermittent sway resulting in min/mod instability   Target Date 10/31/20      PT LONG TERM GOAL #4   Title Patient will demonstrate decreased TUG time to </= 15 sec with LRAD to decrease risk for falls with transitional mobility    Baseline 21.53 sec with RW    Status Achieved   09/19/20: TUG = 15.15 sec with RW; 12.28 sec with single hiking pole   Target Date 09/23/20      PT LONG TERM GOAL #5   Title Patient will improve Berg score to >/= 45/56 to  improve safety stability with ADLs in standing and reduce risk for  falls    Baseline 32/56    Status Partially Met   09/19/20: Berg = 42/56   Target Date 10/31/20                   Plan - 10/23/20 1234     Clinical Impression Statement Andre [name] has demonstrated excellent progress with PT with improved strength, balance and gait stability. He has two visits (including today) remaining before his recert date and I anticipate he will be ready to transition to his HEP at the end of the current certification period. We discussed his current HEP and he denied need for formal review of prior HEP handouts, but we did review some of the exercises and activities that we have worked on in PT and he has incorporated into his home program. Provided formal HEP instructions for progression of rows/scap retraction to standing from sitting and added lunges and walking lunges to HEP. Reviewed progression of corner balance activities, going over progression of difficulty with narrowing BOS and adding balance challenges with upper body motions while reinforcing safety with use of chair in front in the event of LOB. Andre [name] reports he feels comfortable with all of the exercises he is currently performing as well as the exercises/activities reviewed today - LTG #1 now met. All of his LTG goals are currently met or partially met and will plan for final balance and goal assessment next visit with anticipated transition to his HEP +/- 30-day hold.    Comorbidities chronic spinal cerebellar ataxia, neuropathy, CAD s/p CABG x 4, Aflutter, osteopenia, B shoulder surgery, carpal tunnel release, OSA    Rehab Potential Good    PT Frequency 2x / week    PT Duration 6 weeks    PT Treatment/Interventions ADLs/Self Care Home Management;DME Instruction;Gait training;Stair training;Functional mobility training;Therapeutic activities;Therapeutic exercise;Balance training;Neuromuscular re-education;Patient/family education;Manual techniques;Passive range of motion;Dry needling    PT Next Visit Plan  final balance and goal assessment next visit with anticipated transition to his HEP +/- 30-day hold    PT Home Exercise Plan MedBridge Access Codes: [email or web address] 08-17-22);  [id number] (4/18);  [email or web address] (4/27);  [place] (6/29)    Consulted and Agree with Plan of Care Patient             Patient will benefit from skilled therapeutic intervention in order to improve the following deficits and impairments:  Abnormal gait, Decreased activity tolerance, Decreased balance, Decreased coordination, Decreased endurance, Decreased knowledge of precautions, Decreased knowledge of use of DME, Decreased mobility, Decreased safety awareness, Decreased strength, Difficulty walking, Impaired perceived functional ability, Impaired flexibility, Improper body mechanics, Postural dysfunction  Visit Diagnosis: Unsteadiness on feet  Ataxic gait  Muscle weakness (generalized)  History of falling     Problem List Patient Active Problem List   Diagnosis Date Noted   Neuropathy 12/12/2018   Cerebellar ataxia ([place]) 12/12/2018   Vitreous membranes and strands 10/20/2018   Bilateral cold feet 04/07/2017   Osteopenia of multiple sites 04/07/2017   Vitamin D deficiency 04/07/2017   CAD in native artery 03/09/2017   Old MI (myocardial infarction) 03/09/2017   Carpal tunnel syndrome, 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   Ulnar neuropathy at elbow, 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   PVC's (premature ventricular contractions) 10/09/2015   Chronic dryness of both eyes 09/26/2015   Hypermetropia 09/26/2015   Presbyopia 09/26/2015   Regular astigmatism 09/26/2015   Hypermetropia of both eyes 09/26/2015   Senile nuclear sclerosis 09/26/2015   Stationary peripheral  pterygium of right eye 09/26/2015   Unspecified esotropia 09/26/2015   Ventral hernia without obstruction or gangrene 07/30/2015   Dyslipidemia 05/09/2015   OSA (obstructive sleep apnea) 05/09/2015   S/P CABG (coronary artery bypass graft) 05/09/2015   Unspecified atrial flutter ([place])  05/09/2015    Percival Spanish, PT, MPT 10/23/2020, 12:59 PM  [facility] [address]  [place], [place], [address] Phone: [phone]   Fax:  [phone]  Name: Andre [name] MRN: [id number] Date of Birth: 02-21-1941

## 2020-10-29 ENCOUNTER — Ambulatory Visit: Payer: Medicare HMO | Attending: Internal Medicine | Admitting: Physical Therapy

## 2020-10-29 ENCOUNTER — Other Ambulatory Visit: Payer: Self-pay

## 2020-10-29 DIAGNOSIS — R260 Ataxic gait: Secondary | ICD-10-CM | POA: Diagnosis present

## 2020-10-29 DIAGNOSIS — Z9181 History of falling: Secondary | ICD-10-CM | POA: Insufficient documentation

## 2020-10-29 DIAGNOSIS — R2681 Unsteadiness on feet: Secondary | ICD-10-CM | POA: Insufficient documentation

## 2020-10-29 DIAGNOSIS — M6281 Muscle weakness (generalized): Secondary | ICD-10-CM | POA: Diagnosis present

## 2020-10-29 NOTE — Therapy (Addendum)
[facility] [address]  [place], [place], [address] Phone: [phone]   Fax:  [phone]  Physical Therapy Treatment / Progress Note / Discharge Summary  Patient Details  Name: Andre [name] MRN: [id number] Date of Birth: 1940/07/16 Referring Provider (PT): Merilynn Finland, MD  Progress Note  Reporting Period 07/29/2020 to 10/29/2020  See note below for Objective Data and Assessment of Progress/Goals.     Encounter Date: 10/29/2020   PT End of Session - 10/29/20 1401     Visit Number 26    Number of Visits 27    Date for PT Re-Evaluation 10/31/20    Authorization Type Aetna Medicare    PT Start Time 1401    PT Stop Time 1445    PT Time Calculation (min) 44 min    Equipment Utilized During Treatment Gait belt    Activity Tolerance Patient tolerated treatment well    Behavior During Therapy WFL for tasks assessed/performed             Past Medical History:  Diagnosis Date   Carpal tunnel syndrome    Cerebellar ataxia ([place])    spinal cerebellar ataxia   Dyslipidemia    H/O rotator cuff surgery    Obstructive sleep apnea     Past Surgical History:  Procedure Laterality Date   BYPASS GRAFT AORTA TO AORTA     quadruple bypass   CARPAL TUNNEL RELEASE Left    EYE SURGERY Bilateral    SHOULDER SURGERY     bilateral   TONSILLECTOMY     VASECTOMY      There were no vitals filed for this visit.   Subjective Assessment - 10/29/20 1404     Subjective Pt ready to proceed with transition to HEP as planned today.    Pertinent History 06/22/20 - R sided pelvic fracture s/p fall    Diagnostic tests 06/25/20 R hip MRI: positive for acute, nondisplaced fractures of the anterior wall and roof of the right acetabulum. There is also likely a nondisplaced fracture through the posterior aspect of the right inferior pubic ramus.  Mild right pectineus strain without tear.   06/26/20 CT pelvis: 1. The only fracture  component well seen at CT is the anterior acetabular wall fracture, but based on MRI there is also an inferior   pubic ramus fracture and some fracture extension into the superior acetabulum on the right. The appearance favors nondisplaced right anterior column fracture.   2. Low-grade edema along the right obturator internus and hip adductor musculature, better seen at MRI.   3. Prostatomegaly.   4. Sigmoid colon diverticulosis.   5. 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   6. Aortic atherosclerosis.    Patient Stated Goals "to improve my balance and strength in my legs"    Currently in Pain? No/denies                [facility] PT Assessment - 10/29/20 1401       Assessment   Medical Diagnosis Unsteadiness on feet s/p fall with R-sided pelvic fracture    Referring Provider (PT) Merilynn Finland, MD    Onset Date/Surgical Date 06/22/20    Next MD Visit 11/06/20 - Dr. Jeralene Huff      Strength   Right Hip Flexion 5/5    Right Hip Extension 5/5    Right Hip External Rotation  5/5    Right Hip  Internal Rotation 5/5    Right Hip ABduction 5/5    Right Hip ADduction 5/5    Left Hip Flexion 5/5    Left Hip Extension 4+/5    Left Hip External Rotation 5/5    Left Hip Internal Rotation 5/5    Left Hip ABduction 5/5    Left Hip ADduction 5/5    Right Knee Flexion 5/5    Right Knee Extension 5/5    Left Knee Flexion 5/5    Left Knee Extension 5/5    Right Ankle Dorsiflexion 4+/5    Right Ankle Plantar Flexion 4+/5   14 SLS heel raises   Left Ankle Dorsiflexion 4+/5    Left Ankle Plantar Flexion 3/5   max resistance with gravity eliminated but unable to initiate L SLS heel raise     Ambulation/Gait   Gait velocity 3.28 ft/sec with single hiking pole; 3.32 ft/sec with RW      Standardized Balance Assessment   10 Meter Walk 10.00 sec with single hiking pole; 9.87 sec with RW      Berg Balance Test   Sit to Stand Able  to stand without using hands and stabilize independently    Standing Unsupported Able to stand safely 2 minutes    Sitting with Back Unsupported but Feet Supported on Floor or Stool Able to sit safely and securely 2 minutes    Stand to Sit Sits safely with minimal use of hands    Transfers Able to transfer safely, minor use of hands    Standing Unsupported with Eyes Closed Able to stand 10 seconds with supervision    Standing Unsupported with Feet Together Able to place feet together independently and stand for 1 minute with supervision    From Standing, Reach Forward with Outstretched Arm Can reach confidently >25 cm (10")    From Standing Position, Pick up Object from Floor Able to pick up shoe safely and easily    From Standing Position, Turn to Look Behind Over each Shoulder Looks behind one side only/other side shows less weight shift    Turn 360 Degrees Needs close supervision or verbal cueing    Standing Unsupported, Alternately Place Feet on Step/Stool Able to stand independently and complete 8 steps >20 seconds    Standing Unsupported, One Foot in Front Able to plae foot ahead of the other independently and hold 30 seconds    Standing on One Leg Able to lift leg independently and hold equal to or more than 3 seconds    Total Score 46    Berg comment: 46-51 = Moderate (>50%) fall risk      Timed Up and Go Test   Normal TUG (seconds) 9.46   with RW; 9.25 sec with single hiking pole                          [facility] Adult PT Treatment/Exercise - 10/29/20 1401       Exercises   Exercises Knee/Hip      Knee/Hip Exercises: Aerobic   Recumbent Bike L5 x 6 min                      PT Short Term Goals - 08/28/20 1153       PT SHORT TERM GOAL #1   Title Patient will be independent with initial HEP    Status Achieved   08/12/20   Target Date 08/19/20  PT SHORT TERM GOAL #2   Title Patient will demonstrate safe transfer technique with RW to reduce  fall risk    Status Achieved   08/28/20 - Pt better aware of safe hand placement during transfers and will self-correct if he tries to reach for the RW   Target Date 08/19/20      PT SHORT TERM GOAL #3   Title Patient will demonstrate safe gait pattern >500 ft with RW on all surfaces to reduce risk for falls    Status Achieved   08/28/20   Target Date 08/26/20      PT SHORT TERM GOAL #4   Title Patient will improve Berg score to >/= 38/56 to improve safety stability with ADLs in standing and reduce risk for falls    Status Achieved   08/28/20   Target Date 08/26/20      PT SHORT TERM GOAL #5   Title Patient will safely negotiate stairs with handraill support to allow safe access to bedroom and home egress    Status Achieved   08/28/20   Target Date 08/26/20               PT Long Term Goals - 10/29/20 1405       PT LONG TERM GOAL #1   Title Patient will be independent with ongoing/advanced HEP +/- gym program for self-management at home    Status Achieved   10/23/20     PT LONG TERM GOAL #2   Title Patient will demonstrate improved B LE strength to >/= 4+/5 for improved stability and ease of mobility    Status Achieved   10/29/20 - except L PF strength     PT LONG TERM GOAL #3   Title Patient to demonstrate symmetrical step length, good foot clearance, and good heel-toe pattern with upright posture when ambulating with SPC or walking poles    Status Achieved   10/29/20 - symmetrical step length with good foot clearance and heel-toe pattern with B and single hiking pole(s), slight sway due to ataxia but no significant LOB     PT LONG TERM GOAL #4   Title Patient will demonstrate decreased TUG time to </= 15 sec with LRAD to decrease risk for falls with transitional mobility    Baseline 21.53 sec with RW    Status Achieved   09/19/20: TUG = 15.15 sec with RW; 12.28 sec with single hiking pole     PT LONG TERM GOAL #5   Title Patient will improve Berg score to >/= 45/56 to improve safety  stability with ADLs in standing and reduce risk for falls    Baseline 32/56    Status Achieved   10/29/20 Merrilee Jansky = 46/56                  Plan - 10/29/20 1408     Clinical Impression Statement Paulanthony has demonstrated excellent progress with PT. B LE strength significantly improved with overall strength now 5/5 with exceptions of L hip extension 4+/5 and L ankle PF 3/5. His gait speed has improved to 3.28 ft/sec with single hiking pole and 3.32 ft/sec with RW. His balance has significantly improved with TUG decreased from 21.53 sec with RW to 9.46 sec with RW and 9.25 sec with single hiking pole and Berg improved from 32/56 to 46/56, both indicating a reduction in fall risk. He has been able to resume walking in his neighborhood with no concerns noted. All goals met and Herbie Baltimore  is pleased with his progress and feels ready to transition to his HEP. He will remain on hold for 30 days in the event that issues arise with HEP that would necessitate a return to PT.    PT Treatment/Interventions ADLs/Self Care Home Management;DME Instruction;Gait training;Stair training;Functional mobility training;Therapeutic activities;Therapeutic exercise;Balance training;Neuromuscular re-education;Patient/family education;Manual techniques;Passive range of motion;Dry needling    PT Next Visit Plan transition to his HEP + 30-day hold    PT Home Exercise Plan MedBridge Access Codes: [email or web address] 08/16/2022);  [id number] (4/18);  [id number] (4/27);  [place] (6/29)    Consulted and Agree with Plan of Care Patient             Patient will benefit from skilled therapeutic intervention in order to improve the following deficits and impairments:     Visit Diagnosis: Unsteadiness on feet  Ataxic gait  Muscle weakness (generalized)  History of falling     Problem List Patient Active Problem List   Diagnosis Date Noted   Neuropathy 12/12/2018   Cerebellar ataxia ([place]) 12/12/2018   Vitreous membranes and strands  10/20/2018   Bilateral cold feet 04/07/2017   Osteopenia of multiple sites 04/07/2017   Vitamin D deficiency 04/07/2017   CAD in native artery 03/09/2017   Old MI (myocardial infarction) 03/09/2017   Carpal tunnel syndrome, 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   Ulnar neuropathy at elbow, 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   PVC's (premature ventricular contractions) 10/09/2015   Chronic dryness of both eyes 09/26/2015   Hypermetropia 09/26/2015   Presbyopia 09/26/2015   Regular astigmatism 09/26/2015   Hypermetropia of both eyes 09/26/2015   Senile nuclear sclerosis 09/26/2015   Stationary peripheral pterygium of right eye 09/26/2015   Unspecified esotropia 09/26/2015   Ventral hernia without obstruction or gangrene 07/30/2015   Dyslipidemia 05/09/2015   OSA (obstructive sleep apnea) 05/09/2015   S/P CABG (coronary artery bypass graft) 05/09/2015   Unspecified atrial flutter ([place]) 05/09/2015    Percival Spanish, PT, MPT 10/29/2020, 4:27 PM  [facility] [address]  [place], [place], [address] Phone: [phone]   Fax:  [phone]  Name: ALEXES LAMARQUE MRN: [id number] Date of Birth: 1940-07-31   PHYSICAL THERAPY DISCHARGE SUMMARY  Visits from Start of Care: 26  Current functional level related to goals / functional outcomes:   Refer to above clinical impression for status as of last visit on 10/29/2020. Patient was placed on hold for 30 days and has not needed to return to PT, therefore will proceed with discharge from PT for this episode.   Remaining deficits:   As above.   Education / Equipment:   HEP   Patient agrees to discharge. Patient goals were met. Patient is being discharged due to being pleased with the current functional level.   Percival Spanish, PT, MPT 01/09/21, 3:18 PM  [facility] [address]  [place], [place], [address] Phone: [phone]   Fax:   [phone]

## 2021-08-08 IMAGING — DX DG HIP (WITH OR WITHOUT PELVIS) 2-3V*R*
3 series · 3 of 3 positions shown · non-contrast
Comparison: None.

CLINICAL DATA: Right hip pain post fall.

EXAM:
DG HIP (WITH OR WITHOUT PELVIS) 2-3V RIGHT

[pelvis ap]
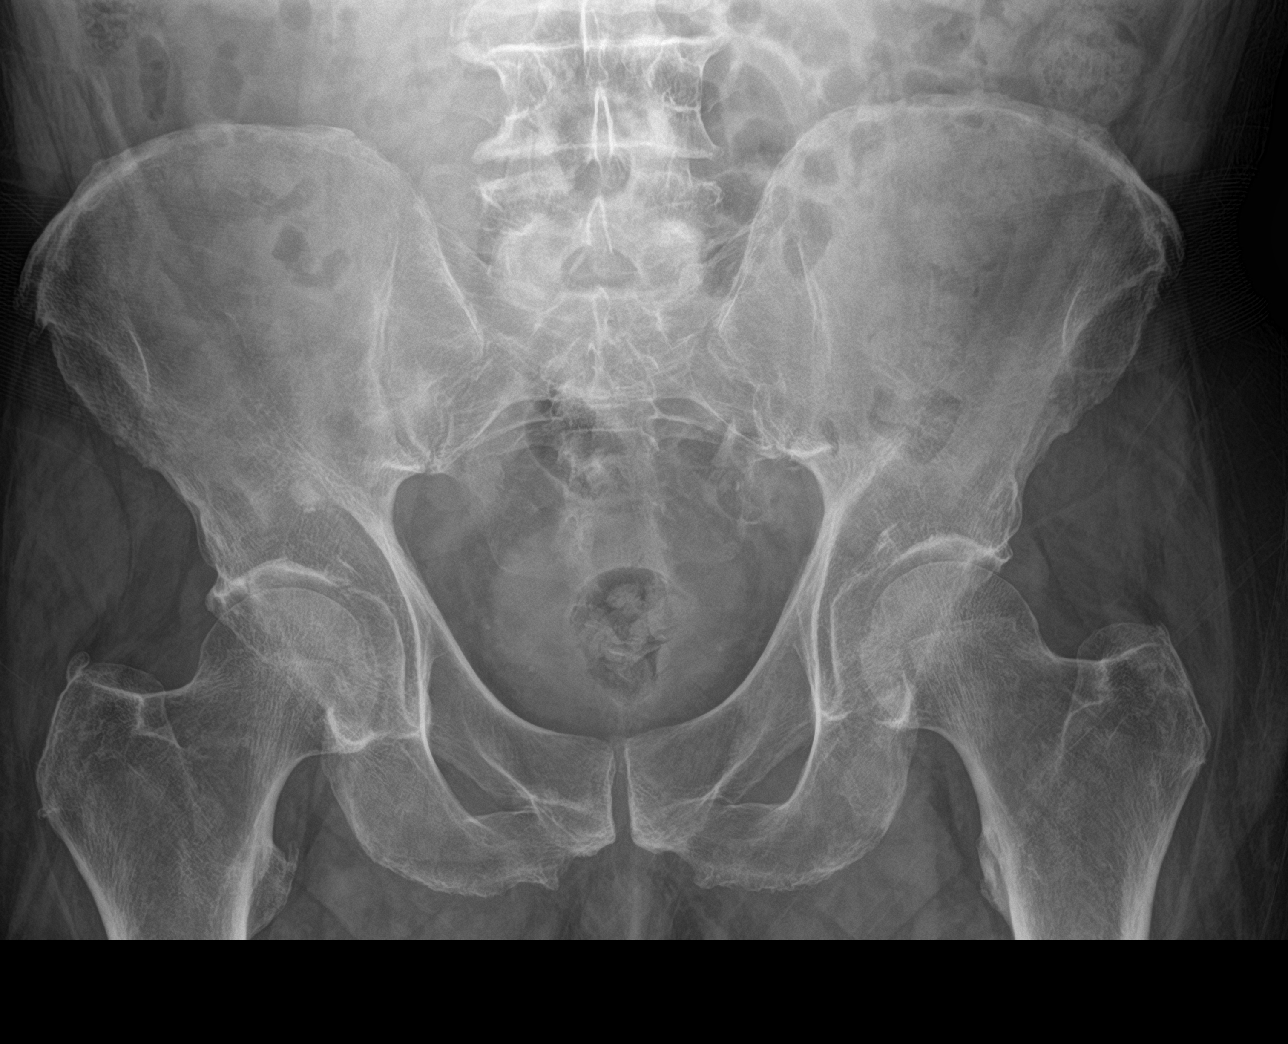

[hip ap]
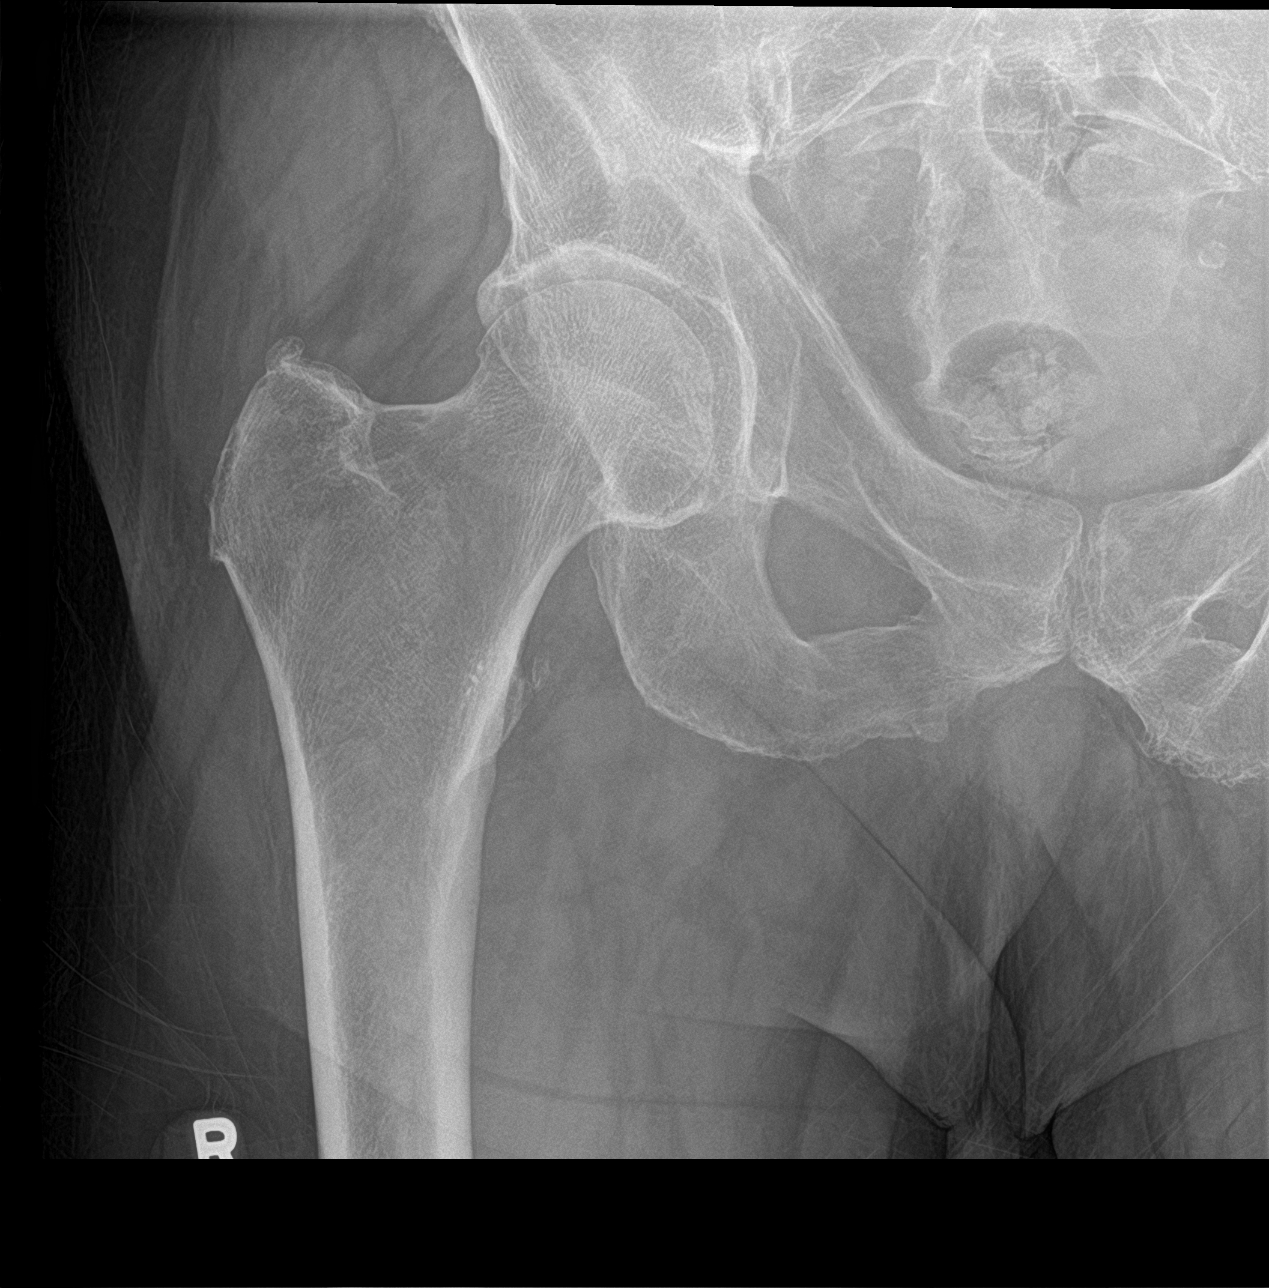

[hip lat]
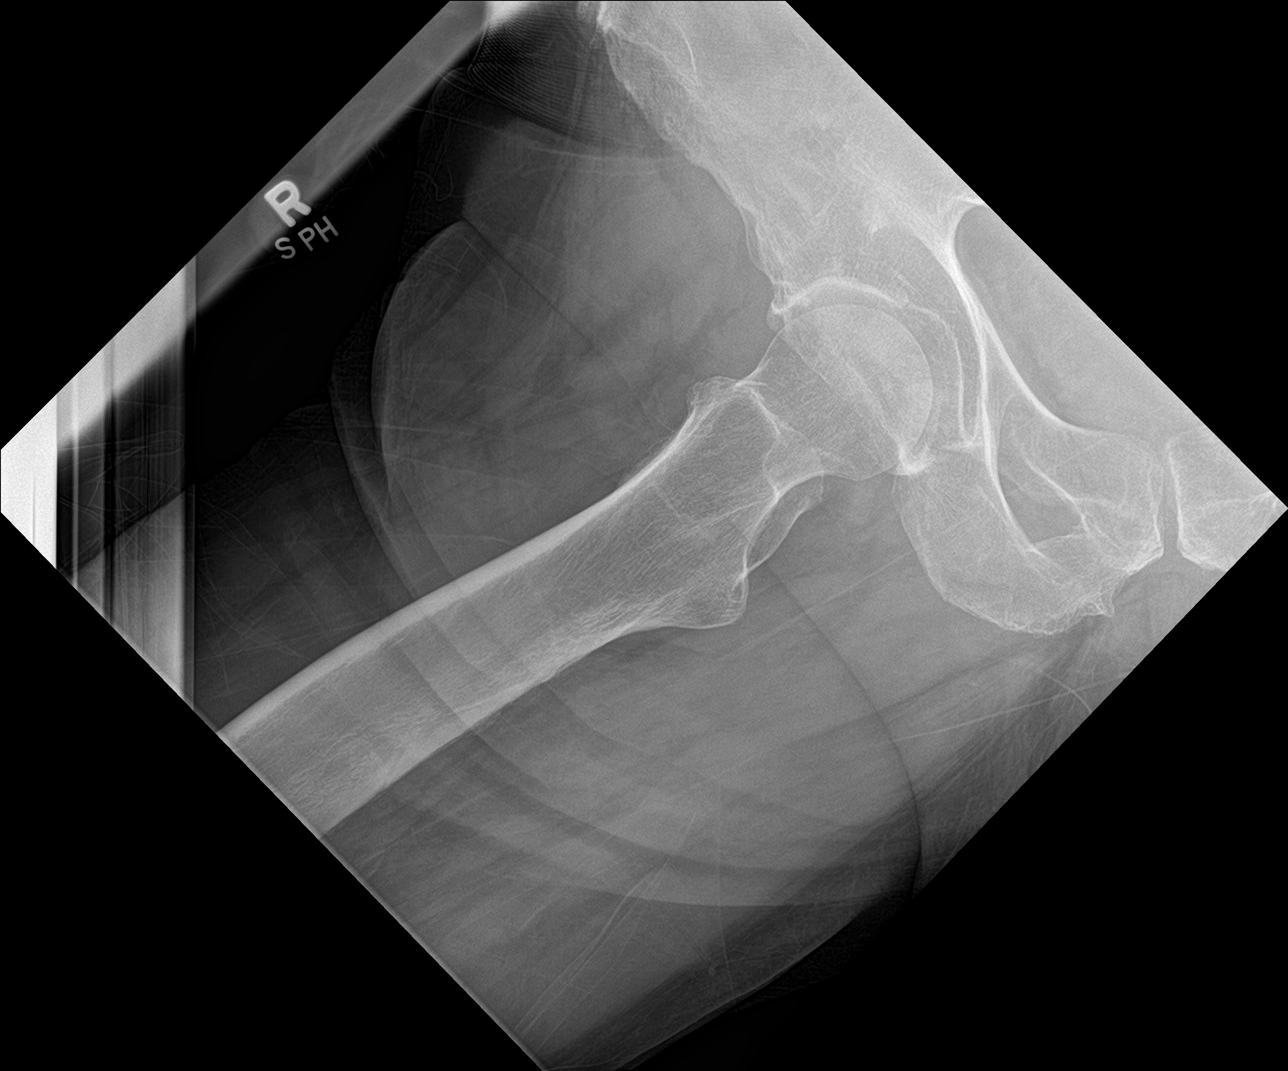

[3 of 3 positions shown; findings below may reference images not displayed]

FINDINGS: There is no evidence of hip fracture or dislocation. There is no
evidence of arthropathy or other focal bone abnormality.
IMPRESSION: Negative.

## 2021-09-19 ENCOUNTER — Other Ambulatory Visit (HOSPITAL_COMMUNITY): Payer: Self-pay | Admitting: *Deleted

## 2021-09-23 ENCOUNTER — Ambulatory Visit (HOSPITAL_COMMUNITY)
Admission: RE | Admit: 2021-09-23 | Discharge: 2021-09-23 | Disposition: A | Discharge status: Home / Self Care | Payer: Medicare HMO | Source: Ambulatory Visit | Attending: Endocrinology | Admitting: Endocrinology

## 2021-09-23 DIAGNOSIS — M810 Age-related osteoporosis without current pathological fracture: Secondary | ICD-10-CM | POA: Insufficient documentation

## 2021-09-23 MED ORDER — DENOSUMAB 60 MG/ML ~~LOC~~ SOSY
PREFILLED_SYRINGE | SUBCUTANEOUS | Status: AC
  Administered 2021-09-23: 60 mg via SUBCUTANEOUS
  Filled 2021-09-23: qty 1

## 2021-09-23 MED ORDER — DENOSUMAB 60 MG/ML ~~LOC~~ SOSY: 60.0000 mg | PREFILLED_SYRINGE | Freq: Once | SUBCUTANEOUS | Status: AC

## 2022-03-26 ENCOUNTER — Encounter (HOSPITAL_COMMUNITY): Payer: Medicare HMO

## 2022-03-30 ENCOUNTER — Other Ambulatory Visit (HOSPITAL_COMMUNITY): Payer: Self-pay | Admitting: *Deleted

## 2022-03-31 ENCOUNTER — Ambulatory Visit (HOSPITAL_COMMUNITY)
Admission: RE | Admit: 2022-03-31 | Discharge: 2022-03-31 | Disposition: A | Discharge status: Home / Self Care | Payer: Medicare HMO | Source: Ambulatory Visit | Attending: Endocrinology | Admitting: Endocrinology

## 2022-03-31 DIAGNOSIS — M810 Age-related osteoporosis without current pathological fracture: Secondary | ICD-10-CM | POA: Diagnosis present

## 2022-03-31 MED ORDER — DENOSUMAB 60 MG/ML ~~LOC~~ SOSY
60.0000 mg | PREFILLED_SYRINGE | Freq: Once | SUBCUTANEOUS | Status: AC
  Administered 2022-03-31: 60 mg via SUBCUTANEOUS

## 2022-03-31 MED ORDER — DENOSUMAB 60 MG/ML ~~LOC~~ SOSY
PREFILLED_SYRINGE | SUBCUTANEOUS | Status: AC
  Filled 2022-03-31: qty 1

## 2022-05-23 ENCOUNTER — Encounter (HOSPITAL_BASED_OUTPATIENT_CLINIC_OR_DEPARTMENT_OTHER): Payer: Self-pay | Admitting: Urology

## 2022-05-23 DIAGNOSIS — Z7982 Long term (current) use of aspirin: Secondary | ICD-10-CM | POA: Insufficient documentation

## 2022-05-23 DIAGNOSIS — T8131XA Disruption of external operation (surgical) wound, not elsewhere classified, initial encounter: Secondary | ICD-10-CM | POA: Insufficient documentation

## 2022-05-23 NOTE — ED Triage Notes (Signed)
Left calf leg wound, reopened, had dissolvable sutures that were no longer there  H/o mose sx to same area on 1/6  Wound is [place], bleeding controlled

## 2022-05-24 ENCOUNTER — Emergency Department (HOSPITAL_BASED_OUTPATIENT_CLINIC_OR_DEPARTMENT_OTHER)
Admission: EM | Admit: 2022-05-24 | Discharge: 2022-05-24 | Disposition: A | Discharge status: Home / Self Care | Payer: Medicare HMO | Attending: Emergency Medicine | Admitting: Emergency Medicine

## 2022-05-24 DIAGNOSIS — T8130XA Disruption of wound, unspecified, initial encounter: Secondary | ICD-10-CM

## 2022-05-24 MED ORDER — CEPHALEXIN 500 MG PO CAPS: 500.0000 mg | ORAL_CAPSULE | Freq: Four times a day (QID) | ORAL | 0 refills | Status: AC

## 2022-05-24 MED ORDER — MUPIROCIN CALCIUM 2 % EX CREA: 1.0000 | TOPICAL_CREAM | Freq: Two times a day (BID) | CUTANEOUS | 0 refills | Status: AC

## 2022-05-24 MED ORDER — CEPHALEXIN 250 MG PO CAPS
500.0000 mg | ORAL_CAPSULE | Freq: Once | ORAL | Status: AC
  Administered 2022-05-24: 500 mg via ORAL
  Filled 2022-05-24: qty 2

## 2022-05-24 NOTE — ED Provider Notes (Signed)
[place] [facility] Provider Note   CSN: [id number] Arrival date & time: 05/23/22  2303     History  Chief Complaint  Patient presents with   Wound Dehiscence    Andre [name] is a 82 y.o. male.   Wound Check The current episode started more than 1 week ago (patient reports MOHs surgery on 05/06/22 no left calf). The problem occurs constantly. Progression since onset: tripped and wound 4 inches in length opened. Nothing aggravates the symptoms. Nothing relieves the symptoms. He has tried nothing for the symptoms. The treatment provided no relief.  Is here because his MOHs surgery wound on L calf opened this evening.  No drainage, hemostatic      Home Medications Prior to Admission medications   Medication Sig Start Date End Date Taking? Authorizing Provider  cephALEXin (KEFLEX) 500 MG capsule Take 1 capsule (500 mg total) by mouth 4 (four) times daily. 05/24/22  Yes Aryka Coonradt, MD  mupirocin cream (BACTROBAN) 2 % Apply 1 Application topically 2 (two) times daily. 05/24/22  Yes Marlene Beidler, MD  aspirin 81 MG chewable tablet Chew by mouth.    [provider]  aspirin 81 MG tablet Take 81 mg by mouth daily.    [provider]  atorvastatin (LIPITOR) 40 MG tablet Take 40 mg by mouth at bedtime.    [provider]  Cholecalciferol (VITAMIN D) 125 MCG (5000 UT) CAPS Take by mouth.    [provider]  Coenzyme Q10 (CO Q 10 PO) Take by mouth daily.    [provider]  diclofenac Sodium (VOLTAREN) 1 % GEL Apply 4 g topically 4 (four) times daily. Patient not taking: Reported on 07/29/2020 06/22/20   Deno Etienne, DO  gabapentin (NEURONTIN) 300 MG capsule Take 1 capsule (300 mg total) by mouth at bedtime. Patient not taking: Reported on 07/29/2020 01/26/19   Tat, Eustace Quail, DO  pyridOXINE (VITAMIN B-6) 100 MG tablet Take 100 mg by mouth daily.    [provider]      Allergies    Vicodin  [hydrocodone-acetaminophen] and Hydrocodone-acetaminophen    Review of Systems   Review of Systems  Constitutional:  Negative for fever.  HENT:  Negative for facial swelling.   Respiratory:  Negative for wheezing and stridor.   Gastrointestinal:  Negative for vomiting.  Skin:  Positive for wound.  All other systems reviewed and are negative.   Physical Exam Updated Vital Signs BP 112/85   Pulse 60   Temp 98.4 F (36.9 C) (Oral)   Resp 20   Ht 5\' 11"  (1.803 m)   Wt 95.3 kg   SpO2 98%   BMI 29.30 kg/m  Physical Exam Vitals and nursing note reviewed.  Constitutional:      General: He is not in acute distress.    Appearance: He is well-developed. He is not diaphoretic.  HENT:     Head: Normocephalic and atraumatic.     Nose: Nose normal.  Eyes:     Conjunctiva/sclera: Conjunctivae normal.     Pupils: Pupils are equal, round, and reactive to light.  Cardiovascular:     Rate and Rhythm: Normal rate and regular rhythm.  Pulmonary:     Effort: Pulmonary effort is normal.     Breath sounds: Normal breath sounds. No wheezing or rales.  Abdominal:     General: Bowel sounds are normal.     Palpations: Abdomen is soft.     Tenderness: There is  no abdominal tenderness. There is no guarding or rebound.  Musculoskeletal:        General: Normal range of motion.     Cervical back: Normal range of motion and neck supple.       Legs:  Skin:    General: Skin is warm and dry.     Capillary Refill: Capillary refill takes less than 2 seconds.  Neurological:     General: No focal deficit present.     Mental Status: He is alert and oriented to person, place, and time.     ED Results / Procedures / Treatments   Labs (all labs ordered are listed, but only abnormal results are displayed) Labs Reviewed - No data to display  EKG None  Radiology No results found.  Procedures Procedures    Medications Ordered in ED Medications  cephALEXin (KEFLEX) capsule 500 mg (has no  administration in time range)    ED Course/ Medical Decision Making/ A&P                             Medical Decision Making Patient with wound dehiscence   Amount and/or Complexity of Data Reviewed External Data Reviewed: notes.    Details: Previous notes reviewed   Risk Prescription drug management. Risk Details: Patient with wound dehiscence.  It is too old to be closed by me in the ED>  Extensive wound care in the ED with non-adherent sterile dressing placed.  Will start keflex QID and topical bactroban twice daily.  Call you [facility] surgeon to be seen in follow up.  Wound care instructions given verbally and in writing.  Stable for discharge.  Strict return.      Final Clinical Impression(s) / ED Diagnoses Final diagnoses:  Wound dehiscence  Return for intractable cough, coughing up blood, fevers > 100.4 unrelieved by medication, shortness of breath, intractable vomiting, chest pain, shortness of breath, weakness, numbness, changes in speech, facial asymmetry, abdominal pain, passing out, Inability to tolerate liquids or food, cough, altered mental status or any concerns. No signs of systemic illness or infection. The patient is nontoxic-appearing on exam and vital signs are within normal limits.  I have reviewed the triage vital signs and the nursing notes. Pertinent labs & imaging results that were available during my care of the patient were reviewed by me and considered in my medical decision making (see chart for details). After history, exam, and medical workup I feel the patient has been appropriately medically screened and is safe for discharge home. Pertinent diagnoses were discussed with the patient. Patient was given return precautions.  Rx / DC Orders ED Discharge Orders          Ordered    cephALEXin (KEFLEX) 500 MG capsule  4 times daily        05/24/22 0328    mupirocin cream (BACTROBAN) 2 %  2 times daily        05/24/22 0328              Caitlyne Ingham,  MD 05/24/22 [phone]

## 2022-05-24 NOTE — ED Notes (Signed)
Pt had a old surgical site incision that "busted open" today when he lost his balance and fell.  Incision was open but not bleeding at this time.  RN cleaned wound, placed bacitracin and covered wound. Pt was pleased and happy to be going home.  Pt ambulated out of the department very well using his walker.

## 2022-08-27 ENCOUNTER — Other Ambulatory Visit: Payer: Self-pay

## 2022-08-27 DIAGNOSIS — M810 Age-related osteoporosis without current pathological fracture: Secondary | ICD-10-CM

## 2022-09-09 ENCOUNTER — Telehealth: Payer: Self-pay | Admitting: Pharmacy Technician

## 2022-09-09 NOTE — Telephone Encounter (Addendum)
 Auth Submission: APPROVED Site of care: Site of care: [facility] Payer: [employer] MEDICARE Medication & CPT/J Code(s) submitted: Prolia  (Denosumab ) [id number] Route of submission (phone, fax, portal):  Phone # [phone] Fax # Auth type: Buy/Bill Units/visits requested: 2 DOSES Reference number: [id number] Approval from: 09/09/22 to 09/09/23   PA RENEWAL: [id number] 08/31/23 - 08/30/24 X 2 DOSES

## 2022-09-17 ENCOUNTER — Ambulatory Visit: Payer: Medicare HMO

## 2022-09-17 MED ORDER — DENOSUMAB 60 MG/ML ~~LOC~~ SOSY: 60.0000 mg | PREFILLED_SYRINGE | Freq: Once | SUBCUTANEOUS | Status: DC

## 2022-09-17 NOTE — Progress Notes (Signed)
Injection not given. Not yet due. Made appt for October 02, 2022.

## 2022-10-02 ENCOUNTER — Ambulatory Visit: Payer: Medicare HMO

## 2022-10-02 ENCOUNTER — Ambulatory Visit (INDEPENDENT_AMBULATORY_CARE_PROVIDER_SITE_OTHER): Payer: Medicare HMO

## 2022-10-02 VITALS — BP 147/76 | HR 66 | Temp 98.0°F | Resp 18 | Ht 71.0 in | Wt 217.6 lb

## 2022-10-02 DIAGNOSIS — M810 Age-related osteoporosis without current pathological fracture: Secondary | ICD-10-CM

## 2022-10-02 MED ORDER — DENOSUMAB 60 MG/ML ~~LOC~~ SOSY
60.0000 mg | PREFILLED_SYRINGE | Freq: Once | SUBCUTANEOUS | Status: AC
  Administered 2022-10-02: 60 mg via SUBCUTANEOUS
  Filled 2022-10-02: qty 1

## 2022-10-02 NOTE — Progress Notes (Signed)
Diagnosis: Osteoporosis  Provider:  Mannam, Praveen MD  Procedure: Injection  Prolia (Denosumab), Dose: 60 mg, Site: subcutaneous, Number of injections: 1  Post Care:     Discharge: Condition: Good, Destination: Home . AVS Provided  Performed by:  Juergen Hardenbrook, RN       

## 2023-03-11 LAB — BASIC METABOLIC PANEL WITH GFR: EGFR (Non-African Amer.): 71.7

## 2023-04-05 ENCOUNTER — Ambulatory Visit: Payer: Medicare HMO | Admitting: *Deleted

## 2023-04-05 VITALS — BP 160/74 | HR 59 | Temp 97.9°F | Resp 18 | Ht 71.0 in | Wt 220.6 lb

## 2023-04-05 DIAGNOSIS — M810 Age-related osteoporosis without current pathological fracture: Secondary | ICD-10-CM | POA: Diagnosis not present

## 2023-04-05 MED ORDER — DENOSUMAB 60 MG/ML ~~LOC~~ SOSY
60.0000 mg | PREFILLED_SYRINGE | Freq: Once | SUBCUTANEOUS | Status: AC
  Administered 2023-04-05: 60 mg via SUBCUTANEOUS
  Filled 2023-04-05: qty 1

## 2023-04-05 NOTE — Progress Notes (Signed)
Diagnosis: Osteoporosis  Provider:  Chilton Greathouse MD  Procedure: Injection  Prolia (Denosumab), Dose: 60 mg, Site: subcutaneous, Number of injections: 1  Injection Site(s): Left arm  Post Care: Observation period completed  Discharge: Condition: Good, Destination: Home . AVS Provided  Performed by:  Forrest Moron, RN

## 2023-07-07 NOTE — Therapy (Signed)
 OUTPATIENT PHYSICAL THERAPY NEURO EVALUATION   Patient Name: Andre [name] MRN: [id number] DOB:[birth year], 83 y.o., male Today's Date: 07/12/2023   END OF SESSION:  PT End of Session - 07/12/23 1400     Visit Number 1    Date for PT Re-Evaluation 09/06/23    Authorization Type [employer]    Progress Note Due on Visit 10    PT Start Time 1400    PT Stop Time 1445    PT Time Calculation (min) 45 min    Activity Tolerance Patient tolerated treatment well    Behavior During Therapy WFL for tasks assessed/performed             Past Medical History:  Diagnosis Date   Carpal tunnel syndrome    Cerebellar ataxia (HCC)    spinal cerebellar ataxia   Dyslipidemia    H/O rotator cuff surgery    Obstructive sleep apnea    Past Surgical History:  Procedure Laterality Date   BYPASS GRAFT AORTA TO AORTA     quadruple bypass   CARPAL TUNNEL RELEASE Left    EYE SURGERY Bilateral    SHOULDER SURGERY     bilateral   TONSILLECTOMY     VASECTOMY     Patient Active Problem List   Diagnosis Date Noted   Osteoporosis, senile 08/27/2022   Neuropathy 12/12/2018   Cerebellar ataxia (HCC) 12/12/2018   Vitreous membranes and strands 10/20/2018   Bilateral cold feet 04/07/2017   Osteopenia of multiple sites 04/07/2017   Vitamin D deficiency 04/07/2017   CAD in native artery 03/09/2017   Old MI (myocardial infarction) 03/09/2017   Carpal tunnel syndrome, 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   Ulnar neuropathy at elbow, 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   PVC's (premature ventricular contractions) 10/09/2015   Chronic dryness of both eyes 09/26/2015   Hypermetropia 09/26/2015   Presbyopia 09/26/2015   Regular astigmatism 09/26/2015   Hypermetropia of both eyes 09/26/2015   Senile nuclear sclerosis 09/26/2015   Stationary peripheral pterygium of right eye 09/26/2015   Unspecified esotropia 09/26/2015   Ventral hernia without obstruction or gangrene 07/30/2015   Dyslipidemia 05/09/2015   OSA (obstructive sleep  apnea) 05/09/2015   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  Arrives to PT with RW but states he also has a 4WW and hiking poles which he uses intermixed. His wife feels like he has been more unsteady of late.  He notes some R knee pain a few months back but now resolved.  Works with Systems analyst 2x/wk - STS, dumbbells, planks, etc.  Sees a chiropractor for his back 1x/month.  Pt accompanied by: self  PAIN: Are you having pain? No  PERTINENT HISTORY:  Chronic spinal cerebellar ataxia, neuropathy, osteoporosis, R sided pelvic fracture s/p fall 05/2020, MI/CAD s/p CABG x 4, Aflutter, GERD, B shoulder surgery/RCR, L carpal tunnel release, OSA  PRECAUTIONS: Fall  RED FLAGS: None  WEIGHT BEARING RESTRICTIONS: No  FALLS:  Has patient fallen in last 6 months? No  LIVING ENVIRONMENT: Lives with: lives with their family Lives in: House/apartment Stairs: Yes: Internal: 14 steps; on right going up and on left going up and External: 1 or 3 steps; on right going up and on left going up Has following equipment at home: Dan Humphreys - 2 wheeled, Environmental consultant - 4 wheeled, shower chair, Grab bars, and hiking poles  OCCUPATION: Retired  PLOF: Independent, Independent with  community mobility with device, and Leisure: community outings with friends, walking loops in driveway, driving with son (adult CP)   PATIENT GOALS: "To feel more confident walking with the walking sticks."   OBJECTIVE: (objective measures completed at initial evaluation unless otherwise dated)  DIAGNOSTIC FINDINGS:  06/22/23 - XR L toes (Left great toe skin breakdown) IMPRESSION:  Mild soft tissue swelling. No radiographic evidence of osteomyelitis.  Sonogram negative for DVT  COGNITION: Overall cognitive status: Within functional limits for tasks assessed   SENSATION: B LE neuropathy - predominantly in B feet  COORDINATION: Gross motor mild impairment B LE  EDEMA:  Intermittent distal LE edema  POSTURE:  No Significant postural limitations  MUSCLE LENGTH: Hamstrings: severe tight B ITB: mod tight L>R Piriformis: mod/severe tight B Hip flexors: mod tight B Quads: mild tight B Heelcord:   LOWER EXTREMITY ROM:    Grossly WFL other than restrictions related to above muscle tightness  LOWER EXTREMITY MMT:    MMT Right eval Left eval  Hip flexion 4+ 4  Hip extension 4 4  Hip abduction 4 4  Hip adduction 4+ 4+  Hip internal rotation 4+ 4+  Hip external rotation 4 4  Knee flexion 5 5  Knee extension 5 5  Ankle dorsiflexion 4+ 4  Ankle plantarflexion    Ankle inversion    Ankle eversion    (Blank rows = not tested)  BED MOBILITY:  Independent  TRANSFERS: Assistive device utilized: Environmental consultant - 2 wheeled  Sit to stand: SBA Stand to sit: SBA Chair to chair:    Floor:     GAIT: Distance walked: clinic distances Assistive device utilized: Environmental consultant - 2 wheeled Level of assistance: Modified independence and SBA Gait pattern: step to pattern, decreased hip/knee flexion- Right, decreased hip/knee flexion- Left, ataxic, and trunk flexed Comments:   RAMP: Level of Assistance:  NT Assistive device utilized:    Ramp Comments:   CURB:  Level of Assistance:   NT Assistive device utilized:    Curb Comments:   STAIRS:  Level of Assistance:  NT  Stair Negotiation Technique:   Number of Stairs:    Height of Stairs:   Comments:   FUNCTIONAL TESTS: (Remaining assessments to be completed next visit) 5  times sit to stand: 16.47 sec w/o UE assist Timed up and go (TUG): 20.63 sec with RW 10 meter walk test: 32.68 sec with RW; 1.00 ft/sec with RW  Berg Balance Scale:   Dynamic Gait Index:   MCTSIB: Condition 1: Avg of 3 trials: 30 sec, Condition 2: Avg of 3 trials: 8.67 sec, Condition 3: Avg of 3 trials: 30 sec, Condition 4: Avg of 3 trials: 4 sec, and Total Score: 72.67/120  PATIENT SURVEYS:  ABC scale 360 / 1600 = 22.5 %; <50% indicates low level of physical functioning and risk for recurrent falls   TODAY'S TREATMENT:   07/12/2023 - Eval   PATIENT EDUCATION:  Education details: PT eval findings, anticipated POC, and need for further assessment of static and dynamic standing balance   Person educated: Patient Education method: Explanation Education comprehension: verbalized understanding  HOME EXERCISE PROGRAM: TBD   ASSESSMENT:  CLINICAL IMPRESSION: MIKYLE SOX is a 83 y.o. male who was referred to physical therapy for evaluation and treatment for gait instability and balance disorder.  Patient presents with physical impairments of impaired activity tolerance, impaired standing balance, impaired ambulation, and decreased safety awareness impacting safe and independent functional mobility.  Examination revealed patient is at risk for falls and functional decline as evidenced by the following objective test measures: Gait speed 1.00 ft/sec, (2.62 ft/sec is needed for community access), mCTSIB: position 1: 30 sec, position 2: 8.67 sec, position 3: 30 sec, position 4: 4 sec (30sec in each position demonstrates equal weighting of balance systems), TUG of 20.63 sec (>13.5 sec indicates increased risk for falls), and 5xSTS of 16.47 sec (>15  sec indicates increased risk for falls and decreased BLE power).  Further testing with Sharlene Motts and DGI to be completed next visit.  ABC scale score of 22.5% indicates low level of physical functioning and high risk for recurrent falls.   Jacai will benefit from skilled PT to address above deficits to improve mobility and activity tolerance to help reach the maximal level of functional independence and mobility. Patient demonstrates understanding of this POC and is in agreement with this plan.   OBJECTIVE IMPAIRMENTS: Abnormal gait, decreased activity tolerance, decreased balance, decreased coordination, decreased endurance, decreased knowledge of use of DME, decreased mobility, difficulty walking, decreased ROM, decreased strength, decreased safety awareness, impaired perceived functional ability, impaired flexibility, impaired sensation, improper body mechanics, and postural dysfunction.   ACTIVITY LIMITATIONS: carrying, lifting, bending, standing, squatting, stairs, transfers, bathing, dressing, reach over head, locomotion level, and caring for others  PARTICIPATION LIMITATIONS: meal prep, cleaning, laundry, driving, shopping, community activity, and yard work  PERSONAL FACTORS: Age, Past/current experiences, Time since onset of injury/illness/exacerbation, and 3+ comorbidities: Chronic spinal cerebellar ataxia, neuropathy, osteoporosis, R sided pelvic fracture s/p fall 05/2020, MI/CAD s/p CABG x 4, Aflutter, GERD, B shoulder surgery/RCR, L carpal tunnel release, OSA  are also affecting patient's functional outcome.   REHAB POTENTIAL: Good  CLINICAL DECISION MAKING: Unstable/unpredictable  EVALUATION COMPLEXITY: High   GOALS: Goals reviewed with patient? Yes  SHORT TERM GOALS: Target date: 08/09/2023  Patient will be independent with initial HEP to improve outcomes and carryover.  Baseline:  Goal status: INITIAL  2.  Patient will be educated on strategies to decrease risk of falls.   Baseline:  Goal status: INITIAL  3.  Patient will demonstrate decreased 5xSTS time to </= 14.8 sec to decrease risk for falls with transitional mobility. Baseline: 16.47 sec Goal status: INITIAL  LONG TERM GOALS: Target date: 09/06/2023  Patient  will be independent with advanced/ongoing HEP to facilitate ability to maintain/progress functional gains from skilled physical therapy services. Baseline:  Goal status: INITIAL  2.  Patient will be able to ambulate 600' with LRAD on variable surfaces with good safety to access community.  Baseline:  Goal status: INITIAL  3.  Patient will be able to step up/down curb safely with LRAD for safety with community ambulation.  Baseline:  Goal status: INITIAL   4.  Patient will demonstrate improved B LE strength to >/= 4+/5 for improved stability and ease of mobility . Baseline: Refer to above LE MMT table Goal status: INITIAL  5.  Patient will improve TUG time to </= 15 seconds with walking sticks or LRAD for improved efficiency and safety with transfers. Baseline: 20.63 sec with RW Goal status: INITIAL   6.  Patient will demonstrate gait speed of >/= 1.8 ft/sec (0.55 m/s) with walking sticks or LRAD to be a safe limited community ambulator with decreased risk for recurrent falls.  Baseline: 1.00 ft/sec Goal status: INITIAL  7.  Patient will improve Berg score by at least 8 points to improve safety and stability with ADLs in standing and reduce risk for falls. (MCID= 8 points)  Baseline: TBD Goal status: INITIAL  8.  Patient will demonstrate at least 3 point improvement on DGI to improve gait stability and reduce risk for falls. Baseline: TBD Goal status: INITIAL  9. Patient will report >/= 40% on ABC scale to demonstrate improved balance confidence with functional mobility and gait. Baseline: 360 / 1600 = 22.5 % Goal status: INITIAL   PLAN:  PT FREQUENCY: 2x/week  PT DURATION: 8 weeks  PLANNED INTERVENTIONS: 97164- PT  Re-evaluation, 97110-Therapeutic exercises, 97530- Therapeutic activity, 97112- Neuromuscular re-education, 97535- Self Care, 16109- Manual therapy, [phone]- Gait training, Patient/Family education, Balance training, Stair training, Taping, Dry Needling, DME instructions, Cryotherapy, and Moist heat  PLAN FOR NEXT SESSION: Complete balance assessment - Berg and DGI; create initial HEP for LE strengthening and balance   Marry Guan, PT 07/12/2023, 7:33 PM

## 2023-07-12 ENCOUNTER — Encounter: Payer: Self-pay | Admitting: Physical Therapy

## 2023-07-12 ENCOUNTER — Other Ambulatory Visit: Payer: Self-pay

## 2023-07-12 ENCOUNTER — Ambulatory Visit: Payer: Medicare HMO | Attending: Family Medicine | Admitting: Physical Therapy

## 2023-07-12 DIAGNOSIS — R2681 Unsteadiness on feet: Secondary | ICD-10-CM | POA: Diagnosis present

## 2023-07-12 DIAGNOSIS — M6281 Muscle weakness (generalized): Secondary | ICD-10-CM | POA: Insufficient documentation

## 2023-07-12 DIAGNOSIS — R260 Ataxic gait: Secondary | ICD-10-CM | POA: Insufficient documentation

## 2023-07-19 ENCOUNTER — Encounter: Payer: Self-pay | Admitting: Physical Therapy

## 2023-07-19 ENCOUNTER — Ambulatory Visit: Admitting: Physical Therapy

## 2023-07-19 DIAGNOSIS — M6281 Muscle weakness (generalized): Secondary | ICD-10-CM

## 2023-07-19 DIAGNOSIS — R260 Ataxic gait: Secondary | ICD-10-CM

## 2023-07-19 DIAGNOSIS — R2681 Unsteadiness on feet: Secondary | ICD-10-CM | POA: Diagnosis not present

## 2023-07-19 NOTE — Therapy (Signed)
 OUTPATIENT PHYSICAL THERAPY TREATMENT   Patient Name: Andre [name] MRN: [id number] DOB:1940/11/30, 83 y.o., male Today's Date: 07/19/2023   END OF SESSION:  PT End of Session - 07/19/23 1406     Visit Number 2    Date for PT Re-Evaluation 09/06/23    Authorization Type [employer]    Progress Note Due on Visit 10    PT Start Time 1406    PT Stop Time 1451    PT Time Calculation (min) 45 min    Activity Tolerance Patient tolerated treatment well    Behavior During Therapy WFL for tasks assessed/performed              Past Medical History:  Diagnosis Date   Carpal tunnel syndrome    Cerebellar ataxia (HCC)    spinal cerebellar ataxia   Dyslipidemia    H/O rotator cuff surgery    Obstructive sleep apnea    Past Surgical History:  Procedure Laterality Date   BYPASS GRAFT AORTA TO AORTA     quadruple bypass   CARPAL TUNNEL RELEASE Left    EYE SURGERY Bilateral    SHOULDER SURGERY     bilateral   TONSILLECTOMY     VASECTOMY     Patient Active Problem List   Diagnosis Date Noted   Osteoporosis, senile 08/27/2022   Neuropathy 12/12/2018   Cerebellar ataxia (HCC) 12/12/2018   Vitreous membranes and strands 10/20/2018   Bilateral cold feet 04/07/2017   Osteopenia of multiple sites 04/07/2017   Vitamin D deficiency 04/07/2017   CAD in native artery 03/09/2017   Old MI (myocardial infarction) 03/09/2017   Carpal tunnel syndrome, 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   Ulnar neuropathy at elbow, 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   PVC's (premature ventricular contractions) 10/09/2015   Chronic dryness of both eyes 09/26/2015   Hypermetropia 09/26/2015   Presbyopia 09/26/2015   Regular astigmatism 09/26/2015   Hypermetropia of both eyes 09/26/2015   Senile nuclear sclerosis 09/26/2015   Stationary peripheral pterygium of right eye 09/26/2015   Unspecified esotropia 09/26/2015   Ventral hernia without obstruction or gangrene 07/30/2015   Dyslipidemia 05/09/2015   OSA (obstructive sleep  apnea) 05/09/2015   S/P CABG (coronary artery bypass graft) 05/09/2015   Unspecified atrial flutter (HCC) 05/09/2015    PCP: Loyal Jacobson, MD   REFERRING PROVIDER: Loyal Jacobson, MD   REFERRING DIAG:  R26.81 (ICD-10-CM) - Unsteadiness on feet R26.89 (ICD-10-CM) - Other abnormalities of gait and mobility  THERAPY DIAG:  Unsteadiness on feet  Ataxic gait  Muscle weakness (generalized)  RATIONALE FOR EVALUATION AND TREATMENT: Rehabilitation  ONSET DATE: Chronic  NEXT MD VISIT: 12/16/23   SUBJECTIVE:  SUBJECTIVE STATEMENT: No new concerns verbalized today.  Eval: Pt reports how he feels depends on how much sleep he gets.  Arrives to PT with RW but states he also has a 4WW and hiking poles which he uses intermixed. His wife feels like he has been more unsteady of late.  He notes some R knee pain a few months back but now resolved.  Works with Systems analyst 2x/wk - STS, dumbbells, planks, etc.  Sees a chiropractor for his back 1x/month.  Pt accompanied by: self  PAIN: Are you having pain? No  PERTINENT HISTORY:  Chronic spinal cerebellar ataxia, neuropathy, osteoporosis, R sided pelvic fracture s/p fall 05/2020, MI/CAD s/p CABG x 4, Aflutter, GERD, B shoulder surgery/RCR, L carpal tunnel release, OSA  PRECAUTIONS: Fall  RED FLAGS: None  WEIGHT BEARING RESTRICTIONS: No  FALLS:  Has patient fallen in last 6 months? No  LIVING ENVIRONMENT: Lives with: lives with their family Lives in: House/apartment Stairs: Yes: Internal: 14 steps; on right going up and on left going up and External: 1 or 3 steps; on right going up and on left going up Has following equipment at home: Environmental consultant - 2 wheeled, Environmental consultant - 4 wheeled, shower chair, Grab bars, and hiking poles  OCCUPATION:  Retired  PLOF: Independent, Independent with community mobility with device, and Leisure: community outings with friends, walking loops in driveway, driving with son (adult CP); works with Systems analyst 2x/wk - STS, dumbbells, planks, etc   PATIENT GOALS: "To feel more confident walking with the walking sticks."   OBJECTIVE: (objective measures completed at initial evaluation unless otherwise dated)  DIAGNOSTIC FINDINGS:  06/22/23 - XR L toes (Left great toe skin breakdown) IMPRESSION:  Mild soft tissue swelling. No radiographic evidence of osteomyelitis.  Sonogram negative for DVT  COGNITION: Overall cognitive status: Within functional limits for tasks assessed   SENSATION: B LE neuropathy - predominantly in B feet  COORDINATION: Gross motor mild impairment B LE  EDEMA:  Intermittent distal LE edema  POSTURE:  No Significant postural limitations  MUSCLE LENGTH: Hamstrings: severe tight B ITB: mod tight L>R Piriformis: mod/severe tight B Hip flexors: mod tight B Quads: mild tight B Heelcord:   LOWER EXTREMITY ROM:    Grossly WFL other than restrictions related to above muscle tightness  LOWER EXTREMITY MMT:    MMT Right eval Left eval  Hip flexion 4+ 4  Hip extension 4 4  Hip abduction 4 4  Hip adduction 4+ 4+  Hip internal rotation 4+ 4+  Hip external rotation 4 4  Knee flexion 5 5  Knee extension 5 5  Ankle dorsiflexion 4+ 4  Ankle plantarflexion    Ankle inversion    Ankle eversion    (Blank rows = not tested)  BED MOBILITY:  Independent  TRANSFERS: Assistive device utilized: Environmental consultant - 2 wheeled  Sit to stand: SBA Stand to sit: SBA Chair to chair:    Floor:     GAIT: Distance walked: clinic distances Assistive device utilized: Environmental consultant - 2 wheeled Level of assistance: Modified independence and SBA Gait pattern: step to pattern, decreased hip/knee flexion- Right, decreased hip/knee flexion- Left, ataxic, and trunk flexed Comments:    RAMP: Level of Assistance:  NT Assistive device utilized:    Ramp Comments:   CURB:  Level of Assistance:  NT Assistive device utilized:    Curb Comments:   STAIRS:  Level of Assistance:  NT  Stair Negotiation Technique:   Number of Stairs:    Height  of Stairs:   Comments:   FUNCTIONAL TESTS:  5 times sit to stand: 16.47 sec w/o UE assist Timed up and go (TUG): 20.63 sec with RW 10 meter walk test: 32.68 sec with RW; 1.00 ft/sec with RW  Berg Balance Scale: 26/56; Scores of <36 indicate high risk for falls (close to 100%) - 07/19/23 Dynamic Gait Index: 10/24; Scores of 19 or less are predictive of falls in older community living adults - 07/19/23 MCTSIB: Condition 1: Avg of 3 trials: 30 sec, Condition 2: Avg of 3 trials: 8.67 sec, Condition 3: Avg of 3 trials: 30 sec, Condition 4: Avg of 3 trials: 4 sec, and Total Score: 72.67/120  PATIENT SURVEYS:  ABC scale 360 / 1600 = 22.5 %; <50% indicates low level of physical functioning and risk for recurrent falls   TODAY'S TREATMENT:   07/19/2023  PHYSICAL PERFORMANCE TEST or MEASUREMENT: Berg = 26/56; Scores of <36 indicate high risk for falls (close to 100%) DGI = 10/24; Scores of 19 or less are predictive of falls in older community living adults   [employer]   Sit to Stand Needs minimal aid to stand or to stabilize    Standing Unsupported Able to stand 2 minutes with supervision    Sitting with Back Unsupported but Feet Supported on Floor or Stool Able to sit safely and securely 2 minutes    Stand to Sit Uses backs of legs against chair to control descent    Transfers Able to transfer with verbal cueing and /or supervision    Standing Unsupported with Eyes Closed Able to stand 3 seconds    Standing Unsupported with Feet Together Needs help to attain position but able to stand for 30 seconds with feet together    From Standing, Reach Forward with Outstretched Arm Can reach forward >12 cm safely (5")    From Standing  Position, Pick up Object from Floor Able to pick up shoe, needs supervision    From Standing Position, Turn to Look Behind Over each Shoulder Turn sideways only but maintains balance    Turn 360 Degrees Needs close supervision or verbal cueing    Standing Unsupported, Alternately Place Feet on Step/Stool Able to complete >2 steps/needs minimal assist    Standing Unsupported, One Foot in [employer] balance while stepping or standing    Standing on One Leg Tries to lift leg/unable to hold 3 seconds but remains standing independently    Total Score 26    Berg comment: <36 = High risk for falls (close to 100%)      Dynamic Gait Index   Level Surface Mild Impairment    Change in Gait Speed Moderate Impairment    Gait with Horizontal Head Turns Mild Impairment    Gait with Vertical Head Turns Moderate Impairment    Gait and Pivot Turn Moderate Impairment    Step Over Obstacle Mild Impairment    Step Around Obstacles Moderate Impairment    Steps Severe Impairment    Total Score 10    DGI comment: Scores of 19 or less are predictive of falls in older community living adults      THERAPEUTIC EXERCISE: To improve ROM and flexibility.  Demonstration, verbal and tactile cues throughout for technique.  Seated hip hinge HS stretch x 30" bil Seated figure-4 hip hinge piriformis stretch x 30" - patient feeling stretch more in leg than in hip/buttocks Side-sitting figure-4 hip hinge piriformis stretch x 30" bil - better tolerated with stretch noted  in lower buttock Seated lunge position hip flexor stretch x 30" bil   07/12/2023 - Eval   PATIENT EDUCATION:  Education details: PT eval findings and initial HEP  Person educated: Patient Education method: Explanation, Demonstration, Tactile cues, Verbal cues, and Handouts Education comprehension: verbalized understanding, returned demonstration, verbal cues required, tactile cues required, and needs further education  HOME EXERCISE PROGRAM: Access  Code: [email or web address] URL: [email or web address] Date: 07/19/2023 Prepared by: Glenetta Hew  Exercises - Seated Hamstring Stretch  - 1-2 x daily - 7 x weekly - 3 reps - 30 sec hold - Seated Table Piriformis Stretch  - 1-2 x daily - 7 x weekly - 3 reps - 30 sec hold - Seated Hip Flexor Stretch  - 1-2 x daily - 7 x weekly - 3 reps - 30 sec hold   ASSESSMENT:  CLINICAL IMPRESSION: Completed standardized balance testing with Berg score of 26/56 and DGI score of 10/24 both indicating high risk for falls.  Initiated training in HEP for proximal LE flexibility to help improve posture and ease of mobility.  Sayeed noting preference for seated exercises over supine therefore provided training in stretches from sitting position.  Will plan to add strengthening exercises as well as balance training and upcoming visits and update HEP accordingly.  [place] will benefit from skilled PT to address above deficits to improve mobility and activity tolerance to help reach the maximal level of functional independence and mobility. Patient demonstrates understanding of this POC and is in agreement with this plan.   OBJECTIVE IMPAIRMENTS: Abnormal gait, decreased activity tolerance, decreased balance, decreased coordination, decreased endurance, decreased knowledge of use of DME, decreased mobility, difficulty walking, decreased ROM, decreased strength, decreased safety awareness, impaired perceived functional ability, impaired flexibility, impaired sensation, improper body mechanics, and postural dysfunction.   ACTIVITY LIMITATIONS: carrying, lifting, bending, standing, squatting, stairs, transfers, bathing, dressing, reach over head, locomotion level, and caring for others  PARTICIPATION LIMITATIONS: meal prep, cleaning, laundry, driving, shopping, community activity, and yard work  PERSONAL FACTORS: Age, Past/current experiences, Time since onset of injury/illness/exacerbation, and 3+ comorbidities: Chronic  spinal cerebellar ataxia, neuropathy, osteoporosis, R sided pelvic fracture s/p fall 05/2020, MI/CAD s/p CABG x 4, Aflutter, GERD, B shoulder surgery/RCR, L carpal tunnel release, OSA  are also affecting patient's functional outcome.   REHAB POTENTIAL: Good  CLINICAL DECISION MAKING: Unstable/unpredictable  EVALUATION COMPLEXITY: High   GOALS: Goals reviewed with patient? Yes  SHORT TERM GOALS: Target date: 08/09/2023  Patient will be independent with initial HEP to improve outcomes and carryover.  Baseline:  Goal status: IN PROGRESS  2.  Patient will be educated on strategies to decrease risk of falls.  Baseline:  Goal status: IN PROGRESS  3.  Patient will demonstrate decreased 5xSTS time to </= 14.8 sec to decrease risk for falls with transitional mobility. Baseline: 16.47 sec Goal status: IN PROGRESS  LONG TERM GOALS: Target date: 09/06/2023  Patient will be independent with advanced/ongoing HEP to facilitate ability to maintain/progress functional gains from skilled physical therapy services. Baseline:  Goal status: IN PROGRESS  2.  Patient will be able to ambulate 600' with LRAD on variable surfaces with good safety to access community.  Baseline:  Goal status: IN PROGRESS  3.  Patient will be able to step up/down curb safely with LRAD for safety with community ambulation.  Baseline:  Goal status: IN PROGRESS   4.  Patient will demonstrate improved B LE strength to >/= 4+/5 for improved stability and ease of mobility . Baseline:  Refer to above LE MMT table Goal status: IN PROGRESS  5.  Patient will improve TUG time to </= 15 seconds with walking sticks or LRAD for improved efficiency and safety with transfers. Baseline: 20.63 sec with RW Goal status: IN PROGRESS   6.  Patient will demonstrate gait speed of >/= 1.8 ft/sec (0.55 m/s) with walking sticks or LRAD to be a safe limited community ambulator with decreased risk for recurrent falls.  Baseline: 1.00  ft/sec Goal status: IN PROGRESS  7.  Patient will improve Berg score by at least 8 points to improve safety and stability with ADLs in standing and reduce risk for falls. (MCID= 8 points)  Baseline: 26/56 (07/19/23) Goal status: IN PROGRESS  8.  Patient will demonstrate at least 3 point improvement on DGI to improve gait stability and reduce risk for falls. Baseline: 10/24 (07/19/23) Goal status: IN PROGRESS  9. Patient will report >/= 40% on ABC scale to demonstrate improved balance confidence with functional mobility and gait. Baseline: 360 / 1600 = 22.5 % Goal status: IN PROGRESS   PLAN:  PT FREQUENCY: 2x/week  PT DURATION: 8 weeks  PLANNED INTERVENTIONS: [id number]- PT Re-evaluation, 97110-Therapeutic exercises, 97530- Therapeutic activity, 97112- Neuromuscular re-education, 97535- Self Care, [id number]- Manual therapy, [phone]- Gait training, Patient/Family education, Balance training, Stair training, Taping, Dry Needling, DME instructions, Cryotherapy, and Moist heat  PLAN FOR NEXT SESSION: Review initial HEP for proximal LE flexibility; initiate LE strengthening and balance and update HEP as indicated   Marry Guan, PT 07/19/2023, 8:28 PM

## 2023-07-22 ENCOUNTER — Encounter

## 2023-07-26 ENCOUNTER — Ambulatory Visit

## 2023-07-26 DIAGNOSIS — R2681 Unsteadiness on feet: Secondary | ICD-10-CM

## 2023-07-26 DIAGNOSIS — M6281 Muscle weakness (generalized): Secondary | ICD-10-CM

## 2023-07-26 DIAGNOSIS — R260 Ataxic gait: Secondary | ICD-10-CM

## 2023-07-26 NOTE — Therapy (Signed)
 OUTPATIENT PHYSICAL THERAPY TREATMENT   Patient Name: Andre [name] MRN: [id number] DOB:11-Oct-1940, 83 y.o., male Today's Date: 07/26/2023   END OF SESSION:  PT End of Session - 07/26/23 1453     Visit Number 3    Date for PT Re-Evaluation 09/06/23    Authorization Type [employer]    Progress Note Due on Visit 10    PT Start Time 1448    PT Stop Time 1531    PT Time Calculation (min) 43 min    Activity Tolerance Patient tolerated treatment well    Behavior During Therapy WFL for tasks assessed/performed               Past Medical History:  Diagnosis Date   Carpal tunnel syndrome    Cerebellar ataxia (HCC)    spinal cerebellar ataxia   Dyslipidemia    H/O rotator cuff surgery    Obstructive sleep apnea    Past Surgical History:  Procedure Laterality Date   BYPASS GRAFT AORTA TO AORTA     quadruple bypass   CARPAL TUNNEL RELEASE Left    EYE SURGERY Bilateral    SHOULDER SURGERY     bilateral   TONSILLECTOMY     VASECTOMY     Patient Active Problem List   Diagnosis Date Noted   Osteoporosis, senile 08/27/2022   Neuropathy 12/12/2018   Cerebellar ataxia (HCC) 12/12/2018   Vitreous membranes and strands 10/20/2018   Bilateral cold feet 04/07/2017   Osteopenia of multiple sites 04/07/2017   Vitamin D deficiency 04/07/2017   CAD in native artery 03/09/2017   Old MI (myocardial infarction) 03/09/2017   Carpal tunnel syndrome, 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   Ulnar neuropathy at elbow, 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   PVC's (premature ventricular contractions) 10/09/2015   Chronic dryness of both eyes 09/26/2015   Hypermetropia 09/26/2015   Presbyopia 09/26/2015   Regular astigmatism 09/26/2015   Hypermetropia of both eyes 09/26/2015   Senile nuclear sclerosis 09/26/2015   Stationary peripheral pterygium of right eye 09/26/2015   Unspecified esotropia 09/26/2015   Ventral hernia without obstruction or gangrene 07/30/2015   Dyslipidemia 05/09/2015   OSA (obstructive sleep  apnea) 05/09/2015   S/P CABG (coronary artery bypass graft) 05/09/2015   Unspecified atrial flutter (HCC) 05/09/2015    PCP: Loyal Jacobson, MD   REFERRING PROVIDER: Loyal Jacobson, MD   REFERRING DIAG:  R26.81 (ICD-10-CM) - Unsteadiness on feet R26.89 (ICD-10-CM) - Other abnormalities of gait and mobility  THERAPY DIAG:  Unsteadiness on feet  Ataxic gait  Muscle weakness (generalized)  RATIONALE FOR EVALUATION AND TREATMENT: Rehabilitation  ONSET DATE: Chronic  NEXT MD VISIT: 12/16/23   SUBJECTIVE:  SUBJECTIVE STATEMENT: Doing good today.  Eval: Pt reports how he feels depends on how much sleep he gets.  Arrives to PT with RW but states he also has a 4WW and hiking poles which he uses intermixed. His wife feels like he has been more unsteady of late.  He notes some R knee pain a few months back but now resolved.  Works with Systems analyst 2x/wk - STS, dumbbells, planks, etc.  Sees a chiropractor for his back 1x/month.  Pt accompanied by: self  PAIN: Are you having pain? No  PERTINENT HISTORY:  Chronic spinal cerebellar ataxia, neuropathy, osteoporosis, R sided pelvic fracture s/p fall 05/2020, MI/CAD s/p CABG x 4, Aflutter, GERD, B shoulder surgery/RCR, L carpal tunnel release, OSA  PRECAUTIONS: Fall  RED FLAGS: None  WEIGHT BEARING RESTRICTIONS: No  FALLS:  Has patient fallen in last 6 months? No  LIVING ENVIRONMENT: Lives with: lives with their family Lives in: House/apartment Stairs: Yes: Internal: 14 steps; on right going up and on left going up and External: 1 or 3 steps; on right going up and on left going up Has following equipment at home: Environmental consultant - 2 wheeled, Environmental consultant - 4 wheeled, shower chair, Grab bars, and hiking poles  OCCUPATION: Retired  PLOF:  Independent, Independent with community mobility with device, and Leisure: community outings with friends, walking loops in driveway, driving with son (adult CP); works with Systems analyst 2x/wk - STS, dumbbells, planks, etc   PATIENT GOALS: "To feel more confident walking with the walking sticks."   OBJECTIVE: (objective measures completed at initial evaluation unless otherwise dated)  DIAGNOSTIC FINDINGS:  06/22/23 - XR L toes (Left great toe skin breakdown) IMPRESSION:  Mild soft tissue swelling. No radiographic evidence of osteomyelitis.  Sonogram negative for DVT  COGNITION: Overall cognitive status: Within functional limits for tasks assessed   SENSATION: B LE neuropathy - predominantly in B feet  COORDINATION: Gross motor mild impairment B LE  EDEMA:  Intermittent distal LE edema  POSTURE:  No Significant postural limitations  MUSCLE LENGTH: Hamstrings: severe tight B ITB: mod tight L>R Piriformis: mod/severe tight B Hip flexors: mod tight B Quads: mild tight B Heelcord:   LOWER EXTREMITY ROM:    Grossly WFL other than restrictions related to above muscle tightness  LOWER EXTREMITY MMT:    MMT Right eval Left eval  Hip flexion 4+ 4  Hip extension 4 4  Hip abduction 4 4  Hip adduction 4+ 4+  Hip internal rotation 4+ 4+  Hip external rotation 4 4  Knee flexion 5 5  Knee extension 5 5  Ankle dorsiflexion 4+ 4  Ankle plantarflexion    Ankle inversion    Ankle eversion    (Blank rows = not tested)  BED MOBILITY:  Independent  TRANSFERS: Assistive device utilized: Environmental consultant - 2 wheeled  Sit to stand: SBA Stand to sit: SBA Chair to chair:    Floor:     GAIT: Distance walked: clinic distances Assistive device utilized: Environmental consultant - 2 wheeled Level of assistance: Modified independence and SBA Gait pattern: step to pattern, decreased hip/knee flexion- Right, decreased hip/knee flexion- Left, ataxic, and trunk flexed Comments:   RAMP: Level of  Assistance:  NT Assistive device utilized:    Ramp Comments:   CURB:  Level of Assistance:  NT Assistive device utilized:    Curb Comments:   STAIRS:  Level of Assistance:  NT  Stair Negotiation Technique:   Number of Stairs:    Height of Stairs:  Comments:   FUNCTIONAL TESTS:  5 times sit to stand: 16.47 sec w/o UE assist Timed up and go (TUG): 20.63 sec with RW 10 meter walk test: 32.68 sec with RW; 1.00 ft/sec with RW  Berg Balance Scale: 26/56; Scores of <36 indicate high risk for falls (close to 100%) - 07/19/23 Dynamic Gait Index: 10/24; Scores of 19 or less are predictive of falls in older community living adults - 07/19/23 MCTSIB: Condition 1: Avg of 3 trials: 30 sec, Condition 2: Avg of 3 trials: 8.67 sec, Condition 3: Avg of 3 trials: 30 sec, Condition 4: Avg of 3 trials: 4 sec, and Total Score: 72.67/120  PATIENT SURVEYS:  ABC scale 360 / 1600 = 22.5 %; <50% indicates low level of physical functioning and risk for recurrent falls   TODAY'S TREATMENT:  07/26/23 Therapeutic Exercise: to improve strength, ROM, flexibility, and endurance  Bike L2x18min Standing heel raises 2 x 10 Standing hip abduction 2 x 10  Standing hip extension 2 x 10 - cues for posture Standing marching 2 x 10 Seated hamstring stretch x 30" bil Seated table piriformis stretch x 30" bil NEUROMUSCULAR RE-EDUCATION: To improve proprioception, balance, and kinesthesia. Standing balance with head turns 2x10 Standing balance with trunk rotation 2x10 Standing balance with narrow BOS 2x for 15 seconds Semi tandem stance bil for 15"  07/19/2023  PHYSICAL PERFORMANCE TEST or MEASUREMENT: Berg = 26/56; Scores of <36 indicate high risk for falls (close to 100%) DGI = 10/24; Scores of 19 or less are predictive of falls in older community living adults   [employer]   Sit to Stand Needs minimal aid to stand or to stabilize    Standing Unsupported Able to stand 2 minutes with supervision    Sitting  with Back Unsupported but Feet Supported on Floor or Stool Able to sit safely and securely 2 minutes    Stand to Sit Uses backs of legs against chair to control descent    Transfers Able to transfer with verbal cueing and /or supervision    Standing Unsupported with Eyes Closed Able to stand 3 seconds    Standing Unsupported with Feet Together Needs help to attain position but able to stand for 30 seconds with feet together    From Standing, Reach Forward with Outstretched Arm Can reach forward >12 cm safely (5")    From Standing Position, Pick up Object from Floor Able to pick up shoe, needs supervision    From Standing Position, Turn to Look Behind Over each Shoulder Turn sideways only but maintains balance    Turn 360 Degrees Needs close supervision or verbal cueing    Standing Unsupported, Alternately Place Feet on Step/Stool Able to complete >2 steps/needs minimal assist    Standing Unsupported, One Foot in [employer] balance while stepping or standing    Standing on One Leg Tries to lift leg/unable to hold 3 seconds but remains standing independently    Total Score 26    Berg comment: <36 = High risk for falls (close to 100%)      Dynamic Gait Index   Level Surface Mild Impairment    Change in Gait Speed Moderate Impairment    Gait with Horizontal Head Turns Mild Impairment    Gait with Vertical Head Turns Moderate Impairment    Gait and Pivot Turn Moderate Impairment    Step Over Obstacle Mild Impairment    Step Around Obstacles Moderate Impairment    Steps Severe Impairment  Total Score 10    DGI comment: Scores of 19 or less are predictive of falls in older community living adults      THERAPEUTIC EXERCISE: To improve ROM and flexibility.  Demonstration, verbal and tactile cues throughout for technique.  Seated hip hinge HS stretch x 30" bil Seated figure-4 hip hinge piriformis stretch x 30" - patient feeling stretch more in leg than in hip/buttocks Side-sitting figure-4  hip hinge piriformis stretch x 30" bil - better tolerated with stretch noted in lower buttock Seated lunge position hip flexor stretch x 30" bil   07/12/2023 - Eval   PATIENT EDUCATION:  Education details: PT eval findings and initial HEP  Person educated: Patient Education method: Explanation, Demonstration, Tactile cues, Verbal cues, and Handouts Education comprehension: verbalized understanding, returned demonstration, verbal cues required, tactile cues required, and needs further education  HOME EXERCISE PROGRAM: Access Code: [email or web address] URL: [email or web address] Date: 07/26/2023 Prepared by: Verta Ellen  Exercises - Seated Hamstring Stretch  - 1-2 x daily - 7 x weekly - 3 reps - 30 sec hold - Seated Table Piriformis Stretch  - 1-2 x daily - 7 x weekly - 3 reps - 30 sec hold - Seated Hip Flexor Stretch  - 1-2 x daily - 7 x weekly - 3 reps - 30 sec hold - Heel Raises with Counter Support  - 1 x daily - 7 x weekly - 3 sets - 10 reps - Standing Hip Abduction with Counter Support  - 1 x daily - 7 x weekly - 3 sets - 10 reps - Standing Hip Extension with Counter Support  - 1 x daily - 7 x weekly - 3 sets - 10 reps - Standing March with Counter Support  - 1 x daily - 7 x weekly - 3 sets - 10 reps   ASSESSMENT:  CLINICAL IMPRESSION: Therapeutic exercises focused on proximal LE strengthening and flexibility. NMR focused on static balance to improve stability. Pt showing some difficulty with narrow BOS standing. He requested to review HEP, which he showed a good demo of his exercises. He overall responded well but does need continual work on improving his static balance to reduce risk for falls. Javante will benefit from skilled PT to address above deficits to improve mobility and activity tolerance to help reach the maximal level of functional independence and mobility. Patient demonstrates understanding of this POC and is in agreement with this plan.   OBJECTIVE  IMPAIRMENTS: Abnormal gait, decreased activity tolerance, decreased balance, decreased coordination, decreased endurance, decreased knowledge of use of DME, decreased mobility, difficulty walking, decreased ROM, decreased strength, decreased safety awareness, impaired perceived functional ability, impaired flexibility, impaired sensation, improper body mechanics, and postural dysfunction.   ACTIVITY LIMITATIONS: carrying, lifting, bending, standing, squatting, stairs, transfers, bathing, dressing, reach over head, locomotion level, and caring for others  PARTICIPATION LIMITATIONS: meal prep, cleaning, laundry, driving, shopping, community activity, and yard work  PERSONAL FACTORS: Age, Past/current experiences, Time since onset of injury/illness/exacerbation, and 3+ comorbidities: Chronic spinal cerebellar ataxia, neuropathy, osteoporosis, R sided pelvic fracture s/p fall 05/2020, MI/CAD s/p CABG x 4, Aflutter, GERD, B shoulder surgery/RCR, L carpal tunnel release, OSA  are also affecting patient's functional outcome.   REHAB POTENTIAL: Good  CLINICAL DECISION MAKING: Unstable/unpredictable  EVALUATION COMPLEXITY: High   GOALS: Goals reviewed with patient? Yes  SHORT TERM GOALS: Target date: 08/09/2023  Patient will be independent with initial HEP to improve outcomes and carryover.  Baseline:  Goal status: IN PROGRESS  2.  Patient  will be educated on strategies to decrease risk of falls.  Baseline:  Goal status: IN PROGRESS  3.  Patient will demonstrate decreased 5xSTS time to </= 14.8 sec to decrease risk for falls with transitional mobility. Baseline: 16.47 sec Goal status: IN PROGRESS  LONG TERM GOALS: Target date: 09/06/2023  Patient will be independent with advanced/ongoing HEP to facilitate ability to maintain/progress functional gains from skilled physical therapy services. Baseline:  Goal status: IN PROGRESS  2.  Patient will be able to ambulate 600' with LRAD on variable  surfaces with good safety to access community.  Baseline:  Goal status: IN PROGRESS  3.  Patient will be able to step up/down curb safely with LRAD for safety with community ambulation.  Baseline:  Goal status: IN PROGRESS   4.  Patient will demonstrate improved B LE strength to >/= 4+/5 for improved stability and ease of mobility . Baseline: Refer to above LE MMT table Goal status: IN PROGRESS  5.  Patient will improve TUG time to </= 15 seconds with walking sticks or LRAD for improved efficiency and safety with transfers. Baseline: 20.63 sec with RW Goal status: IN PROGRESS   6.  Patient will demonstrate gait speed of >/= 1.8 ft/sec (0.55 m/s) with walking sticks or LRAD to be a safe limited community ambulator with decreased risk for recurrent falls.  Baseline: 1.00 ft/sec Goal status: IN PROGRESS  7.  Patient will improve Berg score by at least 8 points to improve safety and stability with ADLs in standing and reduce risk for falls. (MCID= 8 points)  Baseline: 26/56 (07/19/23) Goal status: IN PROGRESS  8.  Patient will demonstrate at least 3 point improvement on DGI to improve gait stability and reduce risk for falls. Baseline: 10/24 (07/19/23) Goal status: IN PROGRESS  9. Patient will report >/= 40% on ABC scale to demonstrate improved balance confidence with functional mobility and gait. Baseline: 360 / 1600 = 22.5 % Goal status: IN PROGRESS   PLAN:  PT FREQUENCY: 2x/week  PT DURATION: 8 weeks  PLANNED INTERVENTIONS: 97164- PT Re-evaluation, 97110-Therapeutic exercises, 97530- Therapeutic activity, 97112- Neuromuscular re-education, 97535- Self Care, 16109- Manual therapy, [phone]- Gait training, Patient/Family education, Balance training, Stair training, Taping, Dry Needling, DME instructions, Cryotherapy, and Moist heat  PLAN FOR NEXT SESSION: Review progressed HEP for proximal LE strengthening if needed; LE strengthening and static balance and update HEP as  indicated   Darleene Cleaver, PTA 07/26/2023, 3:45 PM

## 2023-07-27 ENCOUNTER — Encounter: Admitting: Physical Therapy

## 2023-07-29 ENCOUNTER — Ambulatory Visit: Attending: Family Medicine

## 2023-07-29 DIAGNOSIS — M6281 Muscle weakness (generalized): Secondary | ICD-10-CM | POA: Diagnosis present

## 2023-07-29 DIAGNOSIS — R260 Ataxic gait: Secondary | ICD-10-CM | POA: Diagnosis present

## 2023-07-29 DIAGNOSIS — R2681 Unsteadiness on feet: Secondary | ICD-10-CM | POA: Diagnosis present

## 2023-07-29 NOTE — Therapy (Signed)
 OUTPATIENT PHYSICAL THERAPY TREATMENT   Patient Name: Andre [name] MRN: [id number] DOB:1941-03-07, 83 y.o., male Today's Date: 07/29/2023   END OF SESSION:  PT End of Session - 07/29/23 1446     Visit Number 4    Date for PT Re-Evaluation 09/06/23    Authorization Type [employer]    Progress Note Due on Visit 10    PT Start Time 1401    PT Stop Time 1444    PT Time Calculation (min) 43 min    Activity Tolerance Patient tolerated treatment well    Behavior During Therapy WFL for tasks assessed/performed                Past Medical History:  Diagnosis Date   Carpal tunnel syndrome    Cerebellar ataxia (HCC)    spinal cerebellar ataxia   Dyslipidemia    H/O rotator cuff surgery    Obstructive sleep apnea    Past Surgical History:  Procedure Laterality Date   BYPASS GRAFT AORTA TO AORTA     quadruple bypass   CARPAL TUNNEL RELEASE Left    EYE SURGERY Bilateral    SHOULDER SURGERY     bilateral   TONSILLECTOMY     VASECTOMY     Patient Active Problem List   Diagnosis Date Noted   Osteoporosis, senile 08/27/2022   Neuropathy 12/12/2018   Cerebellar ataxia (HCC) 12/12/2018   Vitreous membranes and strands 10/20/2018   Bilateral cold feet 04/07/2017   Osteopenia of multiple sites 04/07/2017   Vitamin D deficiency 04/07/2017   CAD in native artery 03/09/2017   Old MI (myocardial infarction) 03/09/2017   Carpal tunnel syndrome, 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   Ulnar neuropathy at elbow, 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   PVC's (premature ventricular contractions) 10/09/2015   Chronic dryness of both eyes 09/26/2015   Hypermetropia 09/26/2015   Presbyopia 09/26/2015   Regular astigmatism 09/26/2015   Hypermetropia of both eyes 09/26/2015   Senile nuclear sclerosis 09/26/2015   Stationary peripheral pterygium of right eye 09/26/2015   Unspecified esotropia 09/26/2015   Ventral hernia without obstruction or gangrene 07/30/2015   Dyslipidemia 05/09/2015   OSA (obstructive sleep  apnea) 05/09/2015   S/P CABG (coronary artery bypass graft) 05/09/2015   Unspecified atrial flutter (HCC) 05/09/2015    PCP: Loyal Jacobson, MD   REFERRING PROVIDER: Loyal Jacobson, MD   REFERRING DIAG:  R26.81 (ICD-10-CM) - Unsteadiness on feet R26.89 (ICD-10-CM) - Other abnormalities of gait and mobility  THERAPY DIAG:  Unsteadiness on feet  Ataxic gait  Muscle weakness (generalized)  RATIONALE FOR EVALUATION AND TREATMENT: Rehabilitation  ONSET DATE: Chronic  NEXT MD VISIT: 12/16/23   SUBJECTIVE:  SUBJECTIVE STATEMENT: Pt reports he is doing well.  Eval: Pt reports how he feels depends on how much sleep he gets.  Arrives to PT with RW but states he also has a 4WW and hiking poles which he uses intermixed. His wife feels like he has been more unsteady of late.  He notes some R knee pain a few months back but now resolved.  Works with Systems analyst 2x/wk - STS, dumbbells, planks, etc.  Sees a chiropractor for his back 1x/month.  Pt accompanied by: self  PAIN: Are you having pain? No  PERTINENT HISTORY:  Chronic spinal cerebellar ataxia, neuropathy, osteoporosis, R sided pelvic fracture s/p fall 05/2020, MI/CAD s/p CABG x 4, Aflutter, GERD, B shoulder surgery/RCR, L carpal tunnel release, OSA  PRECAUTIONS: Fall  RED FLAGS: None  WEIGHT BEARING RESTRICTIONS: No  FALLS:  Has patient fallen in last 6 months? No  LIVING ENVIRONMENT: Lives with: lives with their family Lives in: House/apartment Stairs: Yes: Internal: 14 steps; on right going up and on left going up and External: 1 or 3 steps; on right going up and on left going up Has following equipment at home: Environmental consultant - 2 wheeled, Environmental consultant - 4 wheeled, shower chair, Grab bars, and hiking poles  OCCUPATION:  Retired  PLOF: Independent, Independent with community mobility with device, and Leisure: community outings with friends, walking loops in driveway, driving with son (adult CP); works with Systems analyst 2x/wk - STS, dumbbells, planks, etc   PATIENT GOALS: "To feel more confident walking with the walking sticks."   OBJECTIVE: (objective measures completed at initial evaluation unless otherwise dated)  DIAGNOSTIC FINDINGS:  06/22/23 - XR L toes (Left great toe skin breakdown) IMPRESSION:  Mild soft tissue swelling. No radiographic evidence of osteomyelitis.  Sonogram negative for DVT  COGNITION: Overall cognitive status: Within functional limits for tasks assessed   SENSATION: B LE neuropathy - predominantly in B feet  COORDINATION: Gross motor mild impairment B LE  EDEMA:  Intermittent distal LE edema  POSTURE:  No Significant postural limitations  MUSCLE LENGTH: Hamstrings: severe tight B ITB: mod tight L>R Piriformis: mod/severe tight B Hip flexors: mod tight B Quads: mild tight B Heelcord:   LOWER EXTREMITY ROM:    Grossly WFL other than restrictions related to above muscle tightness  LOWER EXTREMITY MMT:    MMT Right eval Left eval  Hip flexion 4+ 4  Hip extension 4 4  Hip abduction 4 4  Hip adduction 4+ 4+  Hip internal rotation 4+ 4+  Hip external rotation 4 4  Knee flexion 5 5  Knee extension 5 5  Ankle dorsiflexion 4+ 4  Ankle plantarflexion    Ankle inversion    Ankle eversion    (Blank rows = not tested)  BED MOBILITY:  Independent  TRANSFERS: Assistive device utilized: Environmental consultant - 2 wheeled  Sit to stand: SBA Stand to sit: SBA Chair to chair:    Floor:     GAIT: Distance walked: clinic distances Assistive device utilized: Environmental consultant - 2 wheeled Level of assistance: Modified independence and SBA Gait pattern: step to pattern, decreased hip/knee flexion- Right, decreased hip/knee flexion- Left, ataxic, and trunk flexed Comments:    RAMP: Level of Assistance:  NT Assistive device utilized:    Ramp Comments:   CURB:  Level of Assistance:  NT Assistive device utilized:    Curb Comments:   STAIRS:  Level of Assistance:  NT  Stair Negotiation Technique:   Number of Stairs:  Height of Stairs:   Comments:   FUNCTIONAL TESTS:  5 times sit to stand: 16.47 sec w/o UE assist Timed up and go (TUG): 20.63 sec with RW 10 meter walk test: 32.68 sec with RW; 1.00 ft/sec with RW  Berg Balance Scale: 26/56; Scores of <36 indicate high risk for falls (close to 100%) - 07/19/23 Dynamic Gait Index: 10/24; Scores of 19 or less are predictive of falls in older community living adults - 07/19/23 MCTSIB: Condition 1: Avg of 3 trials: 30 sec, Condition 2: Avg of 3 trials: 8.67 sec, Condition 3: Avg of 3 trials: 30 sec, Condition 4: Avg of 3 trials: 4 sec, and Total Score: 72.67/120  PATIENT SURVEYS:  ABC scale 360 / 1600 = 22.5 %; <50% indicates low level of physical functioning and risk for recurrent falls   TODAY'S TREATMENT:  07/29/23 Therapeutic Exercise: to improve strength, ROM, flexibility, and endurance  Nustep L6x72min UE/LE BATCA leg curls 35lb 2x10 BLE BATCA leg extension 25lb 2x10 BLE  NEUROMUSCULAR RE-EDUCATION: To improve proprioception, balance, and kinesthesia. Standing balance with head turns 2x10 Standing balance with trunk rotation 2x10 Standing balance with narrow BOS x 45 seconds Semi tandem stance bil for 2x15" Forward reach bil x 10  Heel raises 2x10 Squats with intermittent UE support 2x10 Narrow BOS with horizontal head turns x 10  07/26/23 Therapeutic Exercise: to improve strength, ROM, flexibility, and endurance  Bike L2x43min Standing heel raises 2 x 10 Standing hip abduction 2 x 10  Standing hip extension 2 x 10 - cues for posture Standing marching 2 x 10 Seated hamstring stretch x 30" bil Seated table piriformis stretch x 30" bil NEUROMUSCULAR RE-EDUCATION: To improve proprioception,  balance, and kinesthesia. Standing balance with head turns 2x10 Standing balance with trunk rotation 2x10 Standing balance with narrow BOS 2x for 15 seconds Semi tandem stance bil for 15"  07/19/2023  PHYSICAL PERFORMANCE TEST or MEASUREMENT: Berg = 26/56; Scores of <36 indicate high risk for falls (close to 100%) DGI = 10/24; Scores of 19 or less are predictive of falls in older community living adults   [employer]   Sit to Stand Needs minimal aid to stand or to stabilize    Standing Unsupported Able to stand 2 minutes with supervision    Sitting with Back Unsupported but Feet Supported on Floor or Stool Able to sit safely and securely 2 minutes    Stand to Sit Uses backs of legs against chair to control descent    Transfers Able to transfer with verbal cueing and /or supervision    Standing Unsupported with Eyes Closed Able to stand 3 seconds    Standing Unsupported with Feet Together Needs help to attain position but able to stand for 30 seconds with feet together    From Standing, Reach Forward with Outstretched Arm Can reach forward >12 cm safely (5")    From Standing Position, Pick up Object from Floor Able to pick up shoe, needs supervision    From Standing Position, Turn to Look Behind Over each Shoulder Turn sideways only but maintains balance    Turn 360 Degrees Needs close supervision or verbal cueing    Standing Unsupported, Alternately Place Feet on Step/Stool Able to complete >2 steps/needs minimal assist    Standing Unsupported, One Foot in [employer] balance while stepping or standing    Standing on One Leg Tries to lift leg/unable to hold 3 seconds but remains standing independently    Total Score 26  Sharlene Motts comment: <36 = High risk for falls (close to 100%)      Dynamic Gait Index   Level Surface Mild Impairment    Change in Gait Speed Moderate Impairment    Gait with Horizontal Head Turns Mild Impairment    Gait with Vertical Head Turns Moderate Impairment     Gait and Pivot Turn Moderate Impairment    Step Over Obstacle Mild Impairment    Step Around Obstacles Moderate Impairment    Steps Severe Impairment    Total Score 10    DGI comment: Scores of 19 or less are predictive of falls in older community living adults      THERAPEUTIC EXERCISE: To improve ROM and flexibility.  Demonstration, verbal and tactile cues throughout for technique.  Seated hip hinge HS stretch x 30" bil Seated figure-4 hip hinge piriformis stretch x 30" - patient feeling stretch more in leg than in hip/buttocks Side-sitting figure-4 hip hinge piriformis stretch x 30" bil - better tolerated with stretch noted in lower buttock Seated lunge position hip flexor stretch x 30" bil   07/12/2023 - Eval   PATIENT EDUCATION:  Education details: PT eval findings and initial HEP  Person educated: Patient Education method: Explanation, Demonstration, Tactile cues, Verbal cues, and Handouts Education comprehension: verbalized understanding, returned demonstration, verbal cues required, tactile cues required, and needs further education  HOME EXERCISE PROGRAM: Access Code: [email or web address] URL: [email or web address] Date: 07/26/2023 Prepared by: Verta Ellen  Exercises - Seated Hamstring Stretch  - 1-2 x daily - 7 x weekly - 3 reps - 30 sec hold - Seated Table Piriformis Stretch  - 1-2 x daily - 7 x weekly - 3 reps - 30 sec hold - Seated Hip Flexor Stretch  - 1-2 x daily - 7 x weekly - 3 reps - 30 sec hold - Heel Raises with Counter Support  - 1 x daily - 7 x weekly - 3 sets - 10 reps - Standing Hip Abduction with Counter Support  - 1 x daily - 7 x weekly - 3 sets - 10 reps - Standing Hip Extension with Counter Support  - 1 x daily - 7 x weekly - 3 sets - 10 reps - Standing March with Counter Support  - 1 x daily - 7 x weekly - 3 sets - 10 reps   ASSESSMENT:  CLINICAL IMPRESSION: Focused primarily on NMR static balance activities as patient is still showing  difficulty with stability in these positions. He definitely shows more difficulty with multitasking with static balance. Close guarding and assistance required with balance activities for safety. Machine strengthening done for bulk strengthening of both LE.  Marlan will benefit from skilled PT to address above deficits to improve mobility and activity tolerance to help reach the maximal level of functional independence and mobility. Patient demonstrates understanding of this POC and is in agreement with this plan.   OBJECTIVE IMPAIRMENTS: Abnormal gait, decreased activity tolerance, decreased balance, decreased coordination, decreased endurance, decreased knowledge of use of DME, decreased mobility, difficulty walking, decreased ROM, decreased strength, decreased safety awareness, impaired perceived functional ability, impaired flexibility, impaired sensation, improper body mechanics, and postural dysfunction.   ACTIVITY LIMITATIONS: carrying, lifting, bending, standing, squatting, stairs, transfers, bathing, dressing, reach over head, locomotion level, and caring for others  PARTICIPATION LIMITATIONS: meal prep, cleaning, laundry, driving, shopping, community activity, and yard work  PERSONAL FACTORS: Age, Past/current experiences, Time since onset of injury/illness/exacerbation, and 3+ comorbidities: Chronic spinal cerebellar ataxia, neuropathy, osteoporosis, R sided pelvic fracture  s/p fall 05/2020, MI/CAD s/p CABG x 4, Aflutter, GERD, B shoulder surgery/RCR, L carpal tunnel release, OSA  are also affecting patient's functional outcome.   REHAB POTENTIAL: Good  CLINICAL DECISION MAKING: Unstable/unpredictable  EVALUATION COMPLEXITY: High   GOALS: Goals reviewed with patient? Yes  SHORT TERM GOALS: Target date: 08/09/2023  Patient will be independent with initial HEP to improve outcomes and carryover.  Baseline:  Goal status: IN PROGRESS  2.  Patient will be educated on strategies to  decrease risk of falls.  Baseline:  Goal status: IN PROGRESS  3.  Patient will demonstrate decreased 5xSTS time to </= 14.8 sec to decrease risk for falls with transitional mobility. Baseline: 16.47 sec Goal status: IN PROGRESS  LONG TERM GOALS: Target date: 09/06/2023  Patient will be independent with advanced/ongoing HEP to facilitate ability to maintain/progress functional gains from skilled physical therapy services. Baseline:  Goal status: IN PROGRESS  2.  Patient will be able to ambulate 600' with LRAD on variable surfaces with good safety to access community.  Baseline:  Goal status: IN PROGRESS  3.  Patient will be able to step up/down curb safely with LRAD for safety with community ambulation.  Baseline:  Goal status: IN PROGRESS   4.  Patient will demonstrate improved B LE strength to >/= 4+/5 for improved stability and ease of mobility . Baseline: Refer to above LE MMT table Goal status: IN PROGRESS  5.  Patient will improve TUG time to </= 15 seconds with walking sticks or LRAD for improved efficiency and safety with transfers. Baseline: 20.63 sec with RW Goal status: IN PROGRESS   6.  Patient will demonstrate gait speed of >/= 1.8 ft/sec (0.55 m/s) with walking sticks or LRAD to be a safe limited community ambulator with decreased risk for recurrent falls.  Baseline: 1.00 ft/sec Goal status: IN PROGRESS  7.  Patient will improve Berg score by at least 8 points to improve safety and stability with ADLs in standing and reduce risk for falls. (MCID= 8 points)  Baseline: 26/56 (07/19/23) Goal status: IN PROGRESS  8.  Patient will demonstrate at least 3 point improvement on DGI to improve gait stability and reduce risk for falls. Baseline: 10/24 (07/19/23) Goal status: IN PROGRESS  9. Patient will report >/= 40% on ABC scale to demonstrate improved balance confidence with functional mobility and gait. Baseline: 360 / 1600 = 22.5 % Goal status: IN  PROGRESS   PLAN:  PT FREQUENCY: 2x/week  PT DURATION: 8 weeks  PLANNED INTERVENTIONS: 97164- PT Re-evaluation, 97110-Therapeutic exercises, 97530- Therapeutic activity, 97112- Neuromuscular re-education, 97535- Self Care, [id number]- Manual therapy, [phone]- Gait training, Patient/Family education, Balance training, Stair training, Taping, Dry Needling, DME instructions, Cryotherapy, and Moist heat  PLAN FOR NEXT SESSION: LE strengthening and static balance and update HEP as indicated   Darleene Cleaver, PTA 07/29/2023, 2:54 PM

## 2023-08-03 ENCOUNTER — Encounter: Payer: Self-pay | Admitting: Physical Therapy

## 2023-08-03 ENCOUNTER — Ambulatory Visit: Admitting: Physical Therapy

## 2023-08-03 DIAGNOSIS — R2681 Unsteadiness on feet: Secondary | ICD-10-CM

## 2023-08-03 DIAGNOSIS — R260 Ataxic gait: Secondary | ICD-10-CM

## 2023-08-03 DIAGNOSIS — M6281 Muscle weakness (generalized): Secondary | ICD-10-CM

## 2023-08-03 NOTE — Therapy (Signed)
 OUTPATIENT PHYSICAL THERAPY TREATMENT   Patient Name: Andre [name] MRN: [id number] DOB:1940-10-26, 83 y.o., male Today's Date: 08/03/2023   END OF SESSION:  PT End of Session - 08/03/23 1316     Visit Number 5    Date for PT Re-Evaluation 09/06/23    Authorization Type [employer]    Progress Note Due on Visit 10    PT Start Time 1315    PT Stop Time 1402    PT Time Calculation (min) 47 min    Activity Tolerance Patient tolerated treatment well;Patient limited by fatigue    Behavior During Therapy [facility] for tasks assessed/performed                 Past Medical History:  Diagnosis Date   Carpal tunnel syndrome    Cerebellar ataxia (HCC)    spinal cerebellar ataxia   Dyslipidemia    H/O rotator cuff surgery    Obstructive sleep apnea    Past Surgical History:  Procedure Laterality Date   BYPASS GRAFT AORTA TO AORTA     quadruple bypass   CARPAL TUNNEL RELEASE Left    EYE SURGERY Bilateral    SHOULDER SURGERY     bilateral   TONSILLECTOMY     VASECTOMY     Patient Active Problem List   Diagnosis Date Noted   Osteoporosis, senile 08/27/2022   Neuropathy 12/12/2018   Cerebellar ataxia (HCC) 12/12/2018   Vitreous membranes and strands 10/20/2018   Bilateral cold feet 04/07/2017   Osteopenia of multiple sites 04/07/2017   Vitamin D deficiency 04/07/2017   CAD in native artery 03/09/2017   Old MI (myocardial infarction) 03/09/2017   Carpal tunnel syndrome, 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   Ulnar neuropathy at elbow, 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   PVC's (premature ventricular contractions) 10/09/2015   Chronic dryness of both eyes 09/26/2015   Hypermetropia 09/26/2015   Presbyopia 09/26/2015   Regular astigmatism 09/26/2015   Hypermetropia of both eyes 09/26/2015   Senile nuclear sclerosis 09/26/2015   Stationary peripheral pterygium of right eye 09/26/2015   Unspecified esotropia 09/26/2015   Ventral hernia without obstruction or gangrene 07/30/2015   Dyslipidemia  05/09/2015   OSA (obstructive sleep apnea) 05/09/2015   S/P CABG (coronary artery bypass graft) 05/09/2015   Unspecified atrial flutter (HCC) 05/09/2015    PCP: Loyal Jacobson, MD   REFERRING PROVIDER: Loyal Jacobson, MD   REFERRING DIAG:  R26.81 (ICD-10-CM) - Unsteadiness on feet R26.89 (ICD-10-CM) - Other abnormalities of gait and mobility  THERAPY DIAG:  Unsteadiness on feet  Ataxic gait  Muscle weakness (generalized)  RATIONALE FOR EVALUATION AND TREATMENT: Rehabilitation  ONSET DATE: Chronic  NEXT MD VISIT: 12/16/23   SUBJECTIVE:  SUBJECTIVE STATEMENT: Deagan says that he is doing well and is reporting no pain. Performing HEP every other day and feels that he is ready to progress some exercises.  Eval: Pt reports how he feels depends on how much sleep he gets.  Arrives to PT with RW but states he also has a 4WW and hiking poles which he uses intermixed. His wife feels like he has been more unsteady of late.  He notes some R knee pain a few months back but now resolved.  Works with Systems analyst 2x/wk - STS, dumbbells, planks, etc.  Sees a chiropractor for his back 1x/month.  Pt accompanied by: self  PAIN: Are you having pain? No  PERTINENT HISTORY:  Chronic spinal cerebellar ataxia, neuropathy, osteoporosis, R sided pelvic fracture s/p fall 05/2020, MI/CAD s/p CABG x 4, Aflutter, GERD, B shoulder surgery/RCR, L carpal tunnel release, OSA  PRECAUTIONS: Fall  RED FLAGS: None  WEIGHT BEARING RESTRICTIONS: No  FALLS:  Has patient fallen in last 6 months? No  LIVING ENVIRONMENT: Lives with: lives with their family Lives in: House/apartment Stairs: Yes: Internal: 14 steps; on right going up and on left going up and External: 1 or 3 steps; on right going up and on  left going up Has following equipment at home: Environmental consultant - 2 wheeled, Environmental consultant - 4 wheeled, shower chair, Grab bars, and hiking poles  OCCUPATION: Retired  PLOF: Independent, Independent with community mobility with device, and Leisure: community outings with friends, walking loops in driveway, driving with son (adult CP); works with Systems analyst 2x/wk - STS, dumbbells, planks, etc   PATIENT GOALS: "To feel more confident walking with the walking sticks."   OBJECTIVE: (objective measures completed at initial evaluation unless otherwise dated)  DIAGNOSTIC FINDINGS:  06/22/23 - XR L toes (Left great toe skin breakdown) IMPRESSION:  Mild soft tissue swelling. No radiographic evidence of osteomyelitis.  Sonogram negative for DVT  COGNITION: Overall cognitive status: Within functional limits for tasks assessed   SENSATION: B LE neuropathy - predominantly in B feet  COORDINATION: Gross motor mild impairment B LE  EDEMA:  Intermittent distal LE edema  POSTURE:  No Significant postural limitations  MUSCLE LENGTH: Hamstrings: severe tight B ITB: mod tight L>R Piriformis: mod/severe tight B Hip flexors: mod tight B Quads: mild tight B Heelcord:   LOWER EXTREMITY ROM:    Grossly WFL other than restrictions related to above muscle tightness  LOWER EXTREMITY MMT:    MMT Right eval Left eval  Hip flexion 4+ 4  Hip extension 4 4  Hip abduction 4 4  Hip adduction 4+ 4+  Hip internal rotation 4+ 4+  Hip external rotation 4 4  Knee flexion 5 5  Knee extension 5 5  Ankle dorsiflexion 4+ 4  Ankle plantarflexion    Ankle inversion    Ankle eversion    (Blank rows = not tested)  BED MOBILITY:  Independent  TRANSFERS: Assistive device utilized: Environmental consultant - 2 wheeled  Sit to stand: SBA Stand to sit: SBA Chair to chair:    Floor:     GAIT: Distance walked: clinic distances Assistive device utilized: Environmental consultant - 2 wheeled Level of assistance: Modified independence and SBA Gait  pattern: step to pattern, decreased hip/knee flexion- Right, decreased hip/knee flexion- Left, ataxic, and trunk flexed Comments:   RAMP: Level of Assistance:  NT Assistive device utilized:    Ramp Comments:   CURB:  Level of Assistance:  NT Assistive device utilized:    Curb Comments:  STAIRS:  Level of Assistance:  NT  Stair Negotiation Technique:   Number of Stairs:    Height of Stairs:   Comments:   FUNCTIONAL TESTS:  5 times sit to stand: 16.47 sec w/o UE assist Timed up and go (TUG): 20.63 sec with RW 10 meter walk test: 32.68 sec with RW; 1.00 ft/sec with RW  Berg Balance Scale: 26/56; Scores of <36 indicate high risk for falls (close to 100%) - 07/19/23 Dynamic Gait Index: 10/24; Scores of 19 or less are predictive of falls in older community living adults - 07/19/23 MCTSIB: Condition 1: Avg of 3 trials: 30 sec, Condition 2: Avg of 3 trials: 8.67 sec, Condition 3: Avg of 3 trials: 30 sec, Condition 4: Avg of 3 trials: 4 sec, and Total Score: 72.67/120  PATIENT SURVEYS:  ABC scale 360 / 1600 = 22.5 %; <50% indicates low level of physical functioning and risk for recurrent falls   TODAY'S TREATMENT:   08/03/23 THERAPEUTIC EXERCISE: To improve strength, endurance, ROM, and flexibility.  Demonstration, verbal and tactile cues throughout for technique. NuStep L6 x UE/LE  SELF CARE: Provided education to promote safe home environment.  Reviewed the "Check for Safety - Home Fall Prevention Checklist for Older Adults" to help identify fall risk hazards in the home along with strategies to reduce fall risk at home   NEUROMUSCULAR RE-EDUCATION: To improve balance, coordination, kinesthesia, and proprioception. Corner balance progression with narrow BOS on firm surface with arms at sides and arms crossed on chest            Eyes open: static stance x 30 sec each with arms at sides and arms crossed on chest horiz head turns x 5 vertical head nods x 5 trunk rotation x  5 Eyes closed: static stance x 15 sec - arms at side - did w/o leaning on wall or PT Corner balance progression with partial tandem on firm surface with arms at sides and arms crossed on chest            Eyes open: static balance x 30 sec each with arms at sides and arms crossed on chest horiz head turns x 5 vertical head nods x 5 trunk rotation x 5 Standing Hip ABD w/ 2lb weights around ankle in RW, chair behind pt - found too easy Standing Hip ABD, EXT & marching w/ 3lb weights around ankle in RW - PT provide close guarding; more challenging & appropriate x 10 B  07/29/23 Therapeutic Exercise: to improve strength, ROM, flexibility, and endurance  Nustep L6x81min UE/LE BATCA leg curls 35lb 2x10 BLE BATCA leg extension 25lb 2x10 BLE  NEUROMUSCULAR RE-EDUCATION: To improve proprioception, balance, and kinesthesia. Standing balance with head turns 2x10 Standing balance with trunk rotation 2x10 Standing balance with narrow BOS x 45 seconds Semi tandem stance bil for 2x15" Forward reach bil x 10  Heel raises 2x10 Squats with intermittent UE support 2x10 Narrow BOS with horizontal head turns x 10  07/26/23 Therapeutic Exercise: to improve strength, ROM, flexibility, and endurance  Bike L2x34min Standing heel raises 2 x 10 Standing hip abduction 2 x 10  Standing hip extension 2 x 10 - cues for posture Standing marching 2 x 10 Seated hamstring stretch x 30" bil Seated table piriformis stretch x 30" bil NEUROMUSCULAR RE-EDUCATION: To improve proprioception, balance, and kinesthesia. Standing balance with head turns 2x10 Standing balance with trunk rotation 2x10 Standing balance with narrow BOS 2x for 15 seconds Semi tandem stance bil for 15"  PATIENT EDUCATION:  Education details: HEP review, HEP progression - pt instructed to add 3lb ankle weights for exercises, continue with current HEP, and Home Fall Prevention Checklist   Person educated: Patient Education method: Explanation,  Demonstration, Tactile cues, Verbal cues, and Handouts Education comprehension: verbalized understanding, returned demonstration, verbal cues required, tactile cues required, and needs further education  HOME EXERCISE PROGRAM: Access Code: [email or web address] URL: [email or web address] Date: 08/03/2023 Prepared by: Nijae Doyel Joseph-Greene  Exercises - Seated Hamstring Stretch  - 1-2 x daily - 7 x weekly - 3 reps - 30 sec hold - Seated Table Piriformis Stretch  - 1-2 x daily - 7 x weekly - 3 reps - 30 sec hold - Seated Hip Flexor Stretch  - 1-2 x daily - 7 x weekly - 3 reps - 30 sec hold - Heel Raises with Counter Support  - 1 x daily - 7 x weekly - 3 sets - 10 reps - Standing Hip Abduction with Counter Support  - 1 x daily - 7 x weekly - 3 sets - 10 reps - Standing Hip Extension with Counter Support  - 1 x daily - 7 x weekly - 3 sets - 10 reps - Standing March with Counter Support  - 1 x daily - 7 x weekly - 3 sets - 10 reps  Patient Education - Check for Safety  ASSESSMENT:  CLINICAL IMPRESSION: Started session with Home Fall Prevention Checklist to ensure safety measures at home. Focused primarily on NMR static balance as he is still showing difficulty with stability in positions particularly with trunk or head rotation/movement. Close guarding and assistance was needed for safety. He needed to take rest breaks in between exercises. Incorporated hip strengthening exercises with ankle weights that he tolerated well. Had to increase ankle weights to 3lb due to 2lb weights being too easy for him. He does have ankle weights at home that he can try those exercises with chair support to ensure safety. Lason will benefit from continued skill PT to address ongoing deficits to improve mobility and activity tolerance to help reach maximal level of functional independence.   OBJECTIVE IMPAIRMENTS: Abnormal gait, decreased activity tolerance, decreased balance, decreased coordination, decreased  endurance, decreased knowledge of use of DME, decreased mobility, difficulty walking, decreased ROM, decreased strength, decreased safety awareness, impaired perceived functional ability, impaired flexibility, impaired sensation, improper body mechanics, and postural dysfunction.   ACTIVITY LIMITATIONS: carrying, lifting, bending, standing, squatting, stairs, transfers, bathing, dressing, reach over head, locomotion level, and caring for others  PARTICIPATION LIMITATIONS: meal prep, cleaning, laundry, driving, shopping, community activity, and yard work  PERSONAL FACTORS: Age, Past/current experiences, Time since onset of injury/illness/exacerbation, and 3+ comorbidities: Chronic spinal cerebellar ataxia, neuropathy, osteoporosis, R sided pelvic fracture s/p fall 05/2020, MI/CAD s/p CABG x 4, Aflutter, GERD, B shoulder surgery/RCR, L carpal tunnel release, OSA  are also affecting patient's functional outcome.   REHAB POTENTIAL: Good  CLINICAL DECISION MAKING: Unstable/unpredictable  EVALUATION COMPLEXITY: High   GOALS: Goals reviewed with patient? Yes  SHORT TERM GOALS: Target date: 08/09/2023  Patient will be independent with initial HEP to improve outcomes and carryover.  Baseline:  Goal status: IN PROGRESS  2.  Patient will be educated on strategies to decrease risk of falls.  Baseline:  Goal status: MET 08/03/23 Educated on Fall Prevention  3.  Patient will demonstrate decreased 5xSTS time to </= 14.8 sec to decrease risk for falls with transitional mobility. Baseline: 16.47 sec Goal status: IN PROGRESS  LONG TERM GOALS: Target date:  09/06/2023  Patient will be independent with advanced/ongoing HEP to facilitate ability to maintain/progress functional gains from skilled physical therapy services. Baseline:  Goal status: IN PROGRESS  2.  Patient will be able to ambulate 600' with LRAD on variable surfaces with good safety to access community.  Baseline:  Goal status: IN  PROGRESS  3.  Patient will be able to step up/down curb safely with LRAD for safety with community ambulation.  Baseline:  Goal status: IN PROGRESS   4.  Patient will demonstrate improved B LE strength to >/= 4+/5 for improved stability and ease of mobility . Baseline: Refer to above LE MMT table Goal status: IN PROGRESS  5.  Patient will improve TUG time to </= 15 seconds with walking sticks or LRAD for improved efficiency and safety with transfers. Baseline: 20.63 sec with RW Goal status: IN PROGRESS   6.  Patient will demonstrate gait speed of >/= 1.8 ft/sec (0.55 m/s) with walking sticks or LRAD to be a safe limited community ambulator with decreased risk for recurrent falls.  Baseline: 1.00 ft/sec Goal status: IN PROGRESS  7.  Patient will improve Berg score by at least 8 points to improve safety and stability with ADLs in standing and reduce risk for falls. (MCID= 8 points)  Baseline: 26/56 (07/19/23) Goal status: IN PROGRESS  8.  Patient will demonstrate at least 3 point improvement on DGI to improve gait stability and reduce risk for falls. Baseline: 10/24 (07/19/23) Goal status: IN PROGRESS  9. Patient will report >/= 40% on ABC scale to demonstrate improved balance confidence with functional mobility and gait. Baseline: 360 / 1600 = 22.5 % Goal status: IN PROGRESS   PLAN:  PT FREQUENCY: 2x/week  PT DURATION: 8 weeks  PLANNED INTERVENTIONS: 97164- PT Re-evaluation, 97110-Therapeutic exercises, 97530- Therapeutic activity, 97112- Neuromuscular re-education, 97535- Self Care, 74259- Manual therapy, [phone]- Gait training, Patient/Family education, Balance training, Stair training, Taping, Dry Needling, DME instructions, Cryotherapy, and Moist heat  PLAN FOR NEXT SESSION: Address STG #1 & #3; Increase ankle weights (based on pt's response) to LE strengthening and static balance and update HEP as indicated   Luisa Louk Joseph-Greene, Student-PT 08/03/2023, 2:02 PM

## 2023-08-05 ENCOUNTER — Ambulatory Visit: Payer: Self-pay

## 2023-08-05 DIAGNOSIS — R260 Ataxic gait: Secondary | ICD-10-CM

## 2023-08-05 DIAGNOSIS — R2681 Unsteadiness on feet: Secondary | ICD-10-CM

## 2023-08-05 DIAGNOSIS — M6281 Muscle weakness (generalized): Secondary | ICD-10-CM

## 2023-08-05 NOTE — Therapy (Signed)
 OUTPATIENT PHYSICAL THERAPY TREATMENT   Patient Name: MELTON WALLS MRN: [id number] DOB:1940/05/15, 83 y.o., male Today's Date: 08/06/2023   END OF SESSION:  PT End of Session - 08/05/23 1322     Visit Number 6    Date for PT Re-Evaluation 09/06/23    Authorization Type [employer]    Progress Note Due on Visit 10    PT Start Time 1315    PT Stop Time 1358    PT Time Calculation (min) 43 min    Activity Tolerance Patient tolerated treatment well    Behavior During Therapy WFL for tasks assessed/performed                  Past Medical History:  Diagnosis Date   Carpal tunnel syndrome    Cerebellar ataxia (HCC)    spinal cerebellar ataxia   Dyslipidemia    H/O rotator cuff surgery    Obstructive sleep apnea    Past Surgical History:  Procedure Laterality Date   BYPASS GRAFT AORTA TO AORTA     quadruple bypass   CARPAL TUNNEL RELEASE Left    EYE SURGERY Bilateral    SHOULDER SURGERY     bilateral   TONSILLECTOMY     VASECTOMY     Patient Active Problem List   Diagnosis Date Noted   Osteoporosis, senile 08/27/2022   Neuropathy 12/12/2018   Cerebellar ataxia (HCC) 12/12/2018   Vitreous membranes and strands 10/20/2018   Bilateral cold feet 04/07/2017   Osteopenia of multiple sites 04/07/2017   Vitamin D deficiency 04/07/2017   CAD in native artery 03/09/2017   Old MI (myocardial infarction) 03/09/2017   Carpal tunnel syndrome, 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   Ulnar neuropathy at elbow, 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   PVC's (premature ventricular contractions) 10/09/2015   Chronic dryness of both eyes 09/26/2015   Hypermetropia 09/26/2015   Presbyopia 09/26/2015   Regular astigmatism 09/26/2015   Hypermetropia of both eyes 09/26/2015   Senile nuclear sclerosis 09/26/2015   Stationary peripheral pterygium of right eye 09/26/2015   Unspecified esotropia 09/26/2015   Ventral hernia without obstruction or gangrene 07/30/2015   Dyslipidemia 05/09/2015   OSA (obstructive  sleep apnea) 05/09/2015   S/P CABG (coronary artery bypass graft) 05/09/2015   Unspecified atrial flutter (HCC) 05/09/2015    PCP: Loyal Jacobson, MD   REFERRING PROVIDER: Loyal Jacobson, MD   REFERRING DIAG:  R26.81 (ICD-10-CM) - Unsteadiness on feet R26.89 (ICD-10-CM) - Other abnormalities of gait and mobility  THERAPY DIAG:  Unsteadiness on feet  Ataxic gait  Muscle weakness (generalized)  RATIONALE FOR EVALUATION AND TREATMENT: Rehabilitation  ONSET DATE: Chronic  NEXT MD VISIT: 12/16/23   SUBJECTIVE:  SUBJECTIVE STATEMENT: Braylon reports that he notices his stamina needs work, he feels good today tho.  Eval: Pt reports how he feels depends on how much sleep he gets.  Arrives to PT with RW but states he also has a 4WW and hiking poles which he uses intermixed. His wife feels like he has been more unsteady of late.  He notes some R knee pain a few months back but now resolved.  Works with Systems analyst 2x/wk - STS, dumbbells, planks, etc.  Sees a chiropractor for his back 1x/month.  Pt accompanied by: self  PAIN: Are you having pain? No  PERTINENT HISTORY:  Chronic spinal cerebellar ataxia, neuropathy, osteoporosis, R sided pelvic fracture s/p fall 05/2020, MI/CAD s/p CABG x 4, Aflutter, GERD, B shoulder surgery/RCR, L carpal tunnel release, OSA  PRECAUTIONS: Fall  RED FLAGS: None  WEIGHT BEARING RESTRICTIONS: No  FALLS:  Has patient fallen in last 6 months? No  LIVING ENVIRONMENT: Lives with: lives with their family Lives in: House/apartment Stairs: Yes: Internal: 14 steps; on right going up and on left going up and External: 1 or 3 steps; on right going up and on left going up Has following equipment at home: Environmental consultant - 2 wheeled, Environmental consultant - 4 wheeled, shower  chair, Grab bars, and hiking poles  OCCUPATION: Retired  PLOF: Independent, Independent with community mobility with device, and Leisure: community outings with friends, walking loops in driveway, driving with son (adult CP); works with Systems analyst 2x/wk - STS, dumbbells, planks, etc   PATIENT GOALS: "To feel more confident walking with the walking sticks."   OBJECTIVE: (objective measures completed at initial evaluation unless otherwise dated)  DIAGNOSTIC FINDINGS:  06/22/23 - XR L toes (Left great toe skin breakdown) IMPRESSION:  Mild soft tissue swelling. No radiographic evidence of osteomyelitis.  Sonogram negative for DVT  COGNITION: Overall cognitive status: Within functional limits for tasks assessed   SENSATION: B LE neuropathy - predominantly in B feet  COORDINATION: Gross motor mild impairment B LE  EDEMA:  Intermittent distal LE edema  POSTURE:  No Significant postural limitations  MUSCLE LENGTH: Hamstrings: severe tight B ITB: mod tight L>R Piriformis: mod/severe tight B Hip flexors: mod tight B Quads: mild tight B Heelcord:   LOWER EXTREMITY ROM:    Grossly WFL other than restrictions related to above muscle tightness  LOWER EXTREMITY MMT:    MMT Right eval Left eval  Hip flexion 4+ 4  Hip extension 4 4  Hip abduction 4 4  Hip adduction 4+ 4+  Hip internal rotation 4+ 4+  Hip external rotation 4 4  Knee flexion 5 5  Knee extension 5 5  Ankle dorsiflexion 4+ 4  Ankle plantarflexion    Ankle inversion    Ankle eversion    (Blank rows = not tested)  BED MOBILITY:  Independent  TRANSFERS: Assistive device utilized: Environmental consultant - 2 wheeled  Sit to stand: SBA Stand to sit: SBA Chair to chair:    Floor:     GAIT: Distance walked: clinic distances Assistive device utilized: Environmental consultant - 2 wheeled Level of assistance: Modified independence and SBA Gait pattern: step to pattern, decreased hip/knee flexion- Right, decreased hip/knee flexion- Left,  ataxic, and trunk flexed Comments:   RAMP: Level of Assistance:  NT Assistive device utilized:    Ramp Comments:   CURB:  Level of Assistance:  NT Assistive device utilized:    Curb Comments:   STAIRS:  Level of Assistance:  NT  Stair Negotiation Technique:  Number of Stairs:    Height of Stairs:   Comments:   FUNCTIONAL TESTS:  5 times sit to stand: 16.47 sec w/o UE assist Timed up and go (TUG): 20.63 sec with RW 10 meter walk test: 32.68 sec with RW; 1.00 ft/sec with RW  Berg Balance Scale: 26/56; Scores of <36 indicate high risk for falls (close to 100%) - 07/19/23 Dynamic Gait Index: 10/24; Scores of 19 or less are predictive of falls in older community living adults - 07/19/23 MCTSIB: Condition 1: Avg of 3 trials: 30 sec, Condition 2: Avg of 3 trials: 8.67 sec, Condition 3: Avg of 3 trials: 30 sec, Condition 4: Avg of 3 trials: 4 sec, and Total Score: 72.67/120  PATIENT SURVEYS:  ABC scale 360 / 1600 = 22.5 %; <50% indicates low level of physical functioning and risk for recurrent falls   TODAY'S TREATMENT:  08/05/23 THERAPEUTIC EXERCISE: To improve strength, endurance, ROM, and flexibility.  Demonstration, verbal and tactile cues throughout for technique. NuStep L6 x UE/LE Seated hamstring stretch x 30" bil Seated lunge hip flexor stretch x 30" bil Seated table piriformis stretch x 30" bil Knee flexion 40lb 2x10 Knee extension 30lb 2x10  NEUROMUSCULAR RE-EDUCATION: To improve proprioception, balance, and kinesthesia. Squats using ladder 2x10 Standing bird dog x 10 bil Standing hip up and over x 10 bil Fwd step up and over x 10 bil  5xSTS  08/03/23 THERAPEUTIC EXERCISE: To improve strength, endurance, ROM, and flexibility.  Demonstration, verbal and tactile cues throughout for technique. NuStep L6 x UE/LE  SELF CARE: Provided education to promote safe home environment.  Reviewed the "Check for Safety - Home Fall Prevention Checklist for Older Adults"  to help identify fall risk hazards in the home along with strategies to reduce fall risk at home   NEUROMUSCULAR RE-EDUCATION: To improve balance, coordination, kinesthesia, and proprioception. Corner balance progression with narrow BOS on firm surface with arms at sides and arms crossed on chest            Eyes open: static stance x 30 sec each with arms at sides and arms crossed on chest horiz head turns x 5 vertical head nods x 5 trunk rotation x 5 Eyes closed: static stance x 15 sec - arms at side - did w/o leaning on wall or PT Corner balance progression with partial tandem on firm surface with arms at sides and arms crossed on chest            Eyes open: static balance x 30 sec each with arms at sides and arms crossed on chest horiz head turns x 5 vertical head nods x 5 trunk rotation x 5 Standing Hip ABD w/ 2lb weights around ankle in RW, chair behind pt - found too easy Standing Hip ABD, EXT & marching w/ 3lb weights around ankle in RW - PT provide close guarding; more challenging & appropriate x 10 B  07/29/23 Therapeutic Exercise: to improve strength, ROM, flexibility, and endurance  Nustep L6x3min UE/LE BATCA leg curls 35lb 2x10 BLE BATCA leg extension 25lb 2x10 BLE  NEUROMUSCULAR RE-EDUCATION: To improve proprioception, balance, and kinesthesia. Standing balance with head turns 2x10 Standing balance with trunk rotation 2x10 Standing balance with narrow BOS x 45 seconds Semi tandem stance bil for 2x15" Forward reach bil x 10  Heel raises 2x10 Squats with intermittent UE support 2x10 Narrow BOS with horizontal head turns x 10  07/26/23 Therapeutic Exercise: to improve strength, ROM, flexibility, and endurance  Bike  L2x25min Standing heel raises 2 x 10 Standing hip abduction 2 x 10  Standing hip extension 2 x 10 - cues for posture Standing marching 2 x 10 Seated hamstring stretch x 30" bil Seated table piriformis stretch x 30" bil NEUROMUSCULAR RE-EDUCATION: To  improve proprioception, balance, and kinesthesia. Standing balance with head turns 2x10 Standing balance with trunk rotation 2x10 Standing balance with narrow BOS 2x for 15 seconds Semi tandem stance bil for 15"   PATIENT EDUCATION:  Education details: HEP review, HEP progression - pt instructed to add 3lb ankle weights for exercises, continue with current HEP, and Home Fall Prevention Checklist   Person educated: Patient Education method: Explanation, Demonstration, Tactile cues, Verbal cues, and Handouts Education comprehension: verbalized understanding, returned demonstration, verbal cues required, tactile cues required, and needs further education  HOME EXERCISE PROGRAM: Access Code: [email or web address] URL: [email or web address] Date: 08/03/2023 Prepared by: Talia Joseph-Greene  Exercises - Seated Hamstring Stretch  - 1-2 x daily - 7 x weekly - 3 reps - 30 sec hold - Seated Table Piriformis Stretch  - 1-2 x daily - 7 x weekly - 3 reps - 30 sec hold - Seated Hip Flexor Stretch  - 1-2 x daily - 7 x weekly - 3 reps - 30 sec hold - Heel Raises with Counter Support  - 1 x daily - 7 x weekly - 3 sets - 10 reps - Standing Hip Abduction with Counter Support  - 1 x daily - 7 x weekly - 3 sets - 10 reps - Standing Hip Extension with Counter Support  - 1 x daily - 7 x weekly - 3 sets - 10 reps - Standing March with Counter Support  - 1 x daily - 7 x weekly - 3 sets - 10 reps  Patient Education - Check for Safety  ASSESSMENT:  CLINICAL IMPRESSION: Continued with MNR focusing on single leg stability with UE support, as we went to 1HA he started to lose his balance more. He scored 12 sec on 5xSTS but some reps he did not fully sit down so we might need to reassess this. He continues to show the need for focus on static balance interventions to improve his stability. Elby will benefit from continued skill PT to address ongoing deficits to improve mobility and activity tolerance to help  reach maximal level of functional independence.   OBJECTIVE IMPAIRMENTS: Abnormal gait, decreased activity tolerance, decreased balance, decreased coordination, decreased endurance, decreased knowledge of use of DME, decreased mobility, difficulty walking, decreased ROM, decreased strength, decreased safety awareness, impaired perceived functional ability, impaired flexibility, impaired sensation, improper body mechanics, and postural dysfunction.   ACTIVITY LIMITATIONS: carrying, lifting, bending, standing, squatting, stairs, transfers, bathing, dressing, reach over head, locomotion level, and caring for others  PARTICIPATION LIMITATIONS: meal prep, cleaning, laundry, driving, shopping, community activity, and yard work  PERSONAL FACTORS: Age, Past/current experiences, Time since onset of injury/illness/exacerbation, and 3+ comorbidities: Chronic spinal cerebellar ataxia, neuropathy, osteoporosis, R sided pelvic fracture s/p fall 05/2020, MI/CAD s/p CABG x 4, Aflutter, GERD, B shoulder surgery/RCR, L carpal tunnel release, OSA  are also affecting patient's functional outcome.   REHAB POTENTIAL: Good  CLINICAL DECISION MAKING: Unstable/unpredictable  EVALUATION COMPLEXITY: High   GOALS: Goals reviewed with patient? Yes  SHORT TERM GOALS: Target date: 08/09/2023  Patient will be independent with initial HEP to improve outcomes and carryover.  Baseline:  Goal status: MET- 08/05/23  2.  Patient will be educated on strategies to decrease risk of falls.  Baseline:  Goal status: MET 08/03/23 Educated on Fall Prevention  3.  Patient will demonstrate decreased 5xSTS time to </= 14.8 sec to decrease risk for falls with transitional mobility. Baseline: 16.47 sec Goal status: IN PROGRESS- 08/05/23- 12 seconds although not fully sitting down on some reps  LONG TERM GOALS: Target date: 09/06/2023  Patient will be independent with advanced/ongoing HEP to facilitate ability to maintain/progress  functional gains from skilled physical therapy services. Baseline:  Goal status: IN PROGRESS  2.  Patient will be able to ambulate 600' with LRAD on variable surfaces with good safety to access community.  Baseline:  Goal status: IN PROGRESS  3.  Patient will be able to step up/down curb safely with LRAD for safety with community ambulation.  Baseline:  Goal status: IN PROGRESS   4.  Patient will demonstrate improved B LE strength to >/= 4+/5 for improved stability and ease of mobility . Baseline: Refer to above LE MMT table Goal status: IN PROGRESS  5.  Patient will improve TUG time to </= 15 seconds with walking sticks or LRAD for improved efficiency and safety with transfers. Baseline: 20.63 sec with RW Goal status: IN PROGRESS   6.  Patient will demonstrate gait speed of >/= 1.8 ft/sec (0.55 m/s) with walking sticks or LRAD to be a safe limited community ambulator with decreased risk for recurrent falls.  Baseline: 1.00 ft/sec Goal status: IN PROGRESS  7.  Patient will improve Berg score by at least 8 points to improve safety and stability with ADLs in standing and reduce risk for falls. (MCID= 8 points)  Baseline: 26/56 (07/19/23) Goal status: IN PROGRESS  8.  Patient will demonstrate at least 3 point improvement on DGI to improve gait stability and reduce risk for falls. Baseline: 10/24 (07/19/23) Goal status: IN PROGRESS  9. Patient will report >/= 40% on ABC scale to demonstrate improved balance confidence with functional mobility and gait. Baseline: 360 / 1600 = 22.5 % Goal status: IN PROGRESS   PLAN:  PT FREQUENCY: 2x/week  PT DURATION: 8 weeks  PLANNED INTERVENTIONS: [id number]- PT Re-evaluation, 97110-Therapeutic exercises, 97530- Therapeutic activity, 97112- Neuromuscular re-education, 97535- Self Care, 54098- Manual therapy, [phone]- Gait training, Patient/Family education, Balance training, Stair training, Taping, Dry Needling, DME instructions, Cryotherapy, and Moist  heat  PLAN FOR NEXT SESSION:  Increase ankle weights (based on pt's response) to LE strengthening and static balance and update HEP as indicated   Darleene Cleaver, PTA 08/06/2023, 11:01 AM

## 2023-08-10 ENCOUNTER — Ambulatory Visit: Admitting: Physical Therapy

## 2023-08-10 ENCOUNTER — Encounter: Payer: Self-pay | Admitting: Physical Therapy

## 2023-08-10 DIAGNOSIS — M6281 Muscle weakness (generalized): Secondary | ICD-10-CM

## 2023-08-10 DIAGNOSIS — R260 Ataxic gait: Secondary | ICD-10-CM

## 2023-08-10 DIAGNOSIS — R2681 Unsteadiness on feet: Secondary | ICD-10-CM

## 2023-08-10 NOTE — Therapy (Signed)
 OUTPATIENT PHYSICAL THERAPY TREATMENT   Patient Name: Andre [name] MRN: [id number] DOB:30-Oct-1940, 83 y.o., male Today's Date: 08/10/2023   END OF SESSION:  PT End of Session - 08/10/23 1318     Visit Number 7    Date for PT Re-Evaluation 09/06/23    Authorization Type [employer]    Progress Note Due on Visit 10    PT Start Time 1318    PT Stop Time 1359    PT Time Calculation (min) 41 min    Activity Tolerance Patient tolerated treatment well    Behavior During Therapy WFL for tasks assessed/performed                   Past Medical History:  Diagnosis Date   Carpal tunnel syndrome    Cerebellar ataxia (HCC)    spinal cerebellar ataxia   Dyslipidemia    H/O rotator cuff surgery    Obstructive sleep apnea    Past Surgical History:  Procedure Laterality Date   BYPASS GRAFT AORTA TO AORTA     quadruple bypass   CARPAL TUNNEL RELEASE Left    EYE SURGERY Bilateral    SHOULDER SURGERY     bilateral   TONSILLECTOMY     VASECTOMY     Patient Active Problem List   Diagnosis Date Noted   Osteoporosis, senile 08/27/2022   Neuropathy 12/12/2018   Cerebellar ataxia (HCC) 12/12/2018   Vitreous membranes and strands 10/20/2018   Bilateral cold feet 04/07/2017   Osteopenia of multiple sites 04/07/2017   Vitamin D deficiency 04/07/2017   CAD in native artery 03/09/2017   Old MI (myocardial infarction) 03/09/2017   Carpal tunnel syndrome, 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   Ulnar neuropathy at elbow, 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   PVC's (premature ventricular contractions) 10/09/2015   Chronic dryness of both eyes 09/26/2015   Hypermetropia 09/26/2015   Presbyopia 09/26/2015   Regular astigmatism 09/26/2015   Hypermetropia of both eyes 09/26/2015   Senile nuclear sclerosis 09/26/2015   Stationary peripheral pterygium of right eye 09/26/2015   Unspecified esotropia 09/26/2015   Ventral hernia without obstruction or gangrene 07/30/2015   Dyslipidemia 05/09/2015   OSA (obstructive  sleep apnea) 05/09/2015   S/P CABG (coronary artery bypass graft) 05/09/2015   Unspecified atrial flutter (HCC) 05/09/2015    PCP: Jayne Mews, MD   REFERRING PROVIDER: Jayne Mews, MD   REFERRING DIAG:  R26.81 (ICD-10-CM) - Unsteadiness on feet R26.89 (ICD-10-CM) - Other abnormalities of gait and mobility  THERAPY DIAG:  Unsteadiness on feet  Ataxic gait  Muscle weakness (generalized)  RATIONALE FOR EVALUATION AND TREATMENT: Rehabilitation  ONSET DATE: Chronic  NEXT MD VISIT: 12/16/23   SUBJECTIVE:  SUBJECTIVE STATEMENT: Lonney reports some workout related soreness but otherwise no concerns.  Eval: Pt reports how he feels depends on how much sleep he gets.  Arrives to PT with RW but states he also has a 4WW and hiking poles which he uses intermixed. His wife feels like he has been more unsteady of late.  He notes some R knee pain a few months back but now resolved.  Works with Systems analyst 2x/wk - STS, dumbbells, planks, etc.  Sees a chiropractor for his back 1x/month.  Pt accompanied by: self  PAIN: Are you having pain? No  PERTINENT HISTORY:  Chronic spinal cerebellar ataxia, neuropathy, osteoporosis, R sided pelvic fracture s/p fall 05/2020, MI/CAD s/p CABG x 4, Aflutter, GERD, B shoulder surgery/RCR, L carpal tunnel release, OSA  PRECAUTIONS: Fall  RED FLAGS: None  WEIGHT BEARING RESTRICTIONS: No  FALLS:  Has patient fallen in last 6 months? No  LIVING ENVIRONMENT: Lives with: lives with their family Lives in: House/apartment Stairs: Yes: Internal: 14 steps; on right going up and on left going up and External: 1 or 3 steps; on right going up and on left going up Has following equipment at home: Environmental consultant - 2 wheeled, Environmental consultant - 4 wheeled, shower chair,  Grab bars, and hiking poles  OCCUPATION: Retired  PLOF: Independent, Independent with community mobility with device, and Leisure: community outings with friends, walking loops in driveway, driving with son (adult CP); works with Systems analyst 2x/wk - STS, dumbbells, planks, etc   PATIENT GOALS: "To feel more confident walking with the walking sticks."   OBJECTIVE: (objective measures completed at initial evaluation unless otherwise dated)  DIAGNOSTIC FINDINGS:  06/22/23 - XR L toes (Left great toe skin breakdown) IMPRESSION:  Mild soft tissue swelling. No radiographic evidence of osteomyelitis.  Sonogram negative for DVT  COGNITION: Overall cognitive status: Within functional limits for tasks assessed   SENSATION: B LE neuropathy - predominantly in B feet  COORDINATION: Gross motor mild impairment B LE  EDEMA:  Intermittent distal LE edema  POSTURE:  No Significant postural limitations  MUSCLE LENGTH: Hamstrings: severe tight B ITB: mod tight L>R Piriformis: mod/severe tight B Hip flexors: mod tight B Quads: mild tight B Heelcord:   LOWER EXTREMITY ROM:    Grossly WFL other than restrictions related to above muscle tightness  LOWER EXTREMITY MMT:    MMT Right eval Left eval R 08/10/23 L 08/10/23  Hip flexion 4+ 4 5 5   Hip extension 4 4 4+ 4+  Hip abduction 4 4 4 4   Hip adduction 4+ 4+ 4+ 5  Hip internal rotation 4+ 4+ 5 5  Hip external rotation 4 4 4+ 4+  Knee flexion 5 5 5 5   Knee extension 5 5 5 5   Ankle dorsiflexion 4+ 4 5 4+  Ankle plantarflexion      Ankle inversion      Ankle eversion      (Blank rows = not tested)  BED MOBILITY:  Independent  TRANSFERS: Assistive device utilized: Environmental consultant - 2 wheeled  Sit to stand: SBA Stand to sit: SBA Chair to chair:    Floor:     GAIT: Distance walked: clinic distances Assistive device utilized: Environmental consultant - 2 wheeled Level of assistance: Modified independence and SBA Gait pattern: step to pattern,  decreased hip/knee flexion- Right, decreased hip/knee flexion- Left, ataxic, and trunk flexed Comments:   RAMP: Level of Assistance:  NT Assistive device utilized:    Ramp Comments:   CURB:  Level of  Assistance:  NT Assistive device utilized:    Curb Comments:   STAIRS:  Level of Assistance:  NT  Stair Negotiation Technique:   Number of Stairs:    Height of Stairs:   Comments:   FUNCTIONAL TESTS:  5 times sit to stand: 16.47 sec w/o UE assist Timed up and go (TUG): 20.63 sec with RW 10 meter walk test: 32.68 sec with RW; 1.00 ft/sec with RW  Berg Balance Scale: 26/56; Scores of <36 indicate high risk for falls (close to 100%) - 07/19/23 Dynamic Gait Index: 10/24; Scores of 19 or less are predictive of falls in older community living adults - 07/19/23 MCTSIB: Condition 1: Avg of 3 trials: 30 sec, Condition 2: Avg of 3 trials: 8.67 sec, Condition 3: Avg of 3 trials: 30 sec, Condition 4: Avg of 3 trials: 4 sec, and Total Score: 72.67/120  PATIENT SURVEYS:  ABC scale 360 / 1600 = 22.5 %; <50% indicates low level of physical functioning and risk for recurrent falls   TODAY'S TREATMENT:   08/10/23 THERAPEUTIC EXERCISE: To improve strength and endurance.  Demonstration, verbal and tactile cues throughout for technique.  Rec bike - L3 x 6 min  THERAPEUTIC ACTIVITIES: To improve functional performance.  Demonstration, verbal and tactile cues throughout for technique. 5xSTS = 14.09 sec LE MMT  GAIT TRAINING: To improve safety with hiking poles and prepare to wean to LRAD. 400 ft with B hiking poles and CGA of PT - cues to slow pace and for proper sequencing of hiking poles, advancing poles further forward so that he is not walking past poles with steps   NEUROMUSCULAR RE-EDUCATION: To improve balance, coordination, kinesthesia, posture, proprioception, reduce fall risk, and amplitude of movement. Standing between 2 chairs with hands on backs of chairs: Alt fwd step with weight shift  onto fwd leg x 10 focusing on upright posture and trunk extension Alt backward step with weight shift onto fwd leg x 10 with slight trunk flexion for counterbalance to reduce risk for posterior LOB Unilateral step-through fwd and back x 10 bil   08/05/23 THERAPEUTIC EXERCISE: To improve strength, endurance, ROM, and flexibility.  Demonstration, verbal and tactile cues throughout for technique. NuStep L6 x UE/LE Seated hamstring stretch x 30" bil Seated lunge hip flexor stretch x 30" bil Seated table piriformis stretch x 30" bil Knee flexion 40lb 2x10 Knee extension 30lb 2x10  NEUROMUSCULAR RE-EDUCATION: To improve proprioception, balance, and kinesthesia. Squats using ladder 2x10 Standing bird dog x 10 bil Standing hip up and over x 10 bil Fwd step up and over x 10 bil  5xSTS   08/03/23 THERAPEUTIC EXERCISE: To improve strength, endurance, ROM, and flexibility.  Demonstration, verbal and tactile cues throughout for technique. NuStep L6 x UE/LE  SELF CARE: Provided education to promote safe home environment.  Reviewed the "Check for Safety - Home Fall Prevention Checklist for Older Adults" to help identify fall risk hazards in the home along with strategies to reduce fall risk at home   NEUROMUSCULAR RE-EDUCATION: To improve balance, coordination, kinesthesia, and proprioception. Corner balance progression with narrow BOS on firm surface with arms at sides and arms crossed on chest            Eyes open: static stance x 30 sec each with arms at sides and arms crossed on chest horiz head turns x 5 vertical head nods x 5 trunk rotation x 5 Eyes closed: static stance x 15 sec - arms at side - did w/o  leaning on wall or PT Corner balance progression with partial tandem on firm surface with arms at sides and arms crossed on chest            Eyes open: static balance x 30 sec each with arms at sides and arms crossed on chest horiz head turns x 5 vertical head nods x 5 trunk  rotation x 5 Standing Hip ABD w/ 2lb weights around ankle in RW, chair behind pt - found too easy Standing Hip ABD, EXT & marching w/ 3lb weights around ankle in RW - PT provide close guarding; more challenging & appropriate x 10 B   PATIENT EDUCATION:  Education details: continue with current HEP  Person educated: Patient Education method: Explanation Education comprehension: verbalized understanding  HOME EXERCISE PROGRAM: Access Code: [email or web address] URL: [email or web address] Date: 08/03/2023 Prepared by: Talia Joseph-Greene  Exercises - Seated Hamstring Stretch  - 1-2 x daily - 7 x weekly - 3 reps - 30 sec hold - Seated Table Piriformis Stretch  - 1-2 x daily - 7 x weekly - 3 reps - 30 sec hold - Seated Hip Flexor Stretch  - 1-2 x daily - 7 x weekly - 3 reps - 30 sec hold - Heel Raises with Counter Support  - 1 x daily - 7 x weekly - 3 sets - 10 reps - Standing Hip Abduction with Counter Support  - 1 x daily - 7 x weekly - 3 sets - 10 reps - Standing Hip Extension with Counter Support  - 1 x daily - 7 x weekly - 3 sets - 10 reps - Standing March with Counter Support  - 1 x daily - 7 x weekly - 3 sets - 10 reps  Patient Education - Check for Safety  ASSESSMENT:  CLINICAL IMPRESSION: Mayan is demonstrating good progress with PT with decreased time on 5xSTS to 14.09 sec, achieving remaining STG.  B LE strength improving with overall LE MMT now grossly 4+ to 5/5 with only B hip ABD 4/5.  He reports daily walking of several laps in his driveway and expressed interest in trying to resume using his hiking poles rather than his rollator, therefore worked on gait training with hiking poles providing cues for posture, pacing and sequencing with poles - he demonstrates tendency to pick up speed leading to increased instability with repeated cues necessary to slow pace sufficiently to properly coordinate advancement of hiking poles.  Continued NMR targeting stepping strategies to  increase stride length and stability with multidirectional stepping.  Landis will benefit from continued skill PT to address ongoing deficits to improve mobility and activity tolerance to help reach maximal level of functional independence.   OBJECTIVE IMPAIRMENTS: Abnormal gait, decreased activity tolerance, decreased balance, decreased coordination, decreased endurance, decreased knowledge of use of DME, decreased mobility, difficulty walking, decreased ROM, decreased strength, decreased safety awareness, impaired perceived functional ability, impaired flexibility, impaired sensation, improper body mechanics, and postural dysfunction.   ACTIVITY LIMITATIONS: carrying, lifting, bending, standing, squatting, stairs, transfers, bathing, dressing, reach over head, locomotion level, and caring for others  PARTICIPATION LIMITATIONS: meal prep, cleaning, laundry, driving, shopping, community activity, and yard work  PERSONAL FACTORS: Age, Past/current experiences, Time since onset of injury/illness/exacerbation, and 3+ comorbidities: Chronic spinal cerebellar ataxia, neuropathy, osteoporosis, R sided pelvic fracture s/p fall 05/2020, MI/CAD s/p CABG x 4, Aflutter, GERD, B shoulder surgery/RCR, L carpal tunnel release, OSA  are also affecting patient's functional outcome.   REHAB POTENTIAL: Good  CLINICAL DECISION MAKING: Unstable/unpredictable  EVALUATION  COMPLEXITY: High   GOALS: Goals reviewed with patient? Yes  SHORT TERM GOALS: Target date: 08/09/2023  Patient will be independent with initial HEP to improve outcomes and carryover.  Baseline:  Goal status: MET - 08/05/23  2.  Patient will be educated on strategies to decrease risk of falls.  Baseline:  Goal status: MET - 08/03/23 - Educated on Fall Prevention  3.  Patient will demonstrate decreased 5xSTS time to </= 14.8 sec to decrease risk for falls with transitional mobility. Baseline: 16.47 sec Goal status: MET - 08/10/23 - 14.09  sec  LONG TERM GOALS: Target date: 09/06/2023  Patient will be independent with advanced/ongoing HEP to facilitate ability to maintain/progress functional gains from skilled physical therapy services. Baseline:  Goal status: IN PROGRESS  2.  Patient will be able to ambulate 600' with LRAD on variable surfaces with good safety to access community.  Baseline:  Goal status: IN PROGRESS  3.  Patient will be able to step up/down curb safely with LRAD for safety with community ambulation.  Baseline:  Goal status: IN PROGRESS   4.  Patient will demonstrate improved B LE strength to >/= 4+/5 for improved stability and ease of mobility . Baseline: Refer to above LE MMT table Goal status: IN PROGRESS - 08/10/23 - met except B hip ABD 4/5   5.  Patient will improve TUG time to </= 15 seconds with walking sticks or LRAD for improved efficiency and safety with transfers. Baseline: 20.63 sec with RW Goal status: IN PROGRESS   6.  Patient will demonstrate gait speed of >/= 1.8 ft/sec (0.55 m/s) with walking sticks or LRAD to be a safe limited community ambulator with decreased risk for recurrent falls.  Baseline: 1.00 ft/sec Goal status: IN PROGRESS  7.  Patient will improve Berg score by at least 8 points to improve safety and stability with ADLs in standing and reduce risk for falls. (MCID= 8 points)  Baseline: 26/56 (07/19/23) Goal status: IN PROGRESS  8.  Patient will demonstrate at least 3 point improvement on DGI to improve gait stability and reduce risk for falls. Baseline: 10/24 (07/19/23) Goal status: IN PROGRESS  9. Patient will report >/= 40% on ABC scale to demonstrate improved balance confidence with functional mobility and gait. Baseline: 360 / 1600 = 22.5 % Goal status: IN PROGRESS   PLAN:  PT FREQUENCY: 2x/week  PT DURATION: 8 weeks  PLANNED INTERVENTIONS: 97164- PT Re-evaluation, 97110-Therapeutic exercises, 97530- Therapeutic activity, 97112- Neuromuscular re-education,  97535- Self Care, [id number]- Manual therapy, [phone]- Gait training, Patient/Family education, Balance training, Stair training, Taping, Dry Needling, DME instructions, Cryotherapy, and Moist heat  PLAN FOR NEXT SESSION:  Increase ankle weights (based on pt's response) to LE strengthening and static balance and update HEP as indicated   Francisco Irving, PT 08/10/2023, 2:18 PM

## 2023-08-16 ENCOUNTER — Ambulatory Visit

## 2023-08-16 DIAGNOSIS — R2681 Unsteadiness on feet: Secondary | ICD-10-CM

## 2023-08-16 DIAGNOSIS — R260 Ataxic gait: Secondary | ICD-10-CM

## 2023-08-16 DIAGNOSIS — M6281 Muscle weakness (generalized): Secondary | ICD-10-CM

## 2023-08-16 NOTE — Therapy (Signed)
 OUTPATIENT PHYSICAL THERAPY TREATMENT   Patient Name: Andre [name] MRN: [id number] DOB:[birth year], 83 y.o., male Today's Date: 08/16/2023   END OF SESSION:  PT End of Session - 08/16/23 1358     Visit Number 8    Date for PT Re-Evaluation 09/06/23    Authorization Type [employer]    Progress Note Due on Visit 10    PT Start Time 1400    PT Stop Time 1442    PT Time Calculation (min) 42 min    Activity Tolerance Patient tolerated treatment well    Behavior During Therapy WFL for tasks assessed/performed                    Past Medical History:  Diagnosis Date   Carpal tunnel syndrome    Cerebellar ataxia (HCC)    spinal cerebellar ataxia   Dyslipidemia    H/O rotator cuff surgery    Obstructive sleep apnea    Past Surgical History:  Procedure Laterality Date   BYPASS GRAFT AORTA TO AORTA     quadruple bypass   CARPAL TUNNEL RELEASE Left    EYE SURGERY Bilateral    SHOULDER SURGERY     bilateral   TONSILLECTOMY     VASECTOMY     Patient Active Problem List   Diagnosis Date Noted   Osteoporosis, senile 08/27/2022   Neuropathy 12/12/2018   Cerebellar ataxia (HCC) 12/12/2018   Vitreous membranes and strands 10/20/2018   Bilateral cold feet 04/07/2017   Osteopenia of multiple sites 04/07/2017   Vitamin D deficiency 04/07/2017   CAD in native artery 03/09/2017   Old MI (myocardial infarction) 03/09/2017   Carpal tunnel syndrome, 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   Ulnar neuropathy at elbow, 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   PVC's (premature ventricular contractions) 10/09/2015   Chronic dryness of both eyes 09/26/2015   Hypermetropia 09/26/2015   Presbyopia 09/26/2015   Regular astigmatism 09/26/2015   Hypermetropia of both eyes 09/26/2015   Senile nuclear sclerosis 09/26/2015   Stationary peripheral pterygium of right eye 09/26/2015   Unspecified esotropia 09/26/2015   Ventral hernia without obstruction or gangrene 07/30/2015   Dyslipidemia 05/09/2015   OSA  (obstructive sleep apnea) 05/09/2015   S/P CABG (coronary artery bypass graft) 05/09/2015   Unspecified atrial flutter (HCC) 05/09/2015    PCP: Jayne Mews, MD   REFERRING PROVIDER: Jayne Mews, MD   REFERRING DIAG:  R26.81 (ICD-10-CM) - Unsteadiness on feet R26.89 (ICD-10-CM) - Other abnormalities of gait and mobility  THERAPY DIAG:  Unsteadiness on feet  Ataxic gait  Muscle weakness (generalized)  RATIONALE FOR EVALUATION AND TREATMENT: Rehabilitation  ONSET DATE: Chronic  NEXT MD VISIT: 12/16/23   SUBJECTIVE:  SUBJECTIVE STATEMENT: " Tired today from yesterday."  Eval: Pt reports how he feels depends on how much sleep he gets.  Arrives to PT with RW but states he also has a 4WW and hiking poles which he uses intermixed. His wife feels like he has been more unsteady of late.  He notes some R knee pain a few months back but now resolved.  Works with Systems analyst 2x/wk - STS, dumbbells, planks, etc.  Sees a chiropractor for his back 1x/month.  Pt accompanied by: self  PAIN: Are you having pain? No  PERTINENT HISTORY:  Chronic spinal cerebellar ataxia, neuropathy, osteoporosis, R sided pelvic fracture s/p fall 05/2020, MI/CAD s/p CABG x 4, Aflutter, GERD, B shoulder surgery/RCR, L carpal tunnel release, OSA  PRECAUTIONS: Fall  RED FLAGS: None  WEIGHT BEARING RESTRICTIONS: No  FALLS:  Has patient fallen in last 6 months? No  LIVING ENVIRONMENT: Lives with: lives with their family Lives in: House/apartment Stairs: Yes: Internal: 14 steps; on right going up and on left going up and External: 1 or 3 steps; on right going up and on left going up Has following equipment at home: Environmental consultant - 2 wheeled, Environmental consultant - 4 wheeled, shower chair, Grab bars, and hiking  poles  OCCUPATION: Retired  PLOF: Independent, Independent with community mobility with device, and Leisure: community outings with friends, walking loops in driveway, driving with son (adult CP); works with Systems analyst 2x/wk - STS, dumbbells, planks, etc   PATIENT GOALS: "To feel more confident walking with the walking sticks."   OBJECTIVE: (objective measures completed at initial evaluation unless otherwise dated)  DIAGNOSTIC FINDINGS:  06/22/23 - XR L toes (Left great toe skin breakdown) IMPRESSION:  Mild soft tissue swelling. No radiographic evidence of osteomyelitis.  Sonogram negative for DVT  COGNITION: Overall cognitive status: Within functional limits for tasks assessed   SENSATION: B LE neuropathy - predominantly in B feet  COORDINATION: Gross motor mild impairment B LE  EDEMA:  Intermittent distal LE edema  POSTURE:  No Significant postural limitations  MUSCLE LENGTH: Hamstrings: severe tight B ITB: mod tight L>R Piriformis: mod/severe tight B Hip flexors: mod tight B Quads: mild tight B Heelcord:   LOWER EXTREMITY ROM:    Grossly WFL other than restrictions related to above muscle tightness  LOWER EXTREMITY MMT:    MMT Right eval Left eval R 08/10/23 L 08/10/23  Hip flexion 4+ 4 5 5   Hip extension 4 4 4+ 4+  Hip abduction 4 4 4 4   Hip adduction 4+ 4+ 4+ 5  Hip internal rotation 4+ 4+ 5 5  Hip external rotation 4 4 4+ 4+  Knee flexion 5 5 5 5   Knee extension 5 5 5 5   Ankle dorsiflexion 4+ 4 5 4+  Ankle plantarflexion      Ankle inversion      Ankle eversion      (Blank rows = not tested)  BED MOBILITY:  Independent  TRANSFERS: Assistive device utilized: Environmental consultant - 2 wheeled  Sit to stand: SBA Stand to sit: SBA Chair to chair:    Floor:     GAIT: Distance walked: clinic distances Assistive device utilized: Environmental consultant - 2 wheeled Level of assistance: Modified independence and SBA Gait pattern: step to pattern, decreased hip/knee flexion-  Right, decreased hip/knee flexion- Left, ataxic, and trunk flexed Comments:   RAMP: Level of Assistance:  NT Assistive device utilized:    Ramp Comments:   CURB:  Level of Assistance:  NT Assistive device  utilized:    Curb Comments:   STAIRS:  Level of Assistance:  NT  Stair Negotiation Technique:   Number of Stairs:    Height of Stairs:   Comments:   FUNCTIONAL TESTS:  5 times sit to stand: 16.47 sec w/o UE assist Timed up and go (TUG): 20.63 sec with RW 10 meter walk test: 32.68 sec with RW; 1.00 ft/sec with RW  Berg Balance Scale: 26/56; Scores of <36 indicate high risk for falls (close to 100%) - 07/19/23 Dynamic Gait Index: 10/24; Scores of 19 or less are predictive of falls in older community living adults - 07/19/23 MCTSIB: Condition 1: Avg of 3 trials: 30 sec, Condition 2: Avg of 3 trials: 8.67 sec, Condition 3: Avg of 3 trials: 30 sec, Condition 4: Avg of 3 trials: 4 sec, and Total Score: 72.67/120  PATIENT SURVEYS:  ABC scale 360 / 1600 = 22.5 %; <50% indicates low level of physical functioning and risk for recurrent falls   TODAY'S TREATMENT:  08/16/23 NEUROMUSCULAR RE-EDUCATION: To improve proprioception, balance, and kinesthesia. Nustep L6x49min UE/LE- for reciprocal arm motion Alt fwd stepping x 10 bil Alt retro stepping x 10 bil Stepping hip up and over x 10 bil Standing bird dog 2 x 10 bil Toe taps to two yoga blocks on top of each other 2x10 Stepping up and over on yoga block x 10 bil- unstable when stepping with RLE  08/10/23 THERAPEUTIC EXERCISE: To improve strength and endurance.  Demonstration, verbal and tactile cues throughout for technique.  Rec bike - L3 x 6 min  THERAPEUTIC ACTIVITIES: To improve functional performance.  Demonstration, verbal and tactile cues throughout for technique. 5xSTS = 14.09 sec LE MMT  GAIT TRAINING: To improve safety with hiking poles and prepare to wean to LRAD. 400 ft with B hiking poles and CGA of PT - cues to slow  pace and for proper sequencing of hiking poles, advancing poles further forward so that he is not walking past poles with steps   NEUROMUSCULAR RE-EDUCATION: To improve balance, coordination, kinesthesia, posture, proprioception, reduce fall risk, and amplitude of movement. Standing between 2 chairs with hands on backs of chairs: Alt fwd step with weight shift onto fwd leg x 10 focusing on upright posture and trunk extension Alt backward step with weight shift onto fwd leg x 10 with slight trunk flexion for counterbalance to reduce risk for posterior LOB Unilateral step-through fwd and back x 10 bil   08/05/23 THERAPEUTIC EXERCISE: To improve strength, endurance, ROM, and flexibility.  Demonstration, verbal and tactile cues throughout for technique. NuStep L6 x UE/LE Seated hamstring stretch x 30" bil Seated lunge hip flexor stretch x 30" bil Seated table piriformis stretch x 30" bil Knee flexion 40lb 2x10 Knee extension 30lb 2x10  NEUROMUSCULAR RE-EDUCATION: To improve proprioception, balance, and kinesthesia. Squats using ladder 2x10 Standing bird dog x 10 bil Standing hip up and over x 10 bil Fwd step up and over x 10 bil  5xSTS   08/03/23 THERAPEUTIC EXERCISE: To improve strength, endurance, ROM, and flexibility.  Demonstration, verbal and tactile cues throughout for technique. NuStep L6 x UE/LE  SELF CARE: Provided education to promote safe home environment.  Reviewed the "Check for Safety - Home Fall Prevention Checklist for Older Adults" to help identify fall risk hazards in the home along with strategies to reduce fall risk at home   NEUROMUSCULAR RE-EDUCATION: To improve balance, coordination, kinesthesia, and proprioception. Corner balance progression with narrow BOS on firm surface  with arms at sides and arms crossed on chest            Eyes open: static stance x 30 sec each with arms at sides and arms crossed on chest horiz head turns x 5 vertical head nods x  5 trunk rotation x 5 Eyes closed: static stance x 15 sec - arms at side - did w/o leaning on wall or PT Corner balance progression with partial tandem on firm surface with arms at sides and arms crossed on chest            Eyes open: static balance x 30 sec each with arms at sides and arms crossed on chest horiz head turns x 5 vertical head nods x 5 trunk rotation x 5 Standing Hip ABD w/ 2lb weights around ankle in RW, chair behind pt - found too easy Standing Hip ABD, EXT & marching w/ 3lb weights around ankle in RW - PT provide close guarding; more challenging & appropriate x 10 B   PATIENT EDUCATION:  Education details: continue with current HEP  Person educated: Patient Education method: Explanation Education comprehension: verbalized understanding  HOME EXERCISE PROGRAM: Access Code: [email or web address] URL: [email or web address] Date: 08/03/2023 Prepared by: Talia Joseph-Greene  Exercises - Seated Hamstring Stretch  - 1-2 x daily - 7 x weekly - 3 reps - 30 sec hold - Seated Table Piriformis Stretch  - 1-2 x daily - 7 x weekly - 3 reps - 30 sec hold - Seated Hip Flexor Stretch  - 1-2 x daily - 7 x weekly - 3 reps - 30 sec hold - Heel Raises with Counter Support  - 1 x daily - 7 x weekly - 3 sets - 10 reps - Standing Hip Abduction with Counter Support  - 1 x daily - 7 x weekly - 3 sets - 10 reps - Standing Hip Extension with Counter Support  - 1 x daily - 7 x weekly - 3 sets - 10 reps - Standing March with Counter Support  - 1 x daily - 7 x weekly - 3 sets - 10 reps  Patient Education - Check for Safety  ASSESSMENT:  CLINICAL IMPRESSION: Advanced work on [employer], stepping and WS interventions also working on balance to improve patient's stability and proprioceptive sense. He definitely shows more difficulty with fwd stepping exercises especially over yoga block. Cas will benefit from continued skill PT to address ongoing deficits to improve mobility and activity  tolerance to help reach maximal level of functional independence.   OBJECTIVE IMPAIRMENTS: Abnormal gait, decreased activity tolerance, decreased balance, decreased coordination, decreased endurance, decreased knowledge of use of DME, decreased mobility, difficulty walking, decreased ROM, decreased strength, decreased safety awareness, impaired perceived functional ability, impaired flexibility, impaired sensation, improper body mechanics, and postural dysfunction.   ACTIVITY LIMITATIONS: carrying, lifting, bending, standing, squatting, stairs, transfers, bathing, dressing, reach over head, locomotion level, and caring for others  PARTICIPATION LIMITATIONS: meal prep, cleaning, laundry, driving, shopping, community activity, and yard work  PERSONAL FACTORS: Age, Past/current experiences, Time since onset of injury/illness/exacerbation, and 3+ comorbidities: Chronic spinal cerebellar ataxia, neuropathy, osteoporosis, R sided pelvic fracture s/p fall 05/2020, MI/CAD s/p CABG x 4, Aflutter, GERD, B shoulder surgery/RCR, L carpal tunnel release, OSA  are also affecting patient's functional outcome.   REHAB POTENTIAL: Good  CLINICAL DECISION MAKING: Unstable/unpredictable  EVALUATION COMPLEXITY: High   GOALS: Goals reviewed with patient? Yes  SHORT TERM GOALS: Target date: 08/09/2023  Patient will be independent with initial HEP to improve  outcomes and carryover.  Baseline:  Goal status: MET - 08/05/23  2.  Patient will be educated on strategies to decrease risk of falls.  Baseline:  Goal status: MET - 08/03/23 - Educated on Fall Prevention  3.  Patient will demonstrate decreased 5xSTS time to </= 14.8 sec to decrease risk for falls with transitional mobility. Baseline: 16.47 sec Goal status: MET - 08/10/23 - 14.09 sec  LONG TERM GOALS: Target date: 09/06/2023  Patient will be independent with advanced/ongoing HEP to facilitate ability to maintain/progress functional gains from skilled  physical therapy services. Baseline:  Goal status: IN PROGRESS  2.  Patient will be able to ambulate 600' with LRAD on variable surfaces with good safety to access community.  Baseline:  Goal status: IN PROGRESS  3.  Patient will be able to step up/down curb safely with LRAD for safety with community ambulation.  Baseline:  Goal status: IN PROGRESS   4.  Patient will demonstrate improved B LE strength to >/= 4+/5 for improved stability and ease of mobility . Baseline: Refer to above LE MMT table Goal status: IN PROGRESS - 08/10/23 - met except B hip ABD 4/5   5.  Patient will improve TUG time to </= 15 seconds with walking sticks or LRAD for improved efficiency and safety with transfers. Baseline: 20.63 sec with RW Goal status: IN PROGRESS   6.  Patient will demonstrate gait speed of >/= 1.8 ft/sec (0.55 m/s) with walking sticks or LRAD to be a safe limited community ambulator with decreased risk for recurrent falls.  Baseline: 1.00 ft/sec Goal status: IN PROGRESS  7.  Patient will improve Berg score by at least 8 points to improve safety and stability with ADLs in standing and reduce risk for falls. (MCID= 8 points)  Baseline: 26/56 (07/19/23) Goal status: IN PROGRESS  8.  Patient will demonstrate at least 3 point improvement on DGI to improve gait stability and reduce risk for falls. Baseline: 10/24 (07/19/23) Goal status: IN PROGRESS  9. Patient will report >/= 40% on ABC scale to demonstrate improved balance confidence with functional mobility and gait. Baseline: 360 / 1600 = 22.5 % Goal status: IN PROGRESS   PLAN:  PT FREQUENCY: 2x/week  PT DURATION: 8 weeks  PLANNED INTERVENTIONS: 97164- PT Re-evaluation, 97110-Therapeutic exercises, 97530- Therapeutic activity, 97112- Neuromuscular re-education, 97535- Self Care, [id number]- Manual therapy, [phone]- Gait training, Patient/Family education, Balance training, Stair training, Taping, Dry Needling, DME instructions, Cryotherapy,  and Moist heat  PLAN FOR NEXT SESSION:  Increase ankle weights (based on pt's response) to LE strengthening and static balance and update HEP as indicated   Samuella Crocker, PTA 08/16/2023, 2:45 PM

## 2023-08-19 ENCOUNTER — Encounter

## 2023-08-23 ENCOUNTER — Ambulatory Visit

## 2023-08-23 DIAGNOSIS — R260 Ataxic gait: Secondary | ICD-10-CM

## 2023-08-23 DIAGNOSIS — R2681 Unsteadiness on feet: Secondary | ICD-10-CM

## 2023-08-23 DIAGNOSIS — M6281 Muscle weakness (generalized): Secondary | ICD-10-CM

## 2023-08-23 NOTE — Therapy (Signed)
 OUTPATIENT PHYSICAL THERAPY TREATMENT   Patient Name: Andre [name] MRN: [id number] DOB:Sep 25, 1940, 83 y.o., male Today's Date: 08/23/2023   END OF SESSION:  PT End of Session - 08/23/23 1448     Visit Number 9    Date for PT Re-Evaluation 09/06/23    Authorization Type [employer]    Progress Note Due on Visit 10    PT Start Time 1400    PT Stop Time 1446    PT Time Calculation (min) 46 min    Activity Tolerance Patient tolerated treatment well    Behavior During Therapy WFL for tasks assessed/performed                     Past Medical History:  Diagnosis Date   Carpal tunnel syndrome    Cerebellar ataxia (HCC)    spinal cerebellar ataxia   Dyslipidemia    H/O rotator cuff surgery    Obstructive sleep apnea    Past Surgical History:  Procedure Laterality Date   BYPASS GRAFT AORTA TO AORTA     quadruple bypass   CARPAL TUNNEL RELEASE Left    EYE SURGERY Bilateral    SHOULDER SURGERY     bilateral   TONSILLECTOMY     VASECTOMY     Patient Active Problem List   Diagnosis Date Noted   Osteoporosis, senile 08/27/2022   Neuropathy 12/12/2018   Cerebellar ataxia (HCC) 12/12/2018   Vitreous membranes and strands 10/20/2018   Bilateral cold feet 04/07/2017   Osteopenia of multiple sites 04/07/2017   Vitamin D deficiency 04/07/2017   CAD in native artery 03/09/2017   Old MI (myocardial infarction) 03/09/2017   Carpal tunnel syndrome, 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   Ulnar neuropathy at elbow, 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   PVC's (premature ventricular contractions) 10/09/2015   Chronic dryness of both eyes 09/26/2015   Hypermetropia 09/26/2015   Presbyopia 09/26/2015   Regular astigmatism 09/26/2015   Hypermetropia of both eyes 09/26/2015   Senile nuclear sclerosis 09/26/2015   Stationary peripheral pterygium of right eye 09/26/2015   Unspecified esotropia 09/26/2015   Ventral hernia without obstruction or gangrene 07/30/2015   Dyslipidemia 05/09/2015   OSA  (obstructive sleep apnea) 05/09/2015   S/P CABG (coronary artery bypass graft) 05/09/2015   Unspecified atrial flutter (HCC) 05/09/2015    PCP: Jayne Mews, MD   REFERRING PROVIDER: Jayne Mews, MD   REFERRING DIAG:  R26.81 (ICD-10-CM) - Unsteadiness on feet R26.89 (ICD-10-CM) - Other abnormalities of gait and mobility  THERAPY DIAG:  Unsteadiness on feet  Ataxic gait  Muscle weakness (generalized)  RATIONALE FOR EVALUATION AND TREATMENT: Rehabilitation  ONSET DATE: Chronic  NEXT MD VISIT: 12/16/23   SUBJECTIVE:  SUBJECTIVE STATEMENT: Pt notes no recent falls or near falls, he does feel like he is getting stronger.  Eval: Pt reports how he feels depends on how much sleep he gets.  Arrives to PT with RW but states he also has a 4WW and hiking poles which he uses intermixed. His wife feels like he has been more unsteady of late.  He notes some R knee pain a few months back but now resolved.  Works with Systems analyst 2x/wk - STS, dumbbells, planks, etc.  Sees a chiropractor for his back 1x/month.  Pt accompanied by: self  PAIN: Are you having pain? No  PERTINENT HISTORY:  Chronic spinal cerebellar ataxia, neuropathy, osteoporosis, R sided pelvic fracture s/p fall 05/2020, MI/CAD s/p CABG x 4, Aflutter, GERD, B shoulder surgery/RCR, L carpal tunnel release, OSA  PRECAUTIONS: Fall  RED FLAGS: None  WEIGHT BEARING RESTRICTIONS: No  FALLS:  Has patient fallen in last 6 months? No  LIVING ENVIRONMENT: Lives with: lives with their family Lives in: House/apartment Stairs: Yes: Internal: 14 steps; on right going up and on left going up and External: 1 or 3 steps; on right going up and on left going up Has following equipment at home: Environmental consultant - 2 wheeled, Environmental consultant - 4  wheeled, shower chair, Grab bars, and hiking poles  OCCUPATION: Retired  PLOF: Independent, Independent with community mobility with device, and Leisure: community outings with friends, walking loops in driveway, driving with son (adult CP); works with Systems analyst 2x/wk - STS, dumbbells, planks, etc   PATIENT GOALS: "To feel more confident walking with the walking sticks."   OBJECTIVE: (objective measures completed at initial evaluation unless otherwise dated)  DIAGNOSTIC FINDINGS:  06/22/23 - XR L toes (Left great toe skin breakdown) IMPRESSION:  Mild soft tissue swelling. No radiographic evidence of osteomyelitis.  Sonogram negative for DVT  COGNITION: Overall cognitive status: Within functional limits for tasks assessed   SENSATION: B LE neuropathy - predominantly in B feet  COORDINATION: Gross motor mild impairment B LE  EDEMA:  Intermittent distal LE edema  POSTURE:  No Significant postural limitations  MUSCLE LENGTH: Hamstrings: severe tight B ITB: mod tight L>R Piriformis: mod/severe tight B Hip flexors: mod tight B Quads: mild tight B Heelcord:   LOWER EXTREMITY ROM:    Grossly WFL other than restrictions related to above muscle tightness  LOWER EXTREMITY MMT:    MMT Right eval Left eval R 08/10/23 L 08/10/23  Hip flexion 4+ 4 5 5   Hip extension 4 4 4+ 4+  Hip abduction 4 4 4 4   Hip adduction 4+ 4+ 4+ 5  Hip internal rotation 4+ 4+ 5 5  Hip external rotation 4 4 4+ 4+  Knee flexion 5 5 5 5   Knee extension 5 5 5 5   Ankle dorsiflexion 4+ 4 5 4+  Ankle plantarflexion      Ankle inversion      Ankle eversion      (Blank rows = not tested)  BED MOBILITY:  Independent  TRANSFERS: Assistive device utilized: Environmental consultant - 2 wheeled  Sit to stand: SBA Stand to sit: SBA Chair to chair:    Floor:     GAIT: Distance walked: clinic distances Assistive device utilized: Environmental consultant - 2 wheeled Level of assistance: Modified independence and SBA Gait pattern:  step to pattern, decreased hip/knee flexion- Right, decreased hip/knee flexion- Left, ataxic, and trunk flexed Comments:   RAMP: Level of Assistance:  NT Assistive device utilized:    Ramp Comments:  CURB:  Level of Assistance:  NT Assistive device utilized:    Curb Comments:   STAIRS:  Level of Assistance:  NT  Stair Negotiation Technique:   Number of Stairs:    Height of Stairs:   Comments:   FUNCTIONAL TESTS:  5 times sit to stand: 16.47 sec w/o UE assist Timed up and go (TUG): 20.63 sec with RW 10 meter walk test: 32.68 sec with RW; 1.00 ft/sec with RW  Berg Balance Scale: 26/56; Scores of <36 indicate high risk for falls (close to 100%) - 07/19/23 Dynamic Gait Index: 10/24; Scores of 19 or less are predictive of falls in older community living adults - 07/19/23 MCTSIB: Condition 1: Avg of 3 trials: 30 sec, Condition 2: Avg of 3 trials: 8.67 sec, Condition 3: Avg of 3 trials: 30 sec, Condition 4: Avg of 3 trials: 4 sec, and Total Score: 72.67/120  PATIENT SURVEYS:  ABC scale 360 / 1600 = 22.5 %; <50% indicates low level of physical functioning and risk for recurrent falls   TODAY'S TREATMENT:  08/23/23 NEUROMUSCULAR RE-EDUCATION: To improve proprioception, balance, and kinesthesia. Nustep L6x39min UE/LE- for reciprocal arm motion With counter support:  Toe taps to 9' stool finger touch support 2x10- more difficult supported on L LE  Stepping up and over on yoga block x 10 bil- unstable when stepping with RLE  Side steps RTB at ankles 3x10" down and back Standing with UE support on green pball: Fwd and lateral toe taps to floor 5x bil Seated pallof press GTB x 10  Seated shoulder extension GTB x 10   08/16/23 NEUROMUSCULAR RE-EDUCATION: To improve proprioception, balance, and kinesthesia. Nustep L6x19min UE/LE- for reciprocal arm motion Alt fwd stepping x 10 bil Alt retro stepping x 10 bil Stepping hip up and over x 10 bil Standing bird dog 2 x 10 bil Toe taps to two  yoga blocks on top of each other 2x10 Stepping up and over on yoga block x 10 bil- unstable when stepping with RLE  08/10/23 THERAPEUTIC EXERCISE: To improve strength and endurance.  Demonstration, verbal and tactile cues throughout for technique.  Rec bike - L3 x 6 min  THERAPEUTIC ACTIVITIES: To improve functional performance.  Demonstration, verbal and tactile cues throughout for technique. 5xSTS = 14.09 sec LE MMT  GAIT TRAINING: To improve safety with hiking poles and prepare to wean to LRAD. 400 ft with B hiking poles and CGA of PT - cues to slow pace and for proper sequencing of hiking poles, advancing poles further forward so that he is not walking past poles with steps   NEUROMUSCULAR RE-EDUCATION: To improve balance, coordination, kinesthesia, posture, proprioception, reduce fall risk, and amplitude of movement. Standing between 2 chairs with hands on backs of chairs: Alt fwd step with weight shift onto fwd leg x 10 focusing on upright posture and trunk extension Alt backward step with weight shift onto fwd leg x 10 with slight trunk flexion for counterbalance to reduce risk for posterior LOB Unilateral step-through fwd and back x 10 bil   08/05/23 THERAPEUTIC EXERCISE: To improve strength, endurance, ROM, and flexibility.  Demonstration, verbal and tactile cues throughout for technique. NuStep L6 x UE/LE Seated hamstring stretch x 30" bil Seated lunge hip flexor stretch x 30" bil Seated table piriformis stretch x 30" bil Knee flexion 40lb 2x10 Knee extension 30lb 2x10  NEUROMUSCULAR RE-EDUCATION: To improve proprioception, balance, and kinesthesia. Squats using ladder 2x10 Standing bird dog x 10 bil Standing hip up and over  x 10 bil Fwd step up and over x 10 bil  5xSTS   08/03/23 THERAPEUTIC EXERCISE: To improve strength, endurance, ROM, and flexibility.  Demonstration, verbal and tactile cues throughout for technique. NuStep L6 x UE/LE  SELF CARE: Provided  education to promote safe home environment.  Reviewed the "Check for Safety - Home Fall Prevention Checklist for Older Adults" to help identify fall risk hazards in the home along with strategies to reduce fall risk at home   NEUROMUSCULAR RE-EDUCATION: To improve balance, coordination, kinesthesia, and proprioception. Corner balance progression with narrow BOS on firm surface with arms at sides and arms crossed on chest            Eyes open: static stance x 30 sec each with arms at sides and arms crossed on chest horiz head turns x 5 vertical head nods x 5 trunk rotation x 5 Eyes closed: static stance x 15 sec - arms at side - did w/o leaning on wall or PT Corner balance progression with partial tandem on firm surface with arms at sides and arms crossed on chest            Eyes open: static balance x 30 sec each with arms at sides and arms crossed on chest horiz head turns x 5 vertical head nods x 5 trunk rotation x 5 Standing Hip ABD w/ 2lb weights around ankle in RW, chair behind pt - found too easy Standing Hip ABD, EXT & marching w/ 3lb weights around ankle in RW - PT provide close guarding; more challenging & appropriate x 10 B   PATIENT EDUCATION:  Education details: continue with current HEP  Person educated: Patient Education method: Explanation Education comprehension: verbalized understanding  HOME EXERCISE PROGRAM: Access Code: [email or web address] URL: [email or web address] Date: 08/03/2023 Prepared by: Talia Joseph-Greene  Exercises - Seated Hamstring Stretch  - 1-2 x daily - 7 x weekly - 3 reps - 30 sec hold - Seated Table Piriformis Stretch  - 1-2 x daily - 7 x weekly - 3 reps - 30 sec hold - Seated Hip Flexor Stretch  - 1-2 x daily - 7 x weekly - 3 reps - 30 sec hold - Heel Raises with Counter Support  - 1 x daily - 7 x weekly - 3 sets - 10 reps - Standing Hip Abduction with Counter Support  - 1 x daily - 7 x weekly - 3 sets - 10 reps - Standing Hip  Extension with Counter Support  - 1 x daily - 7 x weekly - 3 sets - 10 reps - Standing March with Counter Support  - 1 x daily - 7 x weekly - 3 sets - 10 reps  Patient Education - Check for Safety  ASSESSMENT:  CLINICAL IMPRESSION: Advanced work on [employer], stepping and WS interventions also working on balance to improve patient's stability and proprioceptive sense. He definitely shows more difficulty with having to support himself on his LLE. Ryo will benefit from continued skilled PT to address ongoing deficits to improve mobility and activity tolerance to help reach maximal level of functional independence.   OBJECTIVE IMPAIRMENTS: Abnormal gait, decreased activity tolerance, decreased balance, decreased coordination, decreased endurance, decreased knowledge of use of DME, decreased mobility, difficulty walking, decreased ROM, decreased strength, decreased safety awareness, impaired perceived functional ability, impaired flexibility, impaired sensation, improper body mechanics, and postural dysfunction.   ACTIVITY LIMITATIONS: carrying, lifting, bending, standing, squatting, stairs, transfers, bathing, dressing, reach over head, locomotion level, and caring for others  PARTICIPATION LIMITATIONS: meal prep, cleaning, laundry, driving, shopping, community activity, and yard work  PERSONAL FACTORS: Age, Past/current experiences, Time since onset of injury/illness/exacerbation, and 3+ comorbidities: Chronic spinal cerebellar ataxia, neuropathy, osteoporosis, R sided pelvic fracture s/p fall 05/2020, MI/CAD s/p CABG x 4, Aflutter, GERD, B shoulder surgery/RCR, L carpal tunnel release, OSA  are also affecting patient's functional outcome.   REHAB POTENTIAL: Good  CLINICAL DECISION MAKING: Unstable/unpredictable  EVALUATION COMPLEXITY: High   GOALS: Goals reviewed with patient? Yes  SHORT TERM GOALS: Target date: 08/09/2023  Patient will be independent with initial HEP to improve outcomes and  carryover.  Baseline:  Goal status: MET - 08/05/23  2.  Patient will be educated on strategies to decrease risk of falls.  Baseline:  Goal status: MET - 08/03/23 - Educated on Fall Prevention  3.  Patient will demonstrate decreased 5xSTS time to </= 14.8 sec to decrease risk for falls with transitional mobility. Baseline: 16.47 sec Goal status: MET - 08/10/23 - 14.09 sec  LONG TERM GOALS: Target date: 09/06/2023  Patient will be independent with advanced/ongoing HEP to facilitate ability to maintain/progress functional gains from skilled physical therapy services. Baseline:  Goal status: IN PROGRESS  2.  Patient will be able to ambulate 600' with LRAD on variable surfaces with good safety to access community.  Baseline:  Goal status: IN PROGRESS  3.  Patient will be able to step up/down curb safely with LRAD for safety with community ambulation.  Baseline:  Goal status: IN PROGRESS   4.  Patient will demonstrate improved B LE strength to >/= 4+/5 for improved stability and ease of mobility . Baseline: Refer to above LE MMT table Goal status: IN PROGRESS - 08/10/23 - met except B hip ABD 4/5   5.  Patient will improve TUG time to </= 15 seconds with walking sticks or LRAD for improved efficiency and safety with transfers. Baseline: 20.63 sec with RW Goal status: IN PROGRESS   6.  Patient will demonstrate gait speed of >/= 1.8 ft/sec (0.55 m/s) with walking sticks or LRAD to be a safe limited community ambulator with decreased risk for recurrent falls.  Baseline: 1.00 ft/sec Goal status: IN PROGRESS  7.  Patient will improve Berg score by at least 8 points to improve safety and stability with ADLs in standing and reduce risk for falls. (MCID= 8 points)  Baseline: 26/56 (07/19/23) Goal status: IN PROGRESS  8.  Patient will demonstrate at least 3 point improvement on DGI to improve gait stability and reduce risk for falls. Baseline: 10/24 (07/19/23) Goal status: IN PROGRESS  9.  Patient will report >/= 40% on ABC scale to demonstrate improved balance confidence with functional mobility and gait. Baseline: 360 / 1600 = 22.5 % Goal status: IN PROGRESS   PLAN:  PT FREQUENCY: 2x/week  PT DURATION: 8 weeks  PLANNED INTERVENTIONS: 97164- PT Re-evaluation, 97110-Therapeutic exercises, 97530- Therapeutic activity, 97112- Neuromuscular re-education, 97535- Self Care, [id number]- Manual therapy, [phone]- Gait training, Patient/Family education, Balance training, Stair training, Taping, Dry Needling, DME instructions, Cryotherapy, and Moist heat  PLAN FOR NEXT SESSION:  Increase ankle weights (based on pt's response) to LE strengthening and static balance and update HEP as indicated; stepping/weight shifting exercises   Samuella Crocker, PTA 08/23/2023, 3:08 PM

## 2023-08-24 ENCOUNTER — Encounter: Payer: Self-pay | Admitting: Physical Therapy

## 2023-08-26 ENCOUNTER — Encounter: Admitting: Physical Therapy

## 2023-08-30 ENCOUNTER — Encounter: Payer: Self-pay | Admitting: Physical Therapy

## 2023-08-30 ENCOUNTER — Ambulatory Visit: Attending: Family Medicine | Admitting: Physical Therapy

## 2023-08-30 DIAGNOSIS — R2681 Unsteadiness on feet: Secondary | ICD-10-CM | POA: Insufficient documentation

## 2023-08-30 DIAGNOSIS — R260 Ataxic gait: Secondary | ICD-10-CM | POA: Diagnosis present

## 2023-08-30 DIAGNOSIS — M6281 Muscle weakness (generalized): Secondary | ICD-10-CM | POA: Insufficient documentation

## 2023-08-30 NOTE — Therapy (Addendum)
 OUTPATIENT PHYSICAL THERAPY TREATMENT / RECERTIFICATION  Progress Note  Reporting Period 07/12/2023 to 08/30/2023   See note below for Objective Data and Assessment of Progress/Goals.     Patient Name: Andre [name] MRN: [id number] DOB:[birth year], 83 y.o., male Today's Date: 08/30/2023   END OF SESSION:  PT End of Session - 08/30/23 1402     Visit Number 10    Date for PT Re-Evaluation 10/18/23    Authorization Type [employer]    Progress Note Due on Visit 20    PT Start Time 1402    PT Stop Time 1447    PT Time Calculation (min) 45 min    Activity Tolerance Patient tolerated treatment well    Behavior During Therapy WFL for tasks assessed/performed                      Past Medical History:  Diagnosis Date   Carpal tunnel syndrome    Cerebellar ataxia (HCC)    spinal cerebellar ataxia   Dyslipidemia    H/O rotator cuff surgery    Obstructive sleep apnea    Past Surgical History:  Procedure Laterality Date   BYPASS GRAFT AORTA TO AORTA     quadruple bypass   CARPAL TUNNEL RELEASE Left    EYE SURGERY Bilateral    SHOULDER SURGERY     bilateral   TONSILLECTOMY     VASECTOMY     Patient Active Problem List   Diagnosis Date Noted   Osteoporosis, senile 08/27/2022   Neuropathy 12/12/2018   Cerebellar ataxia (HCC) 12/12/2018   Vitreous membranes and strands 10/20/2018   Bilateral cold feet 04/07/2017   Osteopenia of multiple sites 04/07/2017   Vitamin D deficiency 04/07/2017   CAD in native artery 03/09/2017   Old MI (myocardial infarction) 03/09/2017   Carpal tunnel syndrome, 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   Ulnar neuropathy at elbow, 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   PVC's (premature ventricular contractions) 10/09/2015   Chronic dryness of both eyes 09/26/2015   Hypermetropia 09/26/2015   Presbyopia 09/26/2015   Regular astigmatism 09/26/2015   Hypermetropia of both eyes 09/26/2015   Senile nuclear sclerosis 09/26/2015   Stationary peripheral pterygium of right  eye 09/26/2015   Unspecified esotropia 09/26/2015   Ventral hernia without obstruction or gangrene 07/30/2015   Dyslipidemia 05/09/2015   OSA (obstructive sleep apnea) 05/09/2015   S/P CABG (coronary artery bypass graft) 05/09/2015   Unspecified atrial flutter (HCC) 05/09/2015    PCP: Jayne Mews, MD   REFERRING PROVIDER: Jayne Mews, MD   REFERRING DIAG:  R26.81 (ICD-10-CM) - Unsteadiness on feet R26.89 (ICD-10-CM) - Other abnormalities of gait and mobility  THERAPY DIAG:  Unsteadiness on feet  Ataxic gait  Muscle weakness (generalized)  RATIONALE FOR EVALUATION AND TREATMENT: Rehabilitation  ONSET DATE: Chronic  NEXT MD VISIT: 12/16/23   SUBJECTIVE:  SUBJECTIVE STATEMENT: Pt reports he feels like he is getting stronger and is able to sleep better.  Balance seems to fluctuate - better with good night's sleep.  Eval: Pt reports how he feels depends on how much sleep he gets.  Arrives to PT with RW but states he also has a 4WW and hiking poles which he uses intermixed. His wife feels like he has been more unsteady of late.  He notes some R knee pain a few months back but now resolved.  Works with Systems analyst 2x/wk - STS, dumbbells, planks, etc.  Sees a chiropractor for his back 1x/month.  Pt accompanied by: self  PAIN: Are you having pain? No  PERTINENT HISTORY:  Chronic spinal cerebellar ataxia, neuropathy, osteoporosis, R sided pelvic fracture s/p fall 05/2020, MI/CAD s/p CABG x 4, Aflutter, GERD, B shoulder surgery/RCR, L carpal tunnel release, OSA  PRECAUTIONS: Fall  RED FLAGS: None  WEIGHT BEARING RESTRICTIONS: No  FALLS:  Has patient fallen in last 6 months? No  LIVING ENVIRONMENT: Lives with: lives with their family Lives in:  House/apartment Stairs: Yes: Internal: 14 steps; on right going up and on left going up and External: 1 or 3 steps; on right going up and on left going up Has following equipment at home: Environmental consultant - 2 wheeled, Environmental consultant - 4 wheeled, shower chair, Grab bars, and hiking poles  OCCUPATION: Retired  PLOF: Independent, Independent with community mobility with device, and Leisure: community outings with friends, walking loops in driveway, driving with son (adult CP); works with Systems analyst 2x/wk - STS, dumbbells, planks, etc   PATIENT GOALS: "To feel more confident walking with the walking sticks."   OBJECTIVE: (objective measures completed at initial evaluation unless otherwise dated)  DIAGNOSTIC FINDINGS:  06/22/23 - XR L toes (Left great toe skin breakdown) IMPRESSION:  Mild soft tissue swelling. No radiographic evidence of osteomyelitis.  Sonogram negative for DVT  COGNITION: Overall cognitive status: Within functional limits for tasks assessed   SENSATION: B LE neuropathy - predominantly in B feet  COORDINATION: Gross motor mild impairment B LE  EDEMA:  Intermittent distal LE edema  POSTURE:  No Significant postural limitations  MUSCLE LENGTH: Hamstrings: severe tight B ITB: mod tight L>R Piriformis: mod/severe tight B Hip flexors: mod tight B Quads: mild tight B Heelcord:   LOWER EXTREMITY ROM:    Grossly WFL other than restrictions related to above muscle tightness  LOWER EXTREMITY MMT:    MMT Right eval Left eval R 08/10/23 L 08/10/23  Hip flexion 4+ 4 5 5   Hip extension 4 4 4+ 4+  Hip abduction 4 4 4 4   Hip adduction 4+ 4+ 4+ 5  Hip internal rotation 4+ 4+ 5 5  Hip external rotation 4 4 4+ 4+  Knee flexion 5 5 5 5   Knee extension 5 5 5 5   Ankle dorsiflexion 4+ 4 5 4+  Ankle plantarflexion      Ankle inversion      Ankle eversion      (Blank rows = not tested)  BED MOBILITY:  Independent  TRANSFERS: Assistive device utilized: Environmental consultant - 2 wheeled  Sit to  stand: SBA Stand to sit: SBA Chair to chair:   Floor:    GAIT: Distance walked: clinic distances Assistive device utilized: Environmental consultant - 2 wheeled Level of assistance: Modified independence and SBA Gait pattern: step to pattern, decreased hip/knee flexion- Right, decreased hip/knee flexion- Left, ataxic, and trunk flexed Comments:   RAMP: Level of Assistance: NT Assistive  device utilized:   Ramp Comments:   CURB:  Level of Assistance: NT Assistive device utilized:   Curb Comments:   STAIRS:  Level of Assistance: NT  Stair Negotiation Technique:   Number of Stairs:    Height of Stairs:   Comments:   FUNCTIONAL TESTS:  5 times sit to stand: 16.47 sec w/o UE assist Timed up and go (TUG): 20.63 sec with RW 10 meter walk test: 32.68 sec with RW; 1.00 ft/sec with RW  Berg Balance Scale: 26/56; Scores of <36 indicate high risk for falls (close to 100%) - 07/19/23 Dynamic Gait Index: 10/24; Scores of 19 or less are predictive of falls in older community living adults - 07/19/23 MCTSIB: Condition 1: Avg of 3 trials: 30 sec, Condition 2: Avg of 3 trials: 8.67 sec, Condition 3: Avg of 3 trials: 30 sec, Condition 4: Avg of 3 trials: 4 sec, and Total Score: 72.67/120  08/10/23: 5xSTS = 14.09 sec  08/30/23: TUG = 13.0 sec, >13.5 sec is predictive of falls = 12.16 sec Gait speed = 2.70 ft/sec with RW; >2.62 ft/sec indicates community ambulator Berg = 32/56, <36 = High risk for falls (close to 100%)  DGI = 13/24, scores of 19 or less are predictive of falls in older community living adults  PATIENT SURVEYS:  ABC scale 360 / 1600 = 22.5 %; <50% indicates low level of physical functioning and risk for recurrent falls 08/30/23 - 08/30/23 - 570 / 1600 = 35.6 %   TODAY'S TREATMENT:   08/30/23 THERAPEUTIC EXERCISE: To improve strength and endurance.  Demonstration, verbal and tactile cues throughout for technique.  Rec Bike - L3 x 6 min  PHYSICAL PERFORMANCE TEST or MEASUREMENT: TUG =  13.0 sec, >13.5 sec is predictive of falls = 12.16 sec Gait speed = 2.70 ft/sec with RW; >2.62 ft/sec indicates community ambulator Berg = 32/56, <36 = High risk for falls (close to 100%)  DGI = 13/24, scores of 19 or less are predictive of falls in older community living adults ABC scale = 570 / 1600 = 35.6 %, < 50% indicates low level of physical functioning  Berg Balance Test   Sit to Stand Able to stand without using hands and stabilize independently    Standing Unsupported Able to stand 2 minutes with supervision    Sitting with Back Unsupported but Feet Supported on Floor or Stool Able to sit safely and securely 2 minutes    Stand to Sit Sits safely with minimal use of hands    Transfers Able to transfer safely, definite need of hands    Standing Unsupported with Eyes Closed Able to stand 3 seconds    Standing Unsupported with Feet Together Needs help to attain position but able to stand for 30 seconds with feet together    From Standing, Reach Forward with Outstretched Arm Can reach forward >12 cm safely (5")    From Standing Position, Pick up Object from Floor Able to pick up shoe, needs supervision    From Standing Position, Turn to Look Behind Over each Shoulder Needs supervision when turning    Turn 360 Degrees Needs close supervision or verbal cueing    Standing Unsupported, Alternately Place Feet on Step/Stool Able to complete >2 steps/needs minimal assist    Standing Unsupported, One Foot in Front Needs help to step but can hold 15 seconds    Standing on One Leg Tries to lift leg/unable to hold 3 seconds but remains standing independently  Total Score 32    Berg comment: <36 = High risk for falls (close to 100%)      Dynamic Gait Index   Level Surface Mild Impairment    Change in Gait Speed Mild Impairment    Gait with Horizontal Head Turns Mild Impairment    Gait with Vertical Head Turns Mild Impairment    Gait and Pivot Turn Moderate Impairment    Step Over  Obstacle Mild Impairment    Step Around Obstacles Mild Impairment    Steps Severe Impairment    Total Score 13    DGI comment: Scores of 19 or less are predictive of falls in older community living adults       08/23/23 NEUROMUSCULAR RE-EDUCATION: To improve proprioception, balance, and kinesthesia. Nustep L6x9min UE/LE- for reciprocal arm motion With counter support:  Toe taps to 9' stool finger touch support 2x10- more difficult supported on L LE  Stepping up and over on yoga block x 10 bil- unstable when stepping with RLE  Side steps RTB at ankles 3x10" down and back Standing with UE support on green pball: Fwd and lateral toe taps to floor 5x bil Seated pallof press GTB x 10  Seated shoulder extension GTB x 10    08/16/23 NEUROMUSCULAR RE-EDUCATION: To improve proprioception, balance, and kinesthesia. Nustep L6x16min UE/LE- for reciprocal arm motion Alt fwd stepping x 10 bil Alt retro stepping x 10 bil Stepping hip up and over x 10 bil Standing bird dog 2 x 10 bil Toe taps to two yoga blocks on top of each other 2x10 Stepping up and over on yoga block x 10 bil- unstable when stepping with RLE   PATIENT EDUCATION:  Education details: progress with PT, ongoing PT POC, and continue with current HEP  Person educated: Patient Education method: Explanation Education comprehension: verbalized understanding  HOME EXERCISE PROGRAM: Access Code: [email or web address] URL: [email or web address] Date: 08/03/2023 Prepared by: Talia Joseph-Greene  Exercises - Seated Hamstring Stretch  - 1-2 x daily - 7 x weekly - 3 reps - 30 sec hold - Seated Table Piriformis Stretch  - 1-2 x daily - 7 x weekly - 3 reps - 30 sec hold - Seated Hip Flexor Stretch  - 1-2 x daily - 7 x weekly - 3 reps - 30 sec hold - Heel Raises with Counter Support  - 1 x daily - 7 x weekly - 3 sets - 10 reps - Standing Hip Abduction with Counter Support  - 1 x daily - 7 x weekly - 3 sets - 10 reps - Standing Hip  Extension with Counter Support  - 1 x daily - 7 x weekly - 3 sets - 10 reps - Standing March with Counter Support  - 1 x daily - 7 x weekly - 3 sets - 10 reps  Patient Education - Check for Safety  ASSESSMENT:  CLINICAL IMPRESSION: Andre [name] reports he is feeling better since starting PT - stronger and sleeping better, but balance still fluctuates.  His balance confidence has improved from 22.5% to 35.6% but still indicates a low level of physical functioning.  He has demonstrated gains across all standardized balance testing.  Gait speed has improved from 1.00 ft/sec to 2.70 ft/sec with RW indicating progression from household ambulator to community ambulator with decreased risk for recurrent falls.  TUG improved from 20.63 to 13 seconds with RW, placing him below the 13.5 second threshold for high risk of falls.  Andre [name] has improved from 26/56 to 32/56, however still indicates  a high risk for falls.  DGI has improved from 10/24 to 13/24, but also still indicates a high risk for falls.  Andre [name] is progressing well towards his PT goals and would like to continue with PT in hopes of being able to resume using hiking poles for AD with ambulation.  Andre [name] will benefit from continued skilled PT to address ongoing strength, coordination and balance deficits to improve mobility and activity tolerance with decreased risk for falls to help reach maximal level of functional independence, therefore will recommend recert for additional 2x/wk for up to 6-7 weeks.    OBJECTIVE IMPAIRMENTS: Abnormal gait, decreased activity tolerance, decreased balance, decreased coordination, decreased endurance, decreased knowledge of use of DME, decreased mobility, difficulty walking, decreased ROM, decreased strength, decreased safety awareness, impaired perceived functional ability, impaired flexibility, impaired sensation, improper body mechanics, and postural dysfunction.   ACTIVITY LIMITATIONS: carrying, lifting, bending, standing,  squatting, stairs, transfers, bathing, dressing, reach over head, locomotion level, and caring for others  PARTICIPATION LIMITATIONS: meal prep, cleaning, laundry, driving, shopping, community activity, and yard work  PERSONAL FACTORS: Age, Past/current experiences, Time since onset of injury/illness/exacerbation, and 3+ comorbidities: Chronic spinal cerebellar ataxia, neuropathy, osteoporosis, R sided pelvic fracture s/p fall 05/2020, MI/CAD s/p CABG x 4, Aflutter, GERD, B shoulder surgery/RCR, L carpal tunnel release, OSA are also affecting patient's functional outcome.   REHAB POTENTIAL: Good  CLINICAL DECISION MAKING: Unstable/unpredictable  EVALUATION COMPLEXITY: High   GOALS: Goals reviewed with patient? Yes  SHORT TERM GOALS: Target date: 08/09/2023  Patient will be independent with initial HEP to improve outcomes and carryover.  Baseline:  Goal status: MET - 08/05/23  2.  Patient will be educated on strategies to decrease risk of falls.  Baseline:  Goal status: MET - 08/03/23 - Educated on Fall Prevention  3.  Patient will demonstrate decreased 5xSTS time to </= 14.8 sec to decrease risk for falls with transitional mobility. Baseline: 16.47 sec Goal status: MET - 08/10/23 - 14.09 sec  LONG TERM GOALS: Target date: 09/06/2023, extended to 10/18/2023   Patient will be independent with advanced/ongoing HEP to facilitate ability to maintain/progress functional gains from skilled physical therapy services. Baseline:  Goal status: IN PROGRESS - 08/30/23 - met for current HEP  2.  Patient will be able to ambulate 600' with LRAD on variable surfaces with good safety to access community.  Baseline:  Goal status: IN PROGRESS   3.  Patient will be able to step up/down curb safely with LRAD for safety with community ambulation.  Baseline:  Goal status: IN PROGRESS   4.  Patient will demonstrate improved B LE strength to >/= 4+/5 for improved stability and ease of mobility . Baseline:  Refer to above LE MMT table Goal status: IN PROGRESS - 08/10/23 - met except B hip ABD 4/5   5.  Patient will improve TUG time to </= 15 seconds with walking sticks or LRAD for improved efficiency and safety with transfers. Baseline: 20.63 sec with RW Goal status: MET - 08/30/23 - 13.0 sec with RW  6.  Patient will demonstrate gait speed of >/= 1.8 ft/sec (0.55 m/s) with walking sticks or LRAD to be a safe limited community ambulator with decreased risk for recurrent falls.  Baseline: 1.00 ft/sec Goal status: PARTIALLY MET - 08/30/23 - 2.70 ft/sec with RW  7.  Patient will improve Berg score by at least 8 points to improve safety and stability with ADLs in standing and reduce risk for falls. (MCID= 8 points)  Baseline: 26/56 (07/19/23) Goal status: IN PROGRESS - 08/30/23 - 32/56  8.  Patient will demonstrate at least 3 point improvement on DGI to improve gait stability and reduce risk for falls. Baseline: 10/24 (07/19/23) Goal status: MET - 08/30/23 - 13/24  9. Patient will report >/= 40% on ABC scale to demonstrate improved balance confidence with functional mobility and gait. Baseline: 360 / 1600 = 22.5 % Goal status: IN PROGRESS - 08/30/23 - 570 / 1600 = 35.6 %   PLAN:  PT FREQUENCY: 2x/week  PT DURATION: 6-7 weeks  PLANNED INTERVENTIONS: 97164- PT Re-evaluation, 97110-Therapeutic exercises, 97530- Therapeutic activity, 97112- Neuromuscular re-education, 97535- Self Care, [id number]- Manual therapy, [phone]- Gait training, Patient/Family education, Balance training, Stair training, Taping, Dry Needling, DME instructions, Cryotherapy, and Moist heat  PLAN FOR NEXT SESSION: Assess LTG's #2 & 3, reassess LE MMT (LTG #4); increase ankle weights (based on pt's response) to LE strengthening and static balance and update HEP as indicated; stepping/weight shifting exercises; work towards gait training with hiking poles   Francisco Irving, PT 08/30/2023, 6:55 PM

## 2023-09-02 ENCOUNTER — Ambulatory Visit

## 2023-09-02 DIAGNOSIS — R260 Ataxic gait: Secondary | ICD-10-CM

## 2023-09-02 DIAGNOSIS — R2681 Unsteadiness on feet: Secondary | ICD-10-CM

## 2023-09-02 DIAGNOSIS — M6281 Muscle weakness (generalized): Secondary | ICD-10-CM

## 2023-09-02 NOTE — Therapy (Signed)
 OUTPATIENT PHYSICAL THERAPY TREATMENT     Patient Name: Andre [name] MRN: [id number] DOB:[birth year], 83 y.o., male Today's Date: 09/02/2023   END OF SESSION:  Andre [name] End of Session - 09/02/23 1504     Visit Number 11    Date for Andre [name] Re-Evaluation 10/18/23    Authorization Type [employer]    Progress Note Due on Visit 20    Andre [name] Start Time 1402    Andre [name] Stop Time 1447    Andre [name] Time Calculation (min) 45 min    Activity Tolerance Patient tolerated treatment well    Behavior During Therapy WFL for tasks assessed/performed                       Past Medical History:  Diagnosis Date   Carpal tunnel syndrome    Cerebellar ataxia (HCC)    spinal cerebellar ataxia   Dyslipidemia    H/O rotator cuff surgery    Obstructive sleep apnea    Past Surgical History:  Procedure Laterality Date   BYPASS GRAFT AORTA TO AORTA     quadruple bypass   CARPAL TUNNEL RELEASE Left    EYE SURGERY Bilateral    SHOULDER SURGERY     bilateral   TONSILLECTOMY     VASECTOMY     Patient Active Problem List   Diagnosis Date Noted   Osteoporosis, senile 08/27/2022   Neuropathy 12/12/2018   Cerebellar ataxia (HCC) 12/12/2018   Vitreous membranes and strands 10/20/2018   Bilateral cold feet 04/07/2017   Osteopenia of multiple sites 04/07/2017   Vitamin D deficiency 04/07/2017   CAD in native artery 03/09/2017   Old MI (myocardial infarction) 03/09/2017   Carpal tunnel syndrome, 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   Ulnar neuropathy at elbow, 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   PVC's (premature ventricular contractions) 10/09/2015   Chronic dryness of both eyes 09/26/2015   Hypermetropia 09/26/2015   Presbyopia 09/26/2015   Regular astigmatism 09/26/2015   Hypermetropia of both eyes 09/26/2015   Senile nuclear sclerosis 09/26/2015   Stationary peripheral pterygium of right eye 09/26/2015   Unspecified esotropia 09/26/2015   Ventral hernia without obstruction or gangrene 07/30/2015   Dyslipidemia 05/09/2015   OSA  (obstructive sleep apnea) 05/09/2015   S/P CABG (coronary artery bypass graft) 05/09/2015   Unspecified atrial flutter (HCC) 05/09/2015    PCP: Jayne Mews, MD   REFERRING PROVIDER: Jayne Mews, MD   REFERRING DIAG:  R26.81 (ICD-10-CM) - Unsteadiness on feet R26.89 (ICD-10-CM) - Other abnormalities of gait and mobility  THERAPY DIAG:  Unsteadiness on feet  Ataxic gait  Muscle weakness (generalized)  RATIONALE FOR EVALUATION AND TREATMENT: Rehabilitation  ONSET DATE: Chronic  NEXT MD VISIT: 12/16/23   SUBJECTIVE:  SUBJECTIVE STATEMENT: Andre [name] reports he is continuing to get stronger, denies any falls   Eval: Andre [name] reports how he feels depends on how much sleep he gets.  Arrives to Andre [name] with RW but states he also has a 4WW and hiking poles which he uses intermixed. His wife feels like he has been more unsteady of late.  He notes some R knee pain a few months back but now resolved.  Works with Systems analyst 2x/wk - STS, dumbbells, planks, etc.  Sees a chiropractor for his back 1x/month.  Andre [name] accompanied by: self  PAIN: Are you having pain? No  PERTINENT HISTORY:  Chronic spinal cerebellar ataxia, neuropathy, osteoporosis, R sided pelvic fracture s/p fall 05/2020, MI/CAD s/p CABG x 4, Aflutter, GERD, B shoulder surgery/RCR, L carpal tunnel release, OSA  PRECAUTIONS: Fall  RED FLAGS: None  WEIGHT BEARING RESTRICTIONS: No  FALLS:  Has patient fallen in last 6 months? No  LIVING ENVIRONMENT: Lives with: lives with their family Lives in: House/apartment Stairs: Yes: Internal: 14 steps; on right going up and on left going up and External: 1 or 3 steps; on right going up and on left going up Has following equipment at home: Environmental consultant - 2 wheeled, Environmental consultant - 4 wheeled, shower chair,  Grab bars, and hiking poles  OCCUPATION: Retired  PLOF: Independent, Independent with community mobility with device, and Leisure: community outings with friends, walking loops in driveway, driving with son (adult CP); works with Systems analyst 2x/wk - STS, dumbbells, planks, etc   PATIENT GOALS: "To feel more confident walking with the walking sticks."   OBJECTIVE: (objective measures completed at initial evaluation unless otherwise dated)  DIAGNOSTIC FINDINGS:  06/22/23 - XR L toes (Left great toe skin breakdown) IMPRESSION:  Mild soft tissue swelling. No radiographic evidence of osteomyelitis.  Sonogram negative for DVT  COGNITION: Overall cognitive status: Within functional limits for tasks assessed   SENSATION: B LE neuropathy - predominantly in B feet  COORDINATION: Gross motor mild impairment B LE  EDEMA:  Intermittent distal LE edema  POSTURE:  No Significant postural limitations  MUSCLE LENGTH: Hamstrings: severe tight B ITB: mod tight L>R Piriformis: mod/severe tight B Hip flexors: mod tight B Quads: mild tight B Heelcord:   LOWER EXTREMITY ROM:    Grossly WFL other than restrictions related to above muscle tightness  LOWER EXTREMITY MMT:    MMT Right eval Left eval R 08/10/23 L 08/10/23  Hip flexion 4+ 4 5 5   Hip extension 4 4 4+ 4+  Hip abduction 4 4 4 4   Hip adduction 4+ 4+ 4+ 5  Hip internal rotation 4+ 4+ 5 5  Hip external rotation 4 4 4+ 4+  Knee flexion 5 5 5 5   Knee extension 5 5 5 5   Ankle dorsiflexion 4+ 4 5 4+  Ankle plantarflexion      Ankle inversion      Ankle eversion      (Blank rows = not tested)  BED MOBILITY:  Independent  TRANSFERS: Assistive device utilized: Environmental consultant - 2 wheeled  Sit to stand: SBA Stand to sit: SBA Chair to chair:   Floor:    GAIT: Distance walked: clinic distances Assistive device utilized: Environmental consultant - 2 wheeled Level of assistance: Modified independence and SBA Gait pattern: step to pattern, decreased  hip/knee flexion- Right, decreased hip/knee flexion- Left, ataxic, and trunk flexed Comments:   RAMP: Level of Assistance: NT Assistive device utilized:   Ramp Comments:   CURB:  Level of Assistance: NT  Assistive device utilized:   Curb Comments:   STAIRS:  Level of Assistance: NT  Stair Negotiation Technique:   Number of Stairs:    Height of Stairs:   Comments:   FUNCTIONAL TESTS:  5 times sit to stand: 16.47 sec w/o UE assist Timed up and go (TUG): 20.63 sec with RW 10 meter walk test: 32.68 sec with RW; 1.00 ft/sec with RW  Berg Balance Scale: 26/56; Scores of <36 indicate high risk for falls (close to 100%) - 07/19/23 Dynamic Gait Index: 10/24; Scores of 19 or less are predictive of falls in older community living adults - 07/19/23 MCTSIB: Condition 1: Avg of 3 trials: 30 sec, Condition 2: Avg of 3 trials: 8.67 sec, Condition 3: Avg of 3 trials: 30 sec, Condition 4: Avg of 3 trials: 4 sec, and Total Score: 72.67/120  08/10/23: 5xSTS = 14.09 sec  08/30/23: TUG = 13.0 sec, >13.5 sec is predictive of falls = 12.16 sec Gait speed = 2.70 ft/sec with RW; >2.62 ft/sec indicates community ambulator Berg = 32/56, <36 = High risk for falls (close to 100%)  DGI = 13/24, scores of 19 or less are predictive of falls in older community living adults  PATIENT SURVEYS:  ABC scale 360 / 1600 = 22.5 %; <50% indicates low level of physical functioning and risk for recurrent falls 08/30/23 - 08/30/23 - 570 / 1600 = 35.6 %   TODAY'S TREATMENT:  09/02/23 THERAPEUTIC EXERCISE: To improve strength and endurance.  Demonstration, verbal and tactile cues throughout for technique.  Rec Bike - L3 x 6 min Gait for 650' with RW - ataxic gait  Standing with UE support on green pball: -hip abduction x 10  -hip extension x 10 -marching x 10 -4 way weight shifting green pball x 10 each way -modified bird dog x 5 with arm raise and toe tap posterior -Lateral toe taps x 10 B  08/30/23 THERAPEUTIC  EXERCISE: To improve strength and endurance.  Demonstration, verbal and tactile cues throughout for technique.  Rec Bike - L3 x 6 min  PHYSICAL PERFORMANCE TEST or MEASUREMENT: TUG = 13.0 sec, >13.5 sec is predictive of falls = 12.16 sec Gait speed = 2.70 ft/sec with RW; >2.62 ft/sec indicates community ambulator Berg = 32/56, <36 = High risk for falls (close to 100%)  DGI = 13/24, scores of 19 or less are predictive of falls in older community living adults ABC scale = 570 / 1600 = 35.6 %, < 50% indicates low level of physical functioning  Berg Balance Test   Sit to Stand Able to stand without using hands and stabilize independently    Standing Unsupported Able to stand 2 minutes with supervision    Sitting with Back Unsupported but Feet Supported on Floor or Stool Able to sit safely and securely 2 minutes    Stand to Sit Sits safely with minimal use of hands    Transfers Able to transfer safely, definite need of hands    Standing Unsupported with Eyes Closed Able to stand 3 seconds    Standing Unsupported with Feet Together Needs help to attain position but able to stand for 30 seconds with feet together    From Standing, Reach Forward with Outstretched Arm Can reach forward >12 cm safely (5")    From Standing Position, Pick up Object from Floor Able to pick up shoe, needs supervision    From Standing Position, Turn to Look Behind Over each Shoulder Needs supervision when turning  Turn 360 Degrees Needs close supervision or verbal cueing    Standing Unsupported, Alternately Place Feet on Step/Stool Able to complete >2 steps/needs minimal assist    Standing Unsupported, One Foot in Front Needs help to step but can hold 15 seconds    Standing on One Leg Tries to lift leg/unable to hold 3 seconds but remains standing independently    Total Score 32    Berg comment: <36 = High risk for falls (close to 100%)      Dynamic Gait Index   Level Surface Mild Impairment    Change in Gait  Speed Mild Impairment    Gait with Horizontal Head Turns Mild Impairment    Gait with Vertical Head Turns Mild Impairment    Gait and Pivot Turn Moderate Impairment    Step Over Obstacle Mild Impairment    Step Around Obstacles Mild Impairment    Steps Severe Impairment    Total Score 13    DGI comment: Scores of 19 or less are predictive of falls in older community living adults       08/23/23 NEUROMUSCULAR RE-EDUCATION: To improve proprioception, balance, and kinesthesia. Nustep L6x29min UE/LE- for reciprocal arm motion With counter support:  Toe taps to 9' stool finger touch support 2x10- more difficult supported on L LE  Stepping up and over on yoga block x 10 bil- unstable when stepping with RLE  Side steps RTB at ankles 3x10" down and back Standing with UE support on green pball: Fwd and lateral toe taps to floor 5x bil Seated pallof press GTB x 10  Seated shoulder extension GTB x 10  +  08/16/23 NEUROMUSCULAR RE-EDUCATION: To improve proprioception, balance, and kinesthesia. Nustep L6x37min UE/LE- for reciprocal arm motion Alt fwd stepping x 10 bil Alt retro stepping x 10 bil Stepping hip up and over x 10 bil Standing bird dog 2 x 10 bil Toe taps to two yoga blocks on top of each other 2x10 Stepping up and over on yoga block x 10 bil- unstable when stepping with RLE   PATIENT EDUCATION:  Education details: progress with Andre [name], ongoing Andre [name] POC, and continue with current HEP  Person educated: Patient Education method: Explanation Education comprehension: verbalized understanding  HOME EXERCISE PROGRAM: Access Code: [email or web address] URL: [email or web address] Date: 08/03/2023 Prepared by: Talia Joseph-Greene  Exercises - Seated Hamstring Stretch  - 1-2 x daily - 7 x weekly - 3 reps - 30 sec hold - Seated Table Piriformis Stretch  - 1-2 x daily - 7 x weekly - 3 reps - 30 sec hold - Seated Hip Flexor Stretch  - 1-2 x daily - 7 x weekly - 3 reps - 30 sec hold - Heel  Raises with Counter Support  - 1 x daily - 7 x weekly - 3 sets - 10 reps - Standing Hip Abduction with Counter Support  - 1 x daily - 7 x weekly - 3 sets - 10 reps - Standing Hip Extension with Counter Support  - 1 x daily - 7 x weekly - 3 sets - 10 reps - Standing March with Counter Support  - 1 x daily - 7 x weekly - 3 sets - 10 reps  Patient Education - Check for Safety  ASSESSMENT:  CLINICAL IMPRESSION: We progressed with more core stability exercises today. Andre [name] was pretty challenged with using the pball for UE support. Cues provided with all the interventions for upright posture, proper movement, and weight transfer through his LE's. He continues to show good  improvement and demo of exercises.  Christop will benefit from continued skilled Andre [name] to address ongoing strength, coordination and balance deficits to improve mobility and activity tolerance with decreased risk for falls to help reach maximal level of functional independence.   OBJECTIVE IMPAIRMENTS: Abnormal gait, decreased activity tolerance, decreased balance, decreased coordination, decreased endurance, decreased knowledge of use of DME, decreased mobility, difficulty walking, decreased ROM, decreased strength, decreased safety awareness, impaired perceived functional ability, impaired flexibility, impaired sensation, improper body mechanics, and postural dysfunction.   ACTIVITY LIMITATIONS: carrying, lifting, bending, standing, squatting, stairs, transfers, bathing, dressing, reach over head, locomotion level, and caring for others  PARTICIPATION LIMITATIONS: meal prep, cleaning, laundry, driving, shopping, community activity, and yard work  PERSONAL FACTORS: Age, Past/current experiences, Time since onset of injury/illness/exacerbation, and 3+ comorbidities: Chronic spinal cerebellar ataxia, neuropathy, osteoporosis, R sided pelvic fracture s/p fall 05/2020, MI/CAD s/p CABG x 4, Aflutter, GERD, B shoulder surgery/RCR, L carpal tunnel  release, OSA are also affecting patient's functional outcome.   REHAB POTENTIAL: Good  CLINICAL DECISION MAKING: Unstable/unpredictable  EVALUATION COMPLEXITY: High   GOALS: Goals reviewed with patient? Yes  SHORT TERM GOALS: Target date: 08/09/2023  Patient will be independent with initial HEP to improve outcomes and carryover.  Baseline:  Goal status: MET - 08/05/23  2.  Patient will be educated on strategies to decrease risk of falls.  Baseline:  Goal status: MET - 08/03/23 - Educated on Fall Prevention  3.  Patient will demonstrate decreased 5xSTS time to </= 14.8 sec to decrease risk for falls with transitional mobility. Baseline: 16.47 sec Goal status: MET - 08/10/23 - 14.09 sec  LONG TERM GOALS: Target date: 09/06/2023, extended to 10/18/2023   Patient will be independent with advanced/ongoing HEP to facilitate ability to maintain/progress functional gains from skilled physical therapy services. Baseline:  Goal status: IN PROGRESS - 08/30/23 - met for current HEP  2.  Patient will be able to ambulate 600' with LRAD on variable surfaces with good safety to access community.  Baseline:  Goal status: IN PROGRESS   3.  Patient will be able to step up/down curb safely with LRAD for safety with community ambulation.  Baseline:  Goal status: IN PROGRESS   4.  Patient will demonstrate improved B LE strength to >/= 4+/5 for improved stability and ease of mobility . Baseline: Refer to above LE MMT table Goal status: IN PROGRESS - 08/10/23 - met except B hip ABD 4/5   5.  Patient will improve TUG time to </= 15 seconds with walking sticks or LRAD for improved efficiency and safety with transfers. Baseline: 20.63 sec with RW Goal status: MET - 08/30/23 - 13.0 sec with RW  6.  Patient will demonstrate gait speed of >/= 1.8 ft/sec (0.55 m/s) with walking sticks or LRAD to be a safe limited community ambulator with decreased risk for recurrent falls.  Baseline: 1.00 ft/sec Goal status:  PARTIALLY MET - 08/30/23 - 2.70 ft/sec with RW  7.  Patient will improve Berg score by at least 8 points to improve safety and stability with ADLs in standing and reduce risk for falls. (MCID= 8 points)  Baseline: 26/56 (07/19/23) Goal status: IN PROGRESS - 08/30/23 - 32/56  8.  Patient will demonstrate at least 3 point improvement on DGI to improve gait stability and reduce risk for falls. Baseline: 10/24 (07/19/23) Goal status: MET - 08/30/23 - 13/24  9. Patient will report >/= 40% on ABC scale to demonstrate improved balance confidence with functional  mobility and gait. Baseline: 360 / 1600 = 22.5 % Goal status: IN PROGRESS - 08/30/23 - 570 / 1600 = 35.6 %   PLAN:  Andre [name] FREQUENCY: 2x/week  Andre [name] DURATION: 6-7 weeks  PLANNED INTERVENTIONS: [id number]- Andre [name] Re-evaluation, 97110-Therapeutic exercises, 97530- Therapeutic activity, 97112- Neuromuscular re-education, 97535- Self Care, [id number]- Manual therapy, [phone]- Gait training, Patient/Family education, Balance training, Stair training, Taping, Dry Needling, DME instructions, Cryotherapy, and Moist heat  PLAN FOR NEXT SESSION: Assess LTG's #2 & 3, reassess LE MMT (LTG #4); increase ankle weights (based on Andre [name]'s response) to LE strengthening and static balance and update HEP as indicated; stepping/weight shifting exercises; work towards gait training with hiking poles   [employer], PTA 09/02/2023, 3:05 PM

## 2023-09-06 ENCOUNTER — Ambulatory Visit: Admitting: Physical Therapy

## 2023-09-09 ENCOUNTER — Ambulatory Visit: Payer: Self-pay | Admitting: Physical Therapy

## 2023-09-09 ENCOUNTER — Encounter: Payer: Self-pay | Admitting: Physical Therapy

## 2023-09-09 DIAGNOSIS — R2681 Unsteadiness on feet: Secondary | ICD-10-CM | POA: Diagnosis not present

## 2023-09-09 DIAGNOSIS — R260 Ataxic gait: Secondary | ICD-10-CM

## 2023-09-09 DIAGNOSIS — M6281 Muscle weakness (generalized): Secondary | ICD-10-CM

## 2023-09-09 NOTE — Therapy (Signed)
 OUTPATIENT PHYSICAL THERAPY TREATMENT     Patient Name: Andre [name] MRN: [id number] DOB:12-Feb-1941, 83 y.o., male Today's Date: 09/09/2023   END OF SESSION:  PT End of Session - 09/09/23 1401     Visit Number 12    Date for PT Re-Evaluation 10/18/23    Authorization Type [employer]    Progress Note Due on Visit 20    PT Start Time 1317    PT Stop Time 1400    PT Time Calculation (min) 43 min    Activity Tolerance Patient tolerated treatment well    Behavior During Therapy WFL for tasks assessed/performed                        Past Medical History:  Diagnosis Date   Carpal tunnel syndrome    Cerebellar ataxia (HCC)    spinal cerebellar ataxia   Dyslipidemia    H/O rotator cuff surgery    Obstructive sleep apnea    Past Surgical History:  Procedure Laterality Date   BYPASS GRAFT AORTA TO AORTA     quadruple bypass   CARPAL TUNNEL RELEASE Left    EYE SURGERY Bilateral    SHOULDER SURGERY     bilateral   TONSILLECTOMY     VASECTOMY     Patient Active Problem List   Diagnosis Date Noted   Osteoporosis, senile 08/27/2022   Neuropathy 12/12/2018   Cerebellar ataxia (HCC) 12/12/2018   Vitreous membranes and strands 10/20/2018   Bilateral cold feet 04/07/2017   Osteopenia of multiple sites 04/07/2017   Vitamin D deficiency 04/07/2017   CAD in native artery 03/09/2017   Old MI (myocardial infarction) 03/09/2017   Carpal tunnel syndrome, 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   Ulnar neuropathy at elbow, 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   PVC's (premature ventricular contractions) 10/09/2015   Chronic dryness of both eyes 09/26/2015   Hypermetropia 09/26/2015   Presbyopia 09/26/2015   Regular astigmatism 09/26/2015   Hypermetropia of both eyes 09/26/2015   Senile nuclear sclerosis 09/26/2015   Stationary peripheral pterygium of right eye 09/26/2015   Unspecified esotropia 09/26/2015   Ventral hernia without obstruction or gangrene 07/30/2015   Dyslipidemia 05/09/2015   OSA  (obstructive sleep apnea) 05/09/2015   S/P CABG (coronary artery bypass graft) 05/09/2015   Unspecified atrial flutter (HCC) 05/09/2015    PCP: Jayne Mews, MD   REFERRING PROVIDER: Jayne Mews, MD   REFERRING DIAG:  R26.81 (ICD-10-CM) - Unsteadiness on feet R26.89 (ICD-10-CM) - Other abnormalities of gait and mobility  THERAPY DIAG:  Unsteadiness on feet  Ataxic gait  Muscle weakness (generalized)  RATIONALE FOR EVALUATION AND TREATMENT: Rehabilitation  ONSET DATE: Chronic  NEXT MD VISIT: 12/16/23   SUBJECTIVE:  SUBJECTIVE STATEMENT: Pt reports he has slacked off of HEP lately.  Eval: Pt reports how he feels depends on how much sleep he gets.  Arrives to PT with RW but states he also has a 4WW and hiking poles which he uses intermixed. His wife feels like he has been more unsteady of late.  He notes some R knee pain a few months back but now resolved.  Works with Systems analyst 2x/wk - STS, dumbbells, planks, etc.  Sees a chiropractor for his back 1x/month.  Pt accompanied by: self  PAIN: Are you having pain? No  PERTINENT HISTORY:  Chronic spinal cerebellar ataxia, neuropathy, osteoporosis, R sided pelvic fracture s/p fall 05/2020, MI/CAD s/p CABG x 4, Aflutter, GERD, B shoulder surgery/RCR, L carpal tunnel release, OSA  PRECAUTIONS: Fall  RED FLAGS: None  WEIGHT BEARING RESTRICTIONS: No  FALLS:  Has patient fallen in last 6 months? No  LIVING ENVIRONMENT: Lives with: lives with their family Lives in: House/apartment Stairs: Yes: Internal: 14 steps; on right going up and on left going up and External: 1 or 3 steps; on right going up and on left going up Has following equipment at home: Environmental consultant - 2 wheeled, Environmental consultant - 4 wheeled, shower chair, Grab bars, and  hiking poles  OCCUPATION: Retired  PLOF: Independent, Independent with community mobility with device, and Leisure: community outings with friends, walking loops in driveway, driving with son (adult CP); works with Systems analyst 2x/wk - STS, dumbbells, planks, etc   PATIENT GOALS: "To feel more confident walking with the walking sticks."   OBJECTIVE: (objective measures completed at initial evaluation unless otherwise dated)  DIAGNOSTIC FINDINGS:  06/22/23 - XR L toes (Left great toe skin breakdown) IMPRESSION:  Mild soft tissue swelling. No radiographic evidence of osteomyelitis.  Sonogram negative for DVT  COGNITION: Overall cognitive status: Within functional limits for tasks assessed   SENSATION: B LE neuropathy - predominantly in B feet  COORDINATION: Gross motor mild impairment B LE  EDEMA:  Intermittent distal LE edema  POSTURE:  No Significant postural limitations  MUSCLE LENGTH: Hamstrings: severe tight B ITB: mod tight L>R Piriformis: mod/severe tight B Hip flexors: mod tight B Quads: mild tight B Heelcord:   LOWER EXTREMITY ROM:    Grossly WFL other than restrictions related to above muscle tightness  LOWER EXTREMITY MMT:    MMT Right eval Left eval R 08/10/23 L 08/10/23 R 09/09/23 L 09/09/23  Hip flexion 4+ 4 5 5 5 5   Hip extension 4 4 4+ 4+ 4+ 4+  Hip abduction 4 4 4 4  4+ 4+  Hip adduction 4+ 4+ 4+ 5 4+ 4+  Hip internal rotation 4+ 4+ 5 5 5 5   Hip external rotation 4 4 4+ 4+ 5 4+  Knee flexion 5 5 5 5 5  4+  Knee extension 5 5 5 5 5 5   Ankle dorsiflexion 4+ 4 5 4+    Ankle plantarflexion        Ankle inversion        Ankle eversion        (Blank rows = not tested)  BED MOBILITY:  Independent  TRANSFERS: Assistive device utilized: Environmental consultant - 2 wheeled  Sit to stand: SBA Stand to sit: SBA Chair to chair:   Floor:    GAIT: Distance walked: clinic distances Assistive device utilized: Environmental consultant - 2 wheeled Level of assistance: Modified  independence and SBA Gait pattern: step to pattern, decreased hip/knee flexion- Right, decreased hip/knee flexion- Left, ataxic, and  trunk flexed Comments:   RAMP: Level of Assistance: NT Assistive device utilized:   Ramp Comments:   CURB:  Level of Assistance: NT Assistive device utilized:   Curb Comments:   STAIRS:  Level of Assistance: NT  Stair Negotiation Technique:   Number of Stairs:    Height of Stairs:   Comments:   FUNCTIONAL TESTS:  5 times sit to stand: 16.47 sec w/o UE assist Timed up and go (TUG): 20.63 sec with RW 10 meter walk test: 32.68 sec with RW; 1.00 ft/sec with RW  Berg Balance Scale: 26/56; Scores of <36 indicate high risk for falls (close to 100%) - 07/19/23 Dynamic Gait Index: 10/24; Scores of 19 or less are predictive of falls in older community living adults - 07/19/23 MCTSIB: Condition 1: Avg of 3 trials: 30 sec, Condition 2: Avg of 3 trials: 8.67 sec, Condition 3: Avg of 3 trials: 30 sec, Condition 4: Avg of 3 trials: 4 sec, and Total Score: 72.67/120  08/10/23: 5xSTS = 14.09 sec  08/30/23: TUG = 13.0 sec, >13.5 sec is predictive of falls = 12.16 sec Gait speed = 2.70 ft/sec with RW; >2.62 ft/sec indicates community ambulator Berg = 32/56, <36 = High risk for falls (close to 100%)  DGI = 13/24, scores of 19 or less are predictive of falls in older community living adults  PATIENT SURVEYS:  ABC scale 360 / 1600 = 22.5 %; <50% indicates low level of physical functioning and risk for recurrent falls 08/30/23 - 08/30/23 - 570 / 1600 = 35.6 %   TODAY'S TREATMENT:  09/09/23 THERAPEUTIC EXERCISE: To improve strength and endurance.  Demonstration, verbal and tactile cues throughout for technique.  Nustep L6x71min UE/LE Le mmt Fwd step and reach to foam roll standing up Lateral step and reach to foam roll standing up Cone taps fwd, lateral and posterior Claf raises 2x6 Ankle pump with black TB x 10 B  09/02/23 THERAPEUTIC EXERCISE: To improve  strength and endurance.  Demonstration, verbal and tactile cues throughout for technique.  Rec Bike - L3 x 6 min Gait for 650' with RW - ataxic gait  Standing with UE support on green pball: -hip abduction x 10  -hip extension x 10 -marching x 10 -4 way weight shifting green pball x 10 each way -modified bird dog x 5 with arm raise and toe tap posterior -Lateral toe taps x 10 B  08/30/23 THERAPEUTIC EXERCISE: To improve strength and endurance.  Demonstration, verbal and tactile cues throughout for technique.  Rec Bike - L3 x 6 min  PHYSICAL PERFORMANCE TEST or MEASUREMENT: TUG = 13.0 sec, >13.5 sec is predictive of falls = 12.16 sec Gait speed = 2.70 ft/sec with RW; >2.62 ft/sec indicates community ambulator Berg = 32/56, <36 = High risk for falls (close to 100%)  DGI = 13/24, scores of 19 or less are predictive of falls in older community living adults ABC scale = 570 / 1600 = 35.6 %, < 50% indicates low level of physical functioning  Berg Balance Test   Sit to Stand Able to stand without using hands and stabilize independently    Standing Unsupported Able to stand 2 minutes with supervision    Sitting with Back Unsupported but Feet Supported on Floor or Stool Able to sit safely and securely 2 minutes    Stand to Sit Sits safely with minimal use of hands    Transfers Able to transfer safely, definite need of hands    Standing Unsupported  with Eyes Closed Able to stand 3 seconds    Standing Unsupported with Feet Together Needs help to attain position but able to stand for 30 seconds with feet together    From Standing, Reach Forward with Outstretched Arm Can reach forward >12 cm safely (5")    From Standing Position, Pick up Object from Floor Able to pick up shoe, needs supervision    From Standing Position, Turn to Look Behind Over each Shoulder Needs supervision when turning    Turn 360 Degrees Needs close supervision or verbal cueing    Standing Unsupported, Alternately  Place Feet on Step/Stool Able to complete >2 steps/needs minimal assist    Standing Unsupported, One Foot in Front Needs help to step but can hold 15 seconds    Standing on One Leg Tries to lift leg/unable to hold 3 seconds but remains standing independently    Total Score 32    Berg comment: <36 = High risk for falls (close to 100%)      Dynamic Gait Index   Level Surface Mild Impairment    Change in Gait Speed Mild Impairment    Gait with Horizontal Head Turns Mild Impairment    Gait with Vertical Head Turns Mild Impairment    Gait and Pivot Turn Moderate Impairment    Step Over Obstacle Mild Impairment    Step Around Obstacles Mild Impairment    Steps Severe Impairment    Total Score 13    DGI comment: Scores of 19 or less are predictive of falls in older community living adults       08/23/23 NEUROMUSCULAR RE-EDUCATION: To improve proprioception, balance, and kinesthesia. Nustep L6x69min UE/LE- for reciprocal arm motion With counter support:  Toe taps to 9' stool finger touch support 2x10- more difficult supported on L LE  Stepping up and over on yoga block x 10 bil- unstable when stepping with RLE  Side steps RTB at ankles 3x10" down and back Standing with UE support on green pball: Fwd and lateral toe taps to floor 5x bil Seated pallof press GTB x 10  Seated shoulder extension GTB x 10  +  08/16/23 NEUROMUSCULAR RE-EDUCATION: To improve proprioception, balance, and kinesthesia. Nustep L6x32min UE/LE- for reciprocal arm motion Alt fwd stepping x 10 bil Alt retro stepping x 10 bil Stepping hip up and over x 10 bil Standing bird dog 2 x 10 bil Toe taps to two yoga blocks on top of each other 2x10 Stepping up and over on yoga block x 10 bil- unstable when stepping with RLE   PATIENT EDUCATION:  Education details: progress with PT, ongoing PT POC, and continue with current HEP  Person educated: Patient Education method: Explanation Education comprehension: verbalized  understanding  HOME EXERCISE PROGRAM: Access Code: [email or web address] URL: [email or web address] Date: 09/09/2023 Prepared by: Damita Eppard  Exercises - Seated Hamstring Stretch  - 1-2 x daily - 7 x weekly - 3 reps - 30 sec hold - Seated Table Piriformis Stretch  - 1-2 x daily - 7 x weekly - 3 reps - 30 sec hold - Seated Hip Flexor Stretch  - 1-2 x daily - 7 x weekly - 3 reps - 30 sec hold - Heel Raises with Counter Support  - 1 x daily - 7 x weekly - 3 sets - 10 reps - Standing Hip Abduction with Counter Support  - 1 x daily - 7 x weekly - 3 sets - 10 reps - Standing Hip Extension with Counter Support  - 1  x daily - 7 x weekly - 3 sets - 10 reps - Standing March with Counter Support  - 1 x daily - 7 x weekly - 3 sets - 10 reps - Seated Ankle Plantarflexion with Resistance  - 1 x daily - 7 x weekly - 2-3 sets - 10 reps  Patient Education - Check for Safety  Patient Education - Check for Safety  ASSESSMENT:  CLINICAL IMPRESSION: LTG #4 is met for LE strength. We worked on more balance training with less UE support with stepping exercises and reaching. Pt shows a good amount of calf weakness with heel raises. Added black TB PF to help strengthening this to improve his overall balance. [place] will benefit from continued skilled PT to address ongoing strength, coordination and balance deficits to improve mobility and activity tolerance with decreased risk for falls to help reach maximal level of functional independence.   OBJECTIVE IMPAIRMENTS: Abnormal gait, decreased activity tolerance, decreased balance, decreased coordination, decreased endurance, decreased knowledge of use of DME, decreased mobility, difficulty walking, decreased ROM, decreased strength, decreased safety awareness, impaired perceived functional ability, impaired flexibility, impaired sensation, improper body mechanics, and postural dysfunction.   ACTIVITY LIMITATIONS: carrying, lifting, bending, standing,  squatting, stairs, transfers, bathing, dressing, reach over head, locomotion level, and caring for others  PARTICIPATION LIMITATIONS: meal prep, cleaning, laundry, driving, shopping, community activity, and yard work  PERSONAL FACTORS: Age, Past/current experiences, Time since onset of injury/illness/exacerbation, and 3+ comorbidities: Chronic spinal cerebellar ataxia, neuropathy, osteoporosis, R sided pelvic fracture s/p fall 05/2020, MI/CAD s/p CABG x 4, Aflutter, GERD, B shoulder surgery/RCR, L carpal tunnel release, OSA are also affecting patient's functional outcome.   REHAB POTENTIAL: Good  CLINICAL DECISION MAKING: Unstable/unpredictable  EVALUATION COMPLEXITY: High   GOALS: Goals reviewed with patient? Yes  SHORT TERM GOALS: Target date: 08/09/2023  Patient will be independent with initial HEP to improve outcomes and carryover.  Baseline:  Goal status: MET - 08/05/23  2.  Patient will be educated on strategies to decrease risk of falls.  Baseline:  Goal status: MET - 08/03/23 - Educated on Fall Prevention  3.  Patient will demonstrate decreased 5xSTS time to </= 14.8 sec to decrease risk for falls with transitional mobility. Baseline: 16.47 sec Goal status: MET - 08/10/23 - 14.09 sec  LONG TERM GOALS: Target date: 09/06/2023, extended to 10/18/2023   Patient will be independent with advanced/ongoing HEP to facilitate ability to maintain/progress functional gains from skilled physical therapy services. Baseline:  Goal status: IN PROGRESS - 08/30/23 - met for current HEP  2.  Patient will be able to ambulate 600' with LRAD on variable surfaces with good safety to access community.  Baseline:  Goal status: IN PROGRESS   3.  Patient will be able to step up/down curb safely with LRAD for safety with community ambulation.  Baseline:  Goal status: IN PROGRESS   4.  Patient will demonstrate improved B LE strength to >/= 4+/5 for improved stability and ease of mobility . Baseline:  Refer to above LE MMT table Goal status: MET- 09/09/23  5.  Patient will improve TUG time to </= 15 seconds with walking sticks or LRAD for improved efficiency and safety with transfers. Baseline: 20.63 sec with RW Goal status: MET - 08/30/23 - 13.0 sec with RW  6.  Patient will demonstrate gait speed of >/= 1.8 ft/sec (0.55 m/s) with walking sticks or LRAD to be a safe limited community ambulator with decreased risk for recurrent falls.  Baseline: 1.00  ft/sec Goal status: PARTIALLY MET - 08/30/23 - 2.70 ft/sec with RW  7.  Patient will improve Berg score by at least 8 points to improve safety and stability with ADLs in standing and reduce risk for falls. (MCID= 8 points)  Baseline: 26/56 (07/19/23) Goal status: IN PROGRESS - 08/30/23 - 32/56  8.  Patient will demonstrate at least 3 point improvement on DGI to improve gait stability and reduce risk for falls. Baseline: 10/24 (07/19/23) Goal status: MET - 08/30/23 - 13/24  9. Patient will report >/= 40% on ABC scale to demonstrate improved balance confidence with functional mobility and gait. Baseline: 360 / 1600 = 22.5 % Goal status: IN PROGRESS - 08/30/23 - 570 / 1600 = 35.6 %   PLAN:  PT FREQUENCY: 2x/week  PT DURATION: 6-7 weeks  PLANNED INTERVENTIONS: [id number]- PT Re-evaluation, 97110-Therapeutic exercises, 97530- Therapeutic activity, 97112- Neuromuscular re-education, 97535- Self Care, [id number]- Manual therapy, [phone]- Gait training, Patient/Family education, Balance training, Stair training, Taping, Dry Needling, DME instructions, Cryotherapy, and Moist heat  PLAN FOR NEXT SESSION: Assess LTG's #2 & 3; increase ankle weights (based on pt's response) to LE strengthening and static balance and update HEP as indicated; stepping/weight shifting exercises; work towards gait training with hiking poles   Samuella Crocker, PTA 09/09/2023, 2:07 PM

## 2023-09-13 ENCOUNTER — Ambulatory Visit

## 2023-09-13 DIAGNOSIS — R260 Ataxic gait: Secondary | ICD-10-CM

## 2023-09-13 DIAGNOSIS — R2681 Unsteadiness on feet: Secondary | ICD-10-CM

## 2023-09-13 DIAGNOSIS — M6281 Muscle weakness (generalized): Secondary | ICD-10-CM

## 2023-09-13 NOTE — Therapy (Signed)
 OUTPATIENT PHYSICAL THERAPY TREATMENT     Patient Name: Andre [name] MRN: [id number] DOB:[birth year], 83 y.o., male Today's Date: 09/13/2023   END OF SESSION:  PT End of Session - 09/13/23 1708     Visit Number 13    Date for PT Re-Evaluation 10/18/23    Authorization Type [employer]    Progress Note Due on Visit 20    PT Start Time 1618    PT Stop Time 1658    PT Time Calculation (min) 40 min    Activity Tolerance Patient tolerated treatment well    Behavior During Therapy WFL for tasks assessed/performed                         Past Medical History:  Diagnosis Date   Carpal tunnel syndrome    Cerebellar ataxia (HCC)    spinal cerebellar ataxia   Dyslipidemia    H/O rotator cuff surgery    Obstructive sleep apnea    Past Surgical History:  Procedure Laterality Date   BYPASS GRAFT AORTA TO AORTA     quadruple bypass   CARPAL TUNNEL RELEASE Left    EYE SURGERY Bilateral    SHOULDER SURGERY     bilateral   TONSILLECTOMY     VASECTOMY     Patient Active Problem List   Diagnosis Date Noted   Osteoporosis, senile 08/27/2022   Neuropathy 12/12/2018   Cerebellar ataxia (HCC) 12/12/2018   Vitreous membranes and strands 10/20/2018   Bilateral cold feet 04/07/2017   Osteopenia of multiple sites 04/07/2017   Vitamin D deficiency 04/07/2017   CAD in native artery 03/09/2017   Old MI (myocardial infarction) 03/09/2017   Carpal tunnel syndrome, 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   Ulnar neuropathy at elbow, 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   PVC's (premature ventricular contractions) 10/09/2015   Chronic dryness of both eyes 09/26/2015   Hypermetropia 09/26/2015   Presbyopia 09/26/2015   Regular astigmatism 09/26/2015   Hypermetropia of both eyes 09/26/2015   Senile nuclear sclerosis 09/26/2015   Stationary peripheral pterygium of right eye 09/26/2015   Unspecified esotropia 09/26/2015   Ventral hernia without obstruction or gangrene 07/30/2015   Dyslipidemia 05/09/2015    OSA (obstructive sleep apnea) 05/09/2015   S/P CABG (coronary artery bypass graft) 05/09/2015   Unspecified atrial flutter (HCC) 05/09/2015    PCP: Jayne Mews, MD   REFERRING PROVIDER: Jayne Mews, MD   REFERRING DIAG:  R26.81 (ICD-10-CM) - Unsteadiness on feet R26.89 (ICD-10-CM) - Other abnormalities of gait and mobility  THERAPY DIAG:  Unsteadiness on feet  Ataxic gait  Muscle weakness (generalized)  RATIONALE FOR EVALUATION AND TREATMENT: Rehabilitation  ONSET DATE: Chronic  NEXT MD VISIT: 12/16/23   SUBJECTIVE:  SUBJECTIVE STATEMENT: Pt doing well, good with new exercises.   Eval: Pt reports how he feels depends on how much sleep he gets.  Arrives to PT with RW but states he also has a 4WW and hiking poles which he uses intermixed. His wife feels like he has been more unsteady of late.  He notes some R knee pain a few months back but now resolved.  Works with Systems analyst 2x/wk - STS, dumbbells, planks, etc.  Sees a chiropractor for his back 1x/month.  Pt accompanied by: self  PAIN: Are you having pain? No  PERTINENT HISTORY:  Chronic spinal cerebellar ataxia, neuropathy, osteoporosis, R sided pelvic fracture s/p fall 05/2020, MI/CAD s/p CABG x 4, Aflutter, GERD, B shoulder surgery/RCR, L carpal tunnel release, OSA  PRECAUTIONS: Fall  RED FLAGS: None  WEIGHT BEARING RESTRICTIONS: No  FALLS:  Has patient fallen in last 6 months? No  LIVING ENVIRONMENT: Lives with: lives with their family Lives in: House/apartment Stairs: Yes: Internal: 14 steps; on right going up and on left going up and External: 1 or 3 steps; on right going up and on left going up Has following equipment at home: Environmental consultant - 2 wheeled, Environmental consultant - 4 wheeled, shower chair, Grab bars, and  hiking poles  OCCUPATION: Retired  PLOF: Independent, Independent with community mobility with device, and Leisure: community outings with friends, walking loops in driveway, driving with son (adult CP); works with Systems analyst 2x/wk - STS, dumbbells, planks, etc   PATIENT GOALS: "To feel more confident walking with the walking sticks."   OBJECTIVE: (objective measures completed at initial evaluation unless otherwise dated)  DIAGNOSTIC FINDINGS:  06/22/23 - XR L toes (Left great toe skin breakdown) IMPRESSION:  Mild soft tissue swelling. No radiographic evidence of osteomyelitis.  Sonogram negative for DVT  COGNITION: Overall cognitive status: Within functional limits for tasks assessed   SENSATION: B LE neuropathy - predominantly in B feet  COORDINATION: Gross motor mild impairment B LE  EDEMA:  Intermittent distal LE edema  POSTURE:  No Significant postural limitations  MUSCLE LENGTH: Hamstrings: severe tight B ITB: mod tight L>R Piriformis: mod/severe tight B Hip flexors: mod tight B Quads: mild tight B Heelcord:   LOWER EXTREMITY ROM:    Grossly WFL other than restrictions related to above muscle tightness  LOWER EXTREMITY MMT:    MMT Right eval Left eval R 08/10/23 L 08/10/23 R 09/09/23 L 09/09/23  Hip flexion 4+ 4 5 5 5 5   Hip extension 4 4 4+ 4+ 4+ 4+  Hip abduction 4 4 4 4  4+ 4+  Hip adduction 4+ 4+ 4+ 5 4+ 4+  Hip internal rotation 4+ 4+ 5 5 5 5   Hip external rotation 4 4 4+ 4+ 5 4+  Knee flexion 5 5 5 5 5  4+  Knee extension 5 5 5 5 5 5   Ankle dorsiflexion 4+ 4 5 4+    Ankle plantarflexion        Ankle inversion        Ankle eversion        (Blank rows = not tested)  BED MOBILITY:  Independent  TRANSFERS: Assistive device utilized: Environmental consultant - 2 wheeled  Sit to stand: SBA Stand to sit: SBA Chair to chair:   Floor:    GAIT: Distance walked: clinic distances Assistive device utilized: Environmental consultant - 2 wheeled Level of assistance: Modified  independence and SBA Gait pattern: step to pattern, decreased hip/knee flexion- Right, decreased hip/knee flexion- Left, ataxic, and trunk  flexed Comments:   RAMP: Level of Assistance: NT Assistive device utilized:   Ramp Comments:   CURB:  Level of Assistance: NT Assistive device utilized:   Curb Comments:   STAIRS:  Level of Assistance: NT  Stair Negotiation Technique:   Number of Stairs:    Height of Stairs:   Comments:   FUNCTIONAL TESTS:  5 times sit to stand: 16.47 sec w/o UE assist Timed up and go (TUG): 20.63 sec with RW 10 meter walk test: 32.68 sec with RW; 1.00 ft/sec with RW  Berg Balance Scale: 26/56; Scores of <36 indicate high risk for falls (close to 100%) - 07/19/23 Dynamic Gait Index: 10/24; Scores of 19 or less are predictive of falls in older community living adults - 07/19/23 MCTSIB: Condition 1: Avg of 3 trials: 30 sec, Condition 2: Avg of 3 trials: 8.67 sec, Condition 3: Avg of 3 trials: 30 sec, Condition 4: Avg of 3 trials: 4 sec, and Total Score: 72.67/120  08/10/23: 5xSTS = 14.09 sec  08/30/23: TUG = 13.0 sec, >13.5 sec is predictive of falls = 12.16 sec Gait speed = 2.70 ft/sec with RW; >2.62 ft/sec indicates community ambulator Berg = 32/56, <36 = High risk for falls (close to 100%)  DGI = 13/24, scores of 19 or less are predictive of falls in older community living adults  PATIENT SURVEYS:  ABC scale 360 / 1600 = 22.5 %; <50% indicates low level of physical functioning and risk for recurrent falls 08/30/23 - 08/30/23 - 570 / 1600 = 35.6 %   TODAY'S TREATMENT:  09/13/23 THERAPEUTIC EXERCISE: To improve strength and endurance.  Demonstration, verbal and tactile cues throughout for technique. Bike L3x79min Seated PF black TB 2x10 B Standing calf raise x 10- staggered with L behind  NEUROMUSCULAR RE-EDUCATION: To improve proprioception, balance, and kinesthesia. TRX squats 2x10 TRX lunges each side x 10  Standing hip flexion (1HA), abduction  (2HA) GTB 2x10 B Seated pallof press GTB x 10 B Seated trunk rotation GTB x 10 B  09/09/23 THERAPEUTIC EXERCISE: To improve strength and endurance.  Demonstration, verbal and tactile cues throughout for technique.  Nustep L6x58min UE/LE Le mmt Fwd step and reach to foam roll standing up Lateral step and reach to foam roll standing up Cone taps fwd, lateral and posterior Claf raises 2x6 Ankle pump with black TB x 10 B  09/02/23 THERAPEUTIC EXERCISE: To improve strength and endurance.  Demonstration, verbal and tactile cues throughout for technique.  Rec Bike - L3 x 6 min Gait for 650' with RW - ataxic gait  Standing with UE support on green pball: -hip abduction x 10  -hip extension x 10 -marching x 10 -4 way weight shifting green pball x 10 each way -modified bird dog x 5 with arm raise and toe tap posterior -Lateral toe taps x 10 B  08/30/23 THERAPEUTIC EXERCISE: To improve strength and endurance.  Demonstration, verbal and tactile cues throughout for technique.  Rec Bike - L3 x 6 min  PHYSICAL PERFORMANCE TEST or MEASUREMENT: TUG = 13.0 sec, >13.5 sec is predictive of falls = 12.16 sec Gait speed = 2.70 ft/sec with RW; >2.62 ft/sec indicates community ambulator Berg = 32/56, <36 = High risk for falls (close to 100%)  DGI = 13/24, scores of 19 or less are predictive of falls in older community living adults ABC scale = 570 / 1600 = 35.6 %, < 50% indicates low level of physical functioning  [employer]  Sit to Stand Able to stand without using hands and stabilize independently    Standing Unsupported Able to stand 2 minutes with supervision    Sitting with Back Unsupported but Feet Supported on Floor or Stool Able to sit safely and securely 2 minutes    Stand to Sit Sits safely with minimal use of hands    Transfers Able to transfer safely, definite need of hands    Standing Unsupported with Eyes Closed Able to stand 3 seconds    Standing Unsupported with Feet  Together Needs help to attain position but able to stand for 30 seconds with feet together    From Standing, Reach Forward with Outstretched Arm Can reach forward >12 cm safely (5")    From Standing Position, Pick up Object from Floor Able to pick up shoe, needs supervision    From Standing Position, Turn to Look Behind Over each Shoulder Needs supervision when turning    Turn 360 Degrees Needs close supervision or verbal cueing    Standing Unsupported, Alternately Place Feet on Step/Stool Able to complete >2 steps/needs minimal assist    Standing Unsupported, One Foot in Front Needs help to step but can hold 15 seconds    Standing on One Leg Tries to lift leg/unable to hold 3 seconds but remains standing independently    Total Score 32    Berg comment: <36 = High risk for falls (close to 100%)      Dynamic Gait Index   Level Surface Mild Impairment    Change in Gait Speed Mild Impairment    Gait with Horizontal Head Turns Mild Impairment    Gait with Vertical Head Turns Mild Impairment    Gait and Pivot Turn Moderate Impairment    Step Over Obstacle Mild Impairment    Step Around Obstacles Mild Impairment    Steps Severe Impairment    Total Score 13    DGI comment: Scores of 19 or less are predictive of falls in older community living adults       08/23/23 NEUROMUSCULAR RE-EDUCATION: To improve proprioception, balance, and kinesthesia. Nustep L6x29min UE/LE- for reciprocal arm motion With counter support:  Toe taps to 9' stool finger touch support 2x10- more difficult supported on L LE  Stepping up and over on yoga block x 10 bil- unstable when stepping with RLE  Side steps RTB at ankles 3x10" down and back Standing with UE support on green pball: Fwd and lateral toe taps to floor 5x bil Seated pallof press GTB x 10  Seated shoulder extension GTB x 10  +  08/16/23 NEUROMUSCULAR RE-EDUCATION: To improve proprioception, balance, and kinesthesia. Nustep L6x67min UE/LE- for  reciprocal arm motion Alt fwd stepping x 10 bil Alt retro stepping x 10 bil Stepping hip up and over x 10 bil Standing bird dog 2 x 10 bil Toe taps to two yoga blocks on top of each other 2x10 Stepping up and over on yoga block x 10 bil- unstable when stepping with RLE   PATIENT EDUCATION:  Education details: progress with PT, ongoing PT POC, and continue with current HEP  Person educated: Patient Education method: Explanation Education comprehension: verbalized understanding  HOME EXERCISE PROGRAM: Access Code: [email or web address] URL: [email or web address] Date: 09/09/2023 Prepared by: Loman Logan  Exercises - Seated Hamstring Stretch  - 1-2 x daily - 7 x weekly - 3 reps - 30 sec hold - Seated Table Piriformis Stretch  - 1-2 x daily - 7 x weekly - 3 reps - 30  sec hold - Seated Hip Flexor Stretch  - 1-2 x daily - 7 x weekly - 3 reps - 30 sec hold - Heel Raises with Counter Support  - 1 x daily - 7 x weekly - 3 sets - 10 reps - Standing Hip Abduction with Counter Support  - 1 x daily - 7 x weekly - 3 sets - 10 reps - Standing Hip Extension with Counter Support  - 1 x daily - 7 x weekly - 3 sets - 10 reps - Standing March with Counter Support  - 1 x daily - 7 x weekly - 3 sets - 10 reps - Seated Ankle Plantarflexion with Resistance  - 1 x daily - 7 x weekly - 2-3 sets - 10 reps  Patient Education - Check for Safety  Patient Education - Check for Safety  ASSESSMENT:  CLINICAL IMPRESSION: We worked on more balance training with variable UE support and core stability. Pt shows a good amount of calf weakness with heel raises especially on L. He continues to have difficulty maintaining balance with 1HA throughout the session. Delson will benefit from continued skilled PT to address ongoing strength, coordination and balance deficits to improve mobility and activity tolerance with decreased risk for falls to help reach maximal level of functional independence.   OBJECTIVE  IMPAIRMENTS: Abnormal gait, decreased activity tolerance, decreased balance, decreased coordination, decreased endurance, decreased knowledge of use of DME, decreased mobility, difficulty walking, decreased ROM, decreased strength, decreased safety awareness, impaired perceived functional ability, impaired flexibility, impaired sensation, improper body mechanics, and postural dysfunction.   ACTIVITY LIMITATIONS: carrying, lifting, bending, standing, squatting, stairs, transfers, bathing, dressing, reach over head, locomotion level, and caring for others  PARTICIPATION LIMITATIONS: meal prep, cleaning, laundry, driving, shopping, community activity, and yard work  PERSONAL FACTORS: Age, Past/current experiences, Time since onset of injury/illness/exacerbation, and 3+ comorbidities: Chronic spinal cerebellar ataxia, neuropathy, osteoporosis, R sided pelvic fracture s/p fall 05/2020, MI/CAD s/p CABG x 4, Aflutter, GERD, B shoulder surgery/RCR, L carpal tunnel release, OSA are also affecting patient's functional outcome.   REHAB POTENTIAL: Good  CLINICAL DECISION MAKING: Unstable/unpredictable  EVALUATION COMPLEXITY: High   GOALS: Goals reviewed with patient? Yes  SHORT TERM GOALS: Target date: 08/09/2023  Patient will be independent with initial HEP to improve outcomes and carryover.  Baseline:  Goal status: MET - 08/05/23  2.  Patient will be educated on strategies to decrease risk of falls.  Baseline:  Goal status: MET - 08/03/23 - Educated on Fall Prevention  3.  Patient will demonstrate decreased 5xSTS time to </= 14.8 sec to decrease risk for falls with transitional mobility. Baseline: 16.47 sec Goal status: MET - 08/10/23 - 14.09 sec  LONG TERM GOALS: Target date: 09/06/2023, extended to 10/18/2023   Patient will be independent with advanced/ongoing HEP to facilitate ability to maintain/progress functional gains from skilled physical therapy services. Baseline:  Goal status: IN  PROGRESS - 08/30/23 - met for current HEP  2.  Patient will be able to ambulate 600' with LRAD on variable surfaces with good safety to access community.  Baseline:  Goal status: IN PROGRESS   3.  Patient will be able to step up/down curb safely with LRAD for safety with community ambulation.  Baseline:  Goal status: IN PROGRESS   4.  Patient will demonstrate improved B LE strength to >/= 4+/5 for improved stability and ease of mobility . Baseline: Refer to above LE MMT table Goal status: MET- 09/09/23  5.  Patient will  improve TUG time to </= 15 seconds with walking sticks or LRAD for improved efficiency and safety with transfers. Baseline: 20.63 sec with RW Goal status: MET - 08/30/23 - 13.0 sec with RW  6.  Patient will demonstrate gait speed of >/= 1.8 ft/sec (0.55 m/s) with walking sticks or LRAD to be a safe limited community ambulator with decreased risk for recurrent falls.  Baseline: 1.00 ft/sec Goal status: PARTIALLY MET - 08/30/23 - 2.70 ft/sec with RW  7.  Patient will improve Berg score by at least 8 points to improve safety and stability with ADLs in standing and reduce risk for falls. (MCID= 8 points)  Baseline: 26/56 (07/19/23) Goal status: IN PROGRESS - 08/30/23 - 32/56  8.  Patient will demonstrate at least 3 point improvement on DGI to improve gait stability and reduce risk for falls. Baseline: 10/24 (07/19/23) Goal status: MET - 08/30/23 - 13/24  9. Patient will report >/= 40% on ABC scale to demonstrate improved balance confidence with functional mobility and gait. Baseline: 360 / 1600 = 22.5 % Goal status: IN PROGRESS - 08/30/23 - 570 / 1600 = 35.6 %   PLAN:  PT FREQUENCY: 2x/week  PT DURATION: 6-7 weeks  PLANNED INTERVENTIONS: 97164- PT Re-evaluation, 97110-Therapeutic exercises, 97530- Therapeutic activity, 97112- Neuromuscular re-education, 97535- Self Care, 16109- Manual therapy, [phone]- Gait training, Patient/Family education, Balance training, Stair training,  Taping, Dry Needling, DME instructions, Cryotherapy, and Moist heat  PLAN FOR NEXT SESSION: Assess LTG's #2 & 3; increase ankle weights (based on pt's response) to LE strengthening and static balance and update HEP as indicated; stepping/weight shifting exercises; work towards gait training with hiking poles   Samuella Crocker, PTA 09/13/2023, 5:09 PM

## 2023-09-16 ENCOUNTER — Encounter: Payer: Self-pay | Admitting: Physical Therapy

## 2023-09-16 ENCOUNTER — Ambulatory Visit: Admitting: Physical Therapy

## 2023-09-16 DIAGNOSIS — R260 Ataxic gait: Secondary | ICD-10-CM

## 2023-09-16 DIAGNOSIS — M6281 Muscle weakness (generalized): Secondary | ICD-10-CM

## 2023-09-16 DIAGNOSIS — R2681 Unsteadiness on feet: Secondary | ICD-10-CM | POA: Diagnosis not present

## 2023-09-16 NOTE — Therapy (Signed)
 OUTPATIENT PHYSICAL THERAPY TREATMENT     Patient Name: Andre [name] MRN: [id number] DOB:04-16-1941, 83 y.o., male Today's Date: 09/16/2023   END OF SESSION:  PT End of Session - 09/16/23 1532     Visit Number 14    Date for PT Re-Evaluation 10/18/23    Authorization Type [employer]    Progress Note Due on Visit 20    PT Start Time 1532    PT Stop Time 1616    PT Time Calculation (min) 44 min    Activity Tolerance Patient tolerated treatment well    Behavior During Therapy WFL for tasks assessed/performed                          Past Medical History:  Diagnosis Date   Carpal tunnel syndrome    Cerebellar ataxia (HCC)    spinal cerebellar ataxia   Dyslipidemia    H/O rotator cuff surgery    Obstructive sleep apnea    Past Surgical History:  Procedure Laterality Date   BYPASS GRAFT AORTA TO AORTA     quadruple bypass   CARPAL TUNNEL RELEASE Left    EYE SURGERY Bilateral    SHOULDER SURGERY     bilateral   TONSILLECTOMY     VASECTOMY     Patient Active Problem List   Diagnosis Date Noted   Osteoporosis, senile 08/27/2022   Neuropathy 12/12/2018   Cerebellar ataxia (HCC) 12/12/2018   Vitreous membranes and strands 10/20/2018   Bilateral cold feet 04/07/2017   Osteopenia of multiple sites 04/07/2017   Vitamin D deficiency 04/07/2017   CAD in native artery 03/09/2017   Old MI (myocardial infarction) 03/09/2017   Carpal tunnel syndrome, 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   Ulnar neuropathy at elbow, 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   PVC's (premature ventricular contractions) 10/09/2015   Chronic dryness of both eyes 09/26/2015   Hypermetropia 09/26/2015   Presbyopia 09/26/2015   Regular astigmatism 09/26/2015   Hypermetropia of both eyes 09/26/2015   Senile nuclear sclerosis 09/26/2015   Stationary peripheral pterygium of right eye 09/26/2015   Unspecified esotropia 09/26/2015   Ventral hernia without obstruction or gangrene 07/30/2015   Dyslipidemia 05/09/2015    OSA (obstructive sleep apnea) 05/09/2015   S/P CABG (coronary artery bypass graft) 05/09/2015   Unspecified atrial flutter (HCC) 05/09/2015    PCP: Jayne Mews, MD   REFERRING PROVIDER: Jayne Mews, MD   REFERRING DIAG:  R26.81 (ICD-10-CM) - Unsteadiness on feet R26.89 (ICD-10-CM) - Other abnormalities of gait and mobility  THERAPY DIAG:  Unsteadiness on feet  Ataxic gait  Muscle weakness (generalized)  RATIONALE FOR EVALUATION AND TREATMENT: Rehabilitation  ONSET DATE: Chronic  NEXT MD VISIT: 12/16/23   SUBJECTIVE:  SUBJECTIVE STATEMENT: No new concerns.  Still interested in trying to get back to walking with hiking poles.   Eval: Pt reports how he feels depends on how much sleep he gets.  Arrives to PT with RW but states he also has a 4WW and hiking poles which he uses intermixed. His wife feels like he has been more unsteady of late.  He notes some R knee pain a few months back but now resolved.  Works with Systems analyst 2x/wk - STS, dumbbells, planks, etc.  Sees a chiropractor for his back 1x/month.  Pt accompanied by: self  PAIN: Are you having pain? No  PERTINENT HISTORY:  Chronic spinal cerebellar ataxia, neuropathy, osteoporosis, R sided pelvic fracture s/p fall 05/2020, MI/CAD s/p CABG x 4, Aflutter, GERD, B shoulder surgery/RCR, L carpal tunnel release, OSA  PRECAUTIONS: Fall  RED FLAGS: None  WEIGHT BEARING RESTRICTIONS: No  FALLS:  Has patient fallen in last 6 months? No  LIVING ENVIRONMENT: Lives with: lives with their family Lives in: House/apartment Stairs: Yes: Internal: 14 steps; on right going up and on left going up and External: 1 or 3 steps; on right going up and on left going up Has following equipment at home: Environmental consultant - 2 wheeled,  Environmental consultant - 4 wheeled, shower chair, Grab bars, and hiking poles  OCCUPATION: Retired  PLOF: Independent, Independent with community mobility with device, and Leisure: community outings with friends, walking loops in driveway, driving with son (adult CP); works with Systems analyst 2x/wk - STS, dumbbells, planks, etc   PATIENT GOALS: "To feel more confident walking with the walking sticks."   OBJECTIVE: (objective measures completed at initial evaluation unless otherwise dated)  DIAGNOSTIC FINDINGS:  06/22/23 - XR L toes (Left great toe skin breakdown) IMPRESSION:  Mild soft tissue swelling. No radiographic evidence of osteomyelitis.  Sonogram negative for DVT  COGNITION: Overall cognitive status: Within functional limits for tasks assessed   SENSATION: B LE neuropathy - predominantly in B feet  COORDINATION: Gross motor mild impairment B LE  EDEMA:  Intermittent distal LE edema  POSTURE:  No Significant postural limitations  MUSCLE LENGTH: Hamstrings: severe tight B ITB: mod tight L>R Piriformis: mod/severe tight B Hip flexors: mod tight B Quads: mild tight B Heelcord:   LOWER EXTREMITY ROM:    Grossly WFL other than restrictions related to above muscle tightness  LOWER EXTREMITY MMT:    MMT Right eval Left eval R 08/10/23 L 08/10/23 R 09/09/23 L 09/09/23  Hip flexion 4+ 4 5 5 5 5   Hip extension 4 4 4+ 4+ 4+ 4+  Hip abduction 4 4 4 4  4+ 4+  Hip adduction 4+ 4+ 4+ 5 4+ 4+  Hip internal rotation 4+ 4+ 5 5 5 5   Hip external rotation 4 4 4+ 4+ 5 4+  Knee flexion 5 5 5 5 5  4+  Knee extension 5 5 5 5 5 5   Ankle dorsiflexion 4+ 4 5 4+    Ankle plantarflexion        Ankle inversion        Ankle eversion        (Blank rows = not tested)  BED MOBILITY:  Independent  TRANSFERS: Assistive device utilized: Environmental consultant - 2 wheeled  Sit to stand: SBA Stand to sit: SBA Chair to chair:   Floor:    GAIT: Distance walked: clinic distances Assistive device utilized: Environmental consultant - 2  wheeled Level of assistance: Modified independence and SBA Gait pattern: step to pattern, decreased hip/knee  flexion- Right, decreased hip/knee flexion- Left, ataxic, and trunk flexed Comments:   RAMP: Level of Assistance: NT Assistive device utilized:   Ramp Comments:   CURB: (09/16/23) Level of Assistance: SBA Assistive device utilized: Environmental consultant - 2 wheeled Curb Comments:   STAIRS:  Level of Assistance: NT  Stair Negotiation Technique:   Number of Stairs:    Height of Stairs:   Comments:   FUNCTIONAL TESTS:  5 times sit to stand: 16.47 sec w/o UE assist Timed up and go (TUG): 20.63 sec with RW 10 meter walk test: 32.68 sec with RW; 1.00 ft/sec with RW  Berg Balance Scale: 26/56; Scores of <36 indicate high risk for falls (close to 100%) - 07/19/23 Dynamic Gait Index: 10/24; Scores of 19 or less are predictive of falls in older community living adults - 07/19/23 MCTSIB: Condition 1: Avg of 3 trials: 30 sec, Condition 2: Avg of 3 trials: 8.67 sec, Condition 3: Avg of 3 trials: 30 sec, Condition 4: Avg of 3 trials: 4 sec, and Total Score: 72.67/120  08/10/23: 5xSTS = 14.09 sec  08/30/23: TUG = 13.0 sec, >13.5 sec is predictive of falls = 12.16 sec Gait speed = 2.70 ft/sec with RW; >2.62 ft/sec indicates community ambulator Berg = 32/56, <36 = High risk for falls (close to 100%)  DGI = 13/24, scores of 19 or less are predictive of falls in older community living adults  PATIENT SURVEYS:  ABC scale 360 / 1600 = 22.5 %; <50% indicates low level of physical functioning and risk for recurrent falls 08/30/23 - 08/30/23 - 570 / 1600 = 35.6 %   TODAY'S TREATMENT:   09/16/23 THERAPEUTIC EXERCISE: To improve strength and endurance.  Demonstration, verbal and tactile cues throughout for technique.  NuStep - L6 x 6 min  GAIT TRAINING: To normalize gait pattern, improve safety with RW, and prepare to wean to LRAD.  350' with B hiking poles on level surfaces indoors with CGA of PT via  gait belt - cues for placement of hiking pole more to front to improve upright alignment for better stability and avoid pole sliding out to rear 600+' with RW over variable surfaces indoors and outdoors with SBA to occasional CGA via gait belt - pt with tendency to reach more fwd with RW when lifting it over thresholds (elevator or doorway) and entrance rugs 200' with B hiking poles on slightly unlevel surfaces outdoors with CGA of PT via gait belt - continued cues for placement of hiking pole more to front to improve upright alignment for better stability and avoid pole sliding out to rear - pt tending to increase speed with fwd LOB requiring min A x 1 to prevent LOB  NEUROMUSCULAR RE-EDUCATION: To improve balance, coordination, kinesthesia, posture, proprioception, reduce fall risk, and amplitude of movement.  All activities performed with CGA/SBA of PT vis gait belt B Standing GTB pallof press x 10 with wide BOS, x 10 with standard BOS B Standing GTB trunk rotation x 10 with wide BOS Standing GTB B scap retraction + shoulder row x 15, BOS slightly wider than hip width Standing GTB B scap retraction + shoulder extension x 15, BOS slightly wider than hip width Standing GTB B shoulder flexion x 15, BOS slightly wider than hip width   09/13/23 THERAPEUTIC EXERCISE: To improve strength and endurance.  Demonstration, verbal and tactile cues throughout for technique. Bike L3x37min Seated PF black TB 2x10 B Standing calf raise x 10- staggered with L behind   NEUROMUSCULAR  RE-EDUCATION: To improve proprioception, balance, and kinesthesia. TRX squats 2x10 TRX lunges each side x 10  Standing hip flexion (1HA), abduction (2HA) GTB 2x10 B Seated pallof press GTB x 10 B Seated trunk rotation GTB x 10 B   09/09/23 THERAPEUTIC EXERCISE: To improve strength and endurance.  Demonstration, verbal and tactile cues throughout for technique.  Nustep L6x22min UE/LE Le mmt Fwd step and reach to foam roll  standing up Lateral step and reach to foam roll standing up Cone taps fwd, lateral and posterior Claf raises 2x6 Ankle pump with black TB x 10 B   PATIENT EDUCATION:  Education details: progress with PT, ongoing PT POC, and continue with current HEP  Person educated: Patient Education method: Explanation Education comprehension: verbalized understanding  HOME EXERCISE PROGRAM: Access Code: [email or web address] URL: [email or web address] Date: 09/09/2023 Prepared by: Braylin Clark  Exercises - Seated Hamstring Stretch  - 1-2 x daily - 7 x weekly - 3 reps - 30 sec hold - Seated Table Piriformis Stretch  - 1-2 x daily - 7 x weekly - 3 reps - 30 sec hold - Seated Hip Flexor Stretch  - 1-2 x daily - 7 x weekly - 3 reps - 30 sec hold - Heel Raises with Counter Support  - 1 x daily - 7 x weekly - 3 sets - 10 reps - Standing Hip Abduction with Counter Support  - 1 x daily - 7 x weekly - 3 sets - 10 reps - Standing Hip Extension with Counter Support  - 1 x daily - 7 x weekly - 3 sets - 10 reps - Standing March with Counter Support  - 1 x daily - 7 x weekly - 3 sets - 10 reps - Seated Ankle Plantarflexion with Resistance  - 1 x daily - 7 x weekly - 2-3 sets - 10 reps  Patient Education - Check for Safety  Patient Education - Check for Safety  ASSESSMENT:  CLINICAL IMPRESSION: Andre [name] continues to express interest in attempting to resume ability to use hiking poles for some of his walking, therefore assessed gait with hiking poles both indoors and outdoors and worked on proper posture and placement of poles to provide best stability.  Also assessed gait over variable surfaces indoors and outdoors including curb negotiation with RW - pt with tendency to reach more fwd with RW when lifting it over thresholds (elevator or doorway) and entrance rugs or walking up inclines but no LOB.  With decreasing UE support, he relies on wider BOS and tends to experience more anterior/posterior LOB,  therefore remainder of session targeting dynamic balance challenges via multidirectional resisted UE movements in unsupported stance with PT providing CGA/SBA via gait belt.  Andre [name] will benefit from continued skilled PT to address ongoing strength, coordination and balance deficits to improve mobility and activity tolerance with decreased risk for falls to help reach maximal level of functional independence.   OBJECTIVE IMPAIRMENTS: Abnormal gait, decreased activity tolerance, decreased balance, decreased coordination, decreased endurance, decreased knowledge of use of DME, decreased mobility, difficulty walking, decreased ROM, decreased strength, decreased safety awareness, impaired perceived functional ability, impaired flexibility, impaired sensation, improper body mechanics, and postural dysfunction.   ACTIVITY LIMITATIONS: carrying, lifting, bending, standing, squatting, stairs, transfers, bathing, dressing, reach over head, locomotion level, and caring for others  PARTICIPATION LIMITATIONS: meal prep, cleaning, laundry, driving, shopping, community activity, and yard work  PERSONAL FACTORS: Age, Past/current experiences, Time since onset of injury/illness/exacerbation, and 3+ comorbidities: Chronic spinal cerebellar ataxia, neuropathy, osteoporosis, R sided  pelvic fracture s/p fall 05/2020, MI/CAD s/p CABG x 4, Aflutter, GERD, B shoulder surgery/RCR, L carpal tunnel release, OSA are also affecting patient's functional outcome.   REHAB POTENTIAL: Good  CLINICAL DECISION MAKING: Unstable/unpredictable  EVALUATION COMPLEXITY: High   GOALS: Goals reviewed with patient? Yes  SHORT TERM GOALS: Target date: 08/09/2023  Patient will be independent with initial HEP to improve outcomes and carryover.  Baseline:  Goal status: MET - 08/05/23  2.  Patient will be educated on strategies to decrease risk of falls.  Baseline:  Goal status: MET - 08/03/23 - Educated on Fall Prevention  3.  Patient  will demonstrate decreased 5xSTS time to </= 14.8 sec to decrease risk for falls with transitional mobility. Baseline: 16.47 sec Goal status: MET - 08/10/23 - 14.09 sec  LONG TERM GOALS: Target date: 09/06/2023, extended to 10/18/2023   Patient will be independent with advanced/ongoing HEP to facilitate ability to maintain/progress functional gains from skilled physical therapy services. Baseline:  Goal status: IN PROGRESS - 08/30/23 - met for current HEP  2.  Patient will be able to ambulate 600' with LRAD on variable surfaces with good safety to access community.  Baseline:  Goal status: IN PROGRESS - 09/16/23 - Able to ambulate 600+' with RW over variable surfaces indoors and outdoors with CGA/SBA of PT- pt with tendency to reach more fwd with RW when lifting it over thresholds (elevator or doorway) and entrance rugs or walking up inclines but no LOB  3.  Patient will be able to step up/down curb safely with LRAD for safety with community ambulation.  Baseline:  Goal status: IN PROGRESS - 09/16/23 - Able to ascend/descend curb with RW with SBA of PT   4.  Patient will demonstrate improved B LE strength to >/= 4+/5 for improved stability and ease of mobility . Baseline: Refer to above LE MMT table Goal status: MET - 09/09/23  5.  Patient will improve TUG time to </= 15 seconds with walking sticks or LRAD for improved efficiency and safety with transfers. Baseline: 20.63 sec with RW Goal status: MET - 08/30/23 - 13.0 sec with RW  6.  Patient will demonstrate gait speed of >/= 1.8 ft/sec (0.55 m/s) with walking sticks or LRAD to be a safe limited community ambulator with decreased risk for recurrent falls.  Baseline: 1.00 ft/sec Goal status: PARTIALLY MET - 08/30/23 - 2.70 ft/sec with RW  7.  Patient will improve Berg score by at least 8 points to improve safety and stability with ADLs in standing and reduce risk for falls. (MCID= 8 points)  Baseline: 26/56 (07/19/23) Goal status: IN PROGRESS -  08/30/23 - 32/56  8.  Patient will demonstrate at least 3 point improvement on DGI to improve gait stability and reduce risk for falls. Baseline: 10/24 (07/19/23) Goal status: MET - 08/30/23 - 13/24  9. Patient will report >/= 40% on ABC scale to demonstrate improved balance confidence with functional mobility and gait. Baseline: 360 / 1600 = 22.5 % Goal status: IN PROGRESS - 08/30/23 - 570 / 1600 = 35.6 %   PLAN:  PT FREQUENCY: 2x/week  PT DURATION: 6-7 weeks  PLANNED INTERVENTIONS: 97164- PT Re-evaluation, 97110-Therapeutic exercises, 97530- Therapeutic activity, 97112- Neuromuscular re-education, 97535- Self Care, [id number]- Manual therapy, [id number]- Gait training, Patient/Family education, Balance training, Stair training, Taping, Dry Needling, DME instructions, Cryotherapy, and Moist heat  PLAN FOR NEXT SESSION:  increase ankle weights (based on pt's response) to LE strengthening and static balance and update HEP  as indicated; stepping/weight shifting exercises; perturbations in standing; work towards gait training with hiking poles   Francisco Irving, PT 09/16/2023, 4:43 PM

## 2023-09-23 ENCOUNTER — Ambulatory Visit

## 2023-09-23 DIAGNOSIS — R260 Ataxic gait: Secondary | ICD-10-CM

## 2023-09-23 DIAGNOSIS — M6281 Muscle weakness (generalized): Secondary | ICD-10-CM

## 2023-09-23 DIAGNOSIS — R2681 Unsteadiness on feet: Secondary | ICD-10-CM | POA: Diagnosis not present

## 2023-09-23 NOTE — Therapy (Signed)
 OUTPATIENT PHYSICAL THERAPY TREATMENT     Patient Name: Andre [name] MRN: [id number] DOB:[birth year], 83 y.o., male Today's Date: 09/23/2023   END OF SESSION:  PT End of Session - 09/23/23 1444     Visit Number 15    Date for PT Re-Evaluation 10/18/23    Authorization Type [employer]    Progress Note Due on Visit 20    PT Start Time 1404    PT Stop Time 1444    PT Time Calculation (min) 40 min    Activity Tolerance Patient tolerated treatment well    Behavior During Therapy WFL for tasks assessed/performed                           Past Medical History:  Diagnosis Date   Carpal tunnel syndrome    Cerebellar ataxia (HCC)    spinal cerebellar ataxia   Dyslipidemia    H/O rotator cuff surgery    Obstructive sleep apnea    Past Surgical History:  Procedure Laterality Date   BYPASS GRAFT AORTA TO AORTA     quadruple bypass   CARPAL TUNNEL RELEASE Left    EYE SURGERY Bilateral    SHOULDER SURGERY     bilateral   TONSILLECTOMY     VASECTOMY     Patient Active Problem List   Diagnosis Date Noted   Osteoporosis, senile 08/27/2022   Neuropathy 12/12/2018   Cerebellar ataxia (HCC) 12/12/2018   Vitreous membranes and strands 10/20/2018   Bilateral cold feet 04/07/2017   Osteopenia of multiple sites 04/07/2017   Vitamin D deficiency 04/07/2017   CAD in native artery 03/09/2017   Old MI (myocardial infarction) 03/09/2017   Carpal tunnel syndrome, 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   Ulnar neuropathy at elbow, 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   PVC's (premature ventricular contractions) 10/09/2015   Chronic dryness of both eyes 09/26/2015   Hypermetropia 09/26/2015   Presbyopia 09/26/2015   Regular astigmatism 09/26/2015   Hypermetropia of both eyes 09/26/2015   Senile nuclear sclerosis 09/26/2015   Stationary peripheral pterygium of right eye 09/26/2015   Unspecified esotropia 09/26/2015   Ventral hernia without obstruction or gangrene 07/30/2015   Dyslipidemia 05/09/2015    OSA (obstructive sleep apnea) 05/09/2015   S/P CABG (coronary artery bypass graft) 05/09/2015   Unspecified atrial flutter (HCC) 05/09/2015    PCP: Jayne Mews, MD   REFERRING PROVIDER: Jayne Mews, MD   REFERRING DIAG:  R26.81 (ICD-10-CM) - Unsteadiness on feet R26.89 (ICD-10-CM) - Other abnormalities of gait and mobility  THERAPY DIAG:  Unsteadiness on feet  Ataxic gait  Muscle weakness (generalized)  RATIONALE FOR EVALUATION AND TREATMENT: Rehabilitation  ONSET DATE: Chronic  NEXT MD VISIT: 12/16/23   SUBJECTIVE:  SUBJECTIVE STATEMENT: Pt reports he is doing well.   Eval: Pt reports how he feels depends on how much sleep he gets.  Arrives to PT with RW but states he also has a 4WW and hiking poles which he uses intermixed. His wife feels like he has been more unsteady of late.  He notes some R knee pain a few months back but now resolved.  Works with Systems analyst 2x/wk - STS, dumbbells, planks, etc.  Sees a chiropractor for his back 1x/month.  Pt accompanied by: self  PAIN: Are you having pain? No  PERTINENT HISTORY:  Chronic spinal cerebellar ataxia, neuropathy, osteoporosis, R sided pelvic fracture s/p fall 05/2020, MI/CAD s/p CABG x 4, Aflutter, GERD, B shoulder surgery/RCR, L carpal tunnel release, OSA  PRECAUTIONS: Fall  RED FLAGS: None  WEIGHT BEARING RESTRICTIONS: No  FALLS:  Has patient fallen in last 6 months? No  LIVING ENVIRONMENT: Lives with: lives with their family Lives in: House/apartment Stairs: Yes: Internal: 14 steps; on right going up and on left going up and External: 1 or 3 steps; on right going up and on left going up Has following equipment at home: Environmental consultant - 2 wheeled, Environmental consultant - 4 wheeled, shower chair, Grab bars, and hiking  poles  OCCUPATION: Retired  PLOF: Independent, Independent with community mobility with device, and Leisure: community outings with friends, walking loops in driveway, driving with son (adult CP); works with Systems analyst 2x/wk - STS, dumbbells, planks, etc   PATIENT GOALS: "To feel more confident walking with the walking sticks."   OBJECTIVE: (objective measures completed at initial evaluation unless otherwise dated)  DIAGNOSTIC FINDINGS:  06/22/23 - XR L toes (Left great toe skin breakdown) IMPRESSION:  Mild soft tissue swelling. No radiographic evidence of osteomyelitis.  Sonogram negative for DVT  COGNITION: Overall cognitive status: Within functional limits for tasks assessed   SENSATION: B LE neuropathy - predominantly in B feet  COORDINATION: Gross motor mild impairment B LE  EDEMA:  Intermittent distal LE edema  POSTURE:  No Significant postural limitations  MUSCLE LENGTH: Hamstrings: severe tight B ITB: mod tight L>R Piriformis: mod/severe tight B Hip flexors: mod tight B Quads: mild tight B Heelcord:   LOWER EXTREMITY ROM:    Grossly WFL other than restrictions related to above muscle tightness  LOWER EXTREMITY MMT:    MMT Right eval Left eval R 08/10/23 L 08/10/23 R 09/09/23 L 09/09/23  Hip flexion 4+ 4 5 5 5 5   Hip extension 4 4 4+ 4+ 4+ 4+  Hip abduction 4 4 4 4  4+ 4+  Hip adduction 4+ 4+ 4+ 5 4+ 4+  Hip internal rotation 4+ 4+ 5 5 5 5   Hip external rotation 4 4 4+ 4+ 5 4+  Knee flexion 5 5 5 5 5  4+  Knee extension 5 5 5 5 5 5   Ankle dorsiflexion 4+ 4 5 4+    Ankle plantarflexion        Ankle inversion        Ankle eversion        (Blank rows = not tested)  BED MOBILITY:  Independent  TRANSFERS: Assistive device utilized: Environmental consultant - 2 wheeled  Sit to stand: SBA Stand to sit: SBA Chair to chair:   Floor:    GAIT: Distance walked: clinic distances Assistive device utilized: Environmental consultant - 2 wheeled Level of assistance: Modified independence and  SBA Gait pattern: step to pattern, decreased hip/knee flexion- Right, decreased hip/knee flexion- Left, ataxic, and trunk flexed  Comments:   RAMP: Level of Assistance: NT Assistive device utilized:   Ramp Comments:   CURB: (09/16/23) Level of Assistance: SBA Assistive device utilized: Environmental consultant - 2 wheeled Curb Comments:   STAIRS:  Level of Assistance: NT  Stair Negotiation Technique:   Number of Stairs:    Height of Stairs:   Comments:   FUNCTIONAL TESTS:  5 times sit to stand: 16.47 sec w/o UE assist Timed up and go (TUG): 20.63 sec with RW 10 meter walk test: 32.68 sec with RW; 1.00 ft/sec with RW  Berg Balance Scale: 26/56; Scores of <36 indicate high risk for falls (close to 100%) - 07/19/23 Dynamic Gait Index: 10/24; Scores of 19 or less are predictive of falls in older community living adults - 07/19/23 MCTSIB: Condition 1: Avg of 3 trials: 30 sec, Condition 2: Avg of 3 trials: 8.67 sec, Condition 3: Avg of 3 trials: 30 sec, Condition 4: Avg of 3 trials: 4 sec, and Total Score: 72.67/120  08/10/23: 5xSTS = 14.09 sec  08/30/23: TUG = 13.0 sec, >13.5 sec is predictive of falls = 12.16 sec Gait speed = 2.70 ft/sec with RW; >2.62 ft/sec indicates community ambulator Berg = 32/56, <36 = High risk for falls (close to 100%)  DGI = 13/24, scores of 19 or less are predictive of falls in older community living adults  PATIENT SURVEYS:  ABC scale 360 / 1600 = 22.5 %; <50% indicates low level of physical functioning and risk for recurrent falls 08/30/23 - 08/30/23 - 570 / 1600 = 35.6 %   TODAY'S TREATMENT:  09/23/23 THERAPEUTIC EXERCISE: To improve strength and endurance.  Demonstration, verbal and tactile cues throughout for technique. Bike L3x35min Leg curls 35lb 2x10 BLE Leg ext 25lb 2x10 BLE  NEUROMUSCULAR RE-EDUCATION: To improve balance, coordination, kinesthesia, posture, proprioception, reduce fall risk, and amplitude of movement.  TRX squats 2x10 TRX side lunges x 10  B TRX lateral toe tap x 10 Standing hip flexion, abd, ext, add GTB x 10 R/L each  09/16/23 THERAPEUTIC EXERCISE: To improve strength and endurance.  Demonstration, verbal and tactile cues throughout for technique.  NuStep - L6 x 6 min  GAIT TRAINING: To normalize gait pattern, improve safety with RW, and prepare to wean to LRAD.  350' with B hiking poles on level surfaces indoors with CGA of PT via gait belt - cues for placement of hiking pole more to front to improve upright alignment for better stability and avoid pole sliding out to rear 600+' with RW over variable surfaces indoors and outdoors with SBA to occasional CGA via gait belt - pt with tendency to reach more fwd with RW when lifting it over thresholds (elevator or doorway) and entrance rugs 200' with B hiking poles on slightly unlevel surfaces outdoors with CGA of PT via gait belt - continued cues for placement of hiking pole more to front to improve upright alignment for better stability and avoid pole sliding out to rear - pt tending to increase speed with fwd LOB requiring min A x 1 to prevent LOB  NEUROMUSCULAR RE-EDUCATION: To improve balance, coordination, kinesthesia, posture, proprioception, reduce fall risk, and amplitude of movement.  All activities performed with CGA/SBA of PT vis gait belt B Standing GTB pallof press x 10 with wide BOS, x 10 with standard BOS B Standing GTB trunk rotation x 10 with wide BOS Standing GTB B scap retraction + shoulder row x 15, BOS slightly wider than hip width Standing GTB B scap retraction +  shoulder extension x 15, BOS slightly wider than hip width Standing GTB B shoulder flexion x 15, BOS slightly wider than hip width   09/13/23 THERAPEUTIC EXERCISE: To improve strength and endurance.  Demonstration, verbal and tactile cues throughout for technique. Bike L3x37min Seated PF black TB 2x10 B Standing calf raise x 10- staggered with L behind   NEUROMUSCULAR RE-EDUCATION: To improve  proprioception, balance, and kinesthesia. TRX squats 2x10 TRX lunges each side x 10  Standing hip flexion (1HA), abduction (2HA) GTB 2x10 B Seated pallof press GTB x 10 B Seated trunk rotation GTB x 10 B   09/09/23 THERAPEUTIC EXERCISE: To improve strength and endurance.  Demonstration, verbal and tactile cues throughout for technique.  Nustep L6x16min UE/LE Le mmt Fwd step and reach to foam roll standing up Lateral step and reach to foam roll standing up Cone taps fwd, lateral and posterior Claf raises 2x6 Ankle pump with black TB x 10 B   PATIENT EDUCATION:  Education details: progress with PT, ongoing PT POC, and continue with current HEP  Person educated: Patient Education method: Explanation Education comprehension: verbalized understanding  HOME EXERCISE PROGRAM: Access Code: [email or web address] URL: [email or web address] Date: 09/09/2023 Prepared by: Zak Gondek  Exercises - Seated Hamstring Stretch  - 1-2 x daily - 7 x weekly - 3 reps - 30 sec hold - Seated Table Piriformis Stretch  - 1-2 x daily - 7 x weekly - 3 reps - 30 sec hold - Seated Hip Flexor Stretch  - 1-2 x daily - 7 x weekly - 3 reps - 30 sec hold - Heel Raises with Counter Support  - 1 x daily - 7 x weekly - 3 sets - 10 reps - Standing Hip Abduction with Counter Support  - 1 x daily - 7 x weekly - 3 sets - 10 reps - Standing Hip Extension with Counter Support  - 1 x daily - 7 x weekly - 3 sets - 10 reps - Standing March with Counter Support  - 1 x daily - 7 x weekly - 3 sets - 10 reps - Seated Ankle Plantarflexion with Resistance  - 1 x daily - 7 x weekly - 2-3 sets - 10 reps  Patient Education - Check for Safety  Patient Education - Check for Safety  ASSESSMENT:  CLINICAL IMPRESSION: Continued progressing exercises as tolerated. Worked on stabilization in standing and proximal LE strengthening. Close guarding with exercises required for stability with exercises.  Andre [name] will benefit from  continued skilled PT to address ongoing strength, coordination and balance deficits to improve mobility and activity tolerance with decreased risk for falls to help reach maximal level of functional independence.   OBJECTIVE IMPAIRMENTS: Abnormal gait, decreased activity tolerance, decreased balance, decreased coordination, decreased endurance, decreased knowledge of use of DME, decreased mobility, difficulty walking, decreased ROM, decreased strength, decreased safety awareness, impaired perceived functional ability, impaired flexibility, impaired sensation, improper body mechanics, and postural dysfunction.   ACTIVITY LIMITATIONS: carrying, lifting, bending, standing, squatting, stairs, transfers, bathing, dressing, reach over head, locomotion level, and caring for others  PARTICIPATION LIMITATIONS: meal prep, cleaning, laundry, driving, shopping, community activity, and yard work  PERSONAL FACTORS: Age, Past/current experiences, Time since onset of injury/illness/exacerbation, and 3+ comorbidities: Chronic spinal cerebellar ataxia, neuropathy, osteoporosis, R sided pelvic fracture s/p fall 05/2020, MI/CAD s/p CABG x 4, Aflutter, GERD, B shoulder surgery/RCR, L carpal tunnel release, OSA are also affecting patient's functional outcome.   REHAB POTENTIAL: Good  CLINICAL DECISION MAKING: Unstable/unpredictable  EVALUATION COMPLEXITY: High  GOALS: Goals reviewed with patient? Yes  SHORT TERM GOALS: Target date: 08/09/2023  Patient will be independent with initial HEP to improve outcomes and carryover.  Baseline:  Goal status: MET - 08/05/23  2.  Patient will be educated on strategies to decrease risk of falls.  Baseline:  Goal status: MET - 08/03/23 - Educated on Fall Prevention  3.  Patient will demonstrate decreased 5xSTS time to </= 14.8 sec to decrease risk for falls with transitional mobility. Baseline: 16.47 sec Goal status: MET - 08/10/23 - 14.09 sec  LONG TERM GOALS: Target date:  09/06/2023, extended to 10/18/2023   Patient will be independent with advanced/ongoing HEP to facilitate ability to maintain/progress functional gains from skilled physical therapy services. Baseline:  Goal status: IN PROGRESS - 08/30/23 - met for current HEP  2.  Patient will be able to ambulate 600' with LRAD on variable surfaces with good safety to access community.  Baseline:  Goal status: IN PROGRESS - 09/16/23 - Able to ambulate 600+' with RW over variable surfaces indoors and outdoors with CGA/SBA of PT- pt with tendency to reach more fwd with RW when lifting it over thresholds (elevator or doorway) and entrance rugs or walking up inclines but no LOB  3.  Patient will be able to step up/down curb safely with LRAD for safety with community ambulation.  Baseline:  Goal status: IN PROGRESS - 09/16/23 - Able to ascend/descend curb with RW with SBA of PT   4.  Patient will demonstrate improved B LE strength to >/= 4+/5 for improved stability and ease of mobility . Baseline: Refer to above LE MMT table Goal status: MET - 09/09/23  5.  Patient will improve TUG time to </= 15 seconds with walking sticks or LRAD for improved efficiency and safety with transfers. Baseline: 20.63 sec with RW Goal status: MET - 08/30/23 - 13.0 sec with RW  6.  Patient will demonstrate gait speed of >/= 1.8 ft/sec (0.55 m/s) with walking sticks or LRAD to be a safe limited community ambulator with decreased risk for recurrent falls.  Baseline: 1.00 ft/sec Goal status: PARTIALLY MET - 08/30/23 - 2.70 ft/sec with RW  7.  Patient will improve Berg score by at least 8 points to improve safety and stability with ADLs in standing and reduce risk for falls. (MCID= 8 points)  Baseline: 26/56 (07/19/23) Goal status: IN PROGRESS - 08/30/23 - 32/56  8.  Patient will demonstrate at least 3 point improvement on DGI to improve gait stability and reduce risk for falls. Baseline: 10/24 (07/19/23) Goal status: MET - 08/30/23 - 13/24  9.  Patient will report >/= 40% on ABC scale to demonstrate improved balance confidence with functional mobility and gait. Baseline: 360 / 1600 = 22.5 % Goal status: IN PROGRESS - 08/30/23 - 570 / 1600 = 35.6 %   PLAN:  PT FREQUENCY: 2x/week  PT DURATION: 6-7 weeks  PLANNED INTERVENTIONS: [id number]- PT Re-evaluation, 97110-Therapeutic exercises, 97530- Therapeutic activity, 97112- Neuromuscular re-education, 97535- Self Care, [id number]- Manual therapy, [phone]- Gait training, Patient/Family education, Balance training, Stair training, Taping, Dry Needling, DME instructions, Cryotherapy, and Moist heat  PLAN FOR NEXT SESSION:  increase ankle weights (based on pt's response) to LE strengthening and static balance and update HEP as indicated; stepping/weight shifting exercises; perturbations in standing; work towards gait training with hiking poles   [employer], PTA 09/23/2023, 3:12 PM

## 2023-09-27 ENCOUNTER — Ambulatory Visit: Attending: Family Medicine

## 2023-09-27 DIAGNOSIS — R260 Ataxic gait: Secondary | ICD-10-CM | POA: Diagnosis present

## 2023-09-27 DIAGNOSIS — M6281 Muscle weakness (generalized): Secondary | ICD-10-CM | POA: Insufficient documentation

## 2023-09-27 DIAGNOSIS — R2681 Unsteadiness on feet: Secondary | ICD-10-CM | POA: Diagnosis present

## 2023-09-27 NOTE — Therapy (Signed)
 OUTPATIENT PHYSICAL THERAPY TREATMENT     Patient Name: Andre [name] MRN: [id number] DOB:20-Sep-1940, 83 y.o., male Today's Date: 09/27/2023   END OF SESSION:  PT End of Session - 09/27/23 1452     Visit Number 16    Date for PT Re-Evaluation 10/18/23    Authorization Type [employer]    Progress Note Due on Visit 20    PT Start Time 1404    PT Stop Time 1452    PT Time Calculation (min) 48 min    Activity Tolerance Patient tolerated treatment well    Behavior During Therapy WFL for tasks assessed/performed                            Past Medical History:  Diagnosis Date   Carpal tunnel syndrome    Cerebellar ataxia (HCC)    spinal cerebellar ataxia   Dyslipidemia    H/O rotator cuff surgery    Obstructive sleep apnea    Past Surgical History:  Procedure Laterality Date   BYPASS GRAFT AORTA TO AORTA     quadruple bypass   CARPAL TUNNEL RELEASE Left    EYE SURGERY Bilateral    SHOULDER SURGERY     bilateral   TONSILLECTOMY     VASECTOMY     Patient Active Problem List   Diagnosis Date Noted   Osteoporosis, senile 08/27/2022   Neuropathy 12/12/2018   Cerebellar ataxia (HCC) 12/12/2018   Vitreous membranes and strands 10/20/2018   Bilateral cold feet 04/07/2017   Osteopenia of multiple sites 04/07/2017   Vitamin D deficiency 04/07/2017   CAD in native artery 03/09/2017   Old MI (myocardial infarction) 03/09/2017   Carpal tunnel syndrome, 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   Ulnar neuropathy at elbow, 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   PVC's (premature ventricular contractions) 10/09/2015   Chronic dryness of both eyes 09/26/2015   Hypermetropia 09/26/2015   Presbyopia 09/26/2015   Regular astigmatism 09/26/2015   Hypermetropia of both eyes 09/26/2015   Senile nuclear sclerosis 09/26/2015   Stationary peripheral pterygium of right eye 09/26/2015   Unspecified esotropia 09/26/2015   Ventral hernia without obstruction or gangrene 07/30/2015   Dyslipidemia 05/09/2015    OSA (obstructive sleep apnea) 05/09/2015   S/P CABG (coronary artery bypass graft) 05/09/2015   Unspecified atrial flutter (HCC) 05/09/2015    PCP: Jayne Mews, MD   REFERRING PROVIDER: Jayne Mews, MD   REFERRING DIAG:  R26.81 (ICD-10-CM) - Unsteadiness on feet R26.89 (ICD-10-CM) - Other abnormalities of gait and mobility  THERAPY DIAG:  Unsteadiness on feet  Ataxic gait  Muscle weakness (generalized)  RATIONALE FOR EVALUATION AND TREATMENT: Rehabilitation  ONSET DATE: Chronic  NEXT MD VISIT: 12/16/23   SUBJECTIVE:  SUBJECTIVE STATEMENT: Pt reports nothing new.   Eval: Pt reports how he feels depends on how much sleep he gets.  Arrives to PT with RW but states he also has a 4WW and hiking poles which he uses intermixed. His wife feels like he has been more unsteady of late.  He notes some R knee pain a few months back but now resolved.  Works with Systems analyst 2x/wk - STS, dumbbells, planks, etc.  Sees a chiropractor for his back 1x/month.  Pt accompanied by: self  PAIN: Are you having pain? No  PERTINENT HISTORY:  Chronic spinal cerebellar ataxia, neuropathy, osteoporosis, R sided pelvic fracture s/p fall 05/2020, MI/CAD s/p CABG x 4, Aflutter, GERD, B shoulder surgery/RCR, L carpal tunnel release, OSA  PRECAUTIONS: Fall  RED FLAGS: None  WEIGHT BEARING RESTRICTIONS: No  FALLS:  Has patient fallen in last 6 months? No  LIVING ENVIRONMENT: Lives with: lives with their family Lives in: House/apartment Stairs: Yes: Internal: 14 steps; on right going up and on left going up and External: 1 or 3 steps; on right going up and on left going up Has following equipment at home: Environmental consultant - 2 wheeled, Environmental consultant - 4 wheeled, shower chair, Grab bars, and hiking  poles  OCCUPATION: Retired  PLOF: Independent, Independent with community mobility with device, and Leisure: community outings with friends, walking loops in driveway, driving with son (adult CP); works with Systems analyst 2x/wk - STS, dumbbells, planks, etc   PATIENT GOALS: "To feel more confident walking with the walking sticks."   OBJECTIVE: (objective measures completed at initial evaluation unless otherwise dated)  DIAGNOSTIC FINDINGS:  06/22/23 - XR L toes (Left great toe skin breakdown) IMPRESSION:  Mild soft tissue swelling. No radiographic evidence of osteomyelitis.  Sonogram negative for DVT  COGNITION: Overall cognitive status: Within functional limits for tasks assessed   SENSATION: B LE neuropathy - predominantly in B feet  COORDINATION: Gross motor mild impairment B LE  EDEMA:  Intermittent distal LE edema  POSTURE:  No Significant postural limitations  MUSCLE LENGTH: Hamstrings: severe tight B ITB: mod tight L>R Piriformis: mod/severe tight B Hip flexors: mod tight B Quads: mild tight B Heelcord:   LOWER EXTREMITY ROM:    Grossly WFL other than restrictions related to above muscle tightness  LOWER EXTREMITY MMT:    MMT Right eval Left eval R 08/10/23 L 08/10/23 R 09/09/23 L 09/09/23  Hip flexion 4+ 4 5 5 5 5   Hip extension 4 4 4+ 4+ 4+ 4+  Hip abduction 4 4 4 4  4+ 4+  Hip adduction 4+ 4+ 4+ 5 4+ 4+  Hip internal rotation 4+ 4+ 5 5 5 5   Hip external rotation 4 4 4+ 4+ 5 4+  Knee flexion 5 5 5 5 5  4+  Knee extension 5 5 5 5 5 5   Ankle dorsiflexion 4+ 4 5 4+    Ankle plantarflexion        Ankle inversion        Ankle eversion        (Blank rows = not tested)  BED MOBILITY:  Independent  TRANSFERS: Assistive device utilized: Environmental consultant - 2 wheeled  Sit to stand: SBA Stand to sit: SBA Chair to chair:   Floor:    GAIT: Distance walked: clinic distances Assistive device utilized: Environmental consultant - 2 wheeled Level of assistance: Modified independence and  SBA Gait pattern: step to pattern, decreased hip/knee flexion- Right, decreased hip/knee flexion- Left, ataxic, and trunk flexed Comments:  RAMP: Level of Assistance: NT Assistive device utilized:   Ramp Comments:   CURB: (09/16/23) Level of Assistance: SBA Assistive device utilized: Environmental consultant - 2 wheeled Curb Comments:   STAIRS:  Level of Assistance: NT  Stair Negotiation Technique:   Number of Stairs:    Height of Stairs:   Comments:   FUNCTIONAL TESTS:  5 times sit to stand: 16.47 sec w/o UE assist Timed up and go (TUG): 20.63 sec with RW 10 meter walk test: 32.68 sec with RW; 1.00 ft/sec with RW  Berg Balance Scale: 26/56; Scores of <36 indicate high risk for falls (close to 100%) - 07/19/23 Dynamic Gait Index: 10/24; Scores of 19 or less are predictive of falls in older community living adults - 07/19/23 MCTSIB: Condition 1: Avg of 3 trials: 30 sec, Condition 2: Avg of 3 trials: 8.67 sec, Condition 3: Avg of 3 trials: 30 sec, Condition 4: Avg of 3 trials: 4 sec, and Total Score: 72.67/120  08/10/23: 5xSTS = 14.09 sec  08/30/23: TUG = 13.0 sec, >13.5 sec is predictive of falls = 12.16 sec Gait speed = 2.70 ft/sec with RW; >2.62 ft/sec indicates community ambulator Berg = 32/56, <36 = High risk for falls (close to 100%)  DGI = 13/24, scores of 19 or less are predictive of falls in older community living adults  PATIENT SURVEYS:  ABC scale 360 / 1600 = 22.5 %; <50% indicates low level of physical functioning and risk for recurrent falls 08/30/23 - 08/30/23 - 570 / 1600 = 35.6 %   TODAY'S TREATMENT:  09/23/23 THERAPEUTIC EXERCISE: To improve strength and endurance.  Demonstration, verbal and tactile cues throughout for technique. Bike L3x9min Leg extension 30lb 2x15 BLE Leg curls 40lb 2x15 BLE  NEUROMUSCULAR RE-EDUCATION: To improve balance, coordination, kinesthesia, posture, proprioception, reduce fall risk, and amplitude of movement.  Quadruped rock fwd and back  2x10 Quadruped fire hydrants 2x10 B Quadruped hip extension x 10 B Standing UE support using orange pball:  Hip abduction x 15 B  Hip extension 2x10 B  Modified bird dog x5 with RLE/LUE; unable with reciprocal side Leg extension 30lb 2x15 BLE  09/23/23 THERAPEUTIC EXERCISE: To improve strength and endurance.  Demonstration, verbal and tactile cues throughout for technique. Bike L3x23min Leg curls 35lb 2x10 BLE Leg ext 25lb 2x10 BLE  NEUROMUSCULAR RE-EDUCATION: To improve balance, coordination, kinesthesia, posture, proprioception, reduce fall risk, and amplitude of movement.  TRX squats 2x10 TRX side lunges x 10 B TRX lateral toe tap x 10 Standing hip flexion, abd, ext, add GTB x 10 R/L each  09/16/23 THERAPEUTIC EXERCISE: To improve strength and endurance.  Demonstration, verbal and tactile cues throughout for technique.  NuStep - L6 x 6 min  GAIT TRAINING: To normalize gait pattern, improve safety with RW, and prepare to wean to LRAD.  350' with B hiking poles on level surfaces indoors with CGA of PT via gait belt - cues for placement of hiking pole more to front to improve upright alignment for better stability and avoid pole sliding out to rear 600+' with RW over variable surfaces indoors and outdoors with SBA to occasional CGA via gait belt - pt with tendency to reach more fwd with RW when lifting it over thresholds (elevator or doorway) and entrance rugs 200' with B hiking poles on slightly unlevel surfaces outdoors with CGA of PT via gait belt - continued cues for placement of hiking pole more to front to improve upright alignment for better stability and avoid pole  sliding out to rear - pt tending to increase speed with fwd LOB requiring min A x 1 to prevent LOB  NEUROMUSCULAR RE-EDUCATION: To improve balance, coordination, kinesthesia, posture, proprioception, reduce fall risk, and amplitude of movement.  All activities performed with CGA/SBA of PT vis gait belt B Standing GTB  pallof press x 10 with wide BOS, x 10 with standard BOS B Standing GTB trunk rotation x 10 with wide BOS Standing GTB B scap retraction + shoulder row x 15, BOS slightly wider than hip width Standing GTB B scap retraction + shoulder extension x 15, BOS slightly wider than hip width Standing GTB B shoulder flexion x 15, BOS slightly wider than hip width   09/13/23 THERAPEUTIC EXERCISE: To improve strength and endurance.  Demonstration, verbal and tactile cues throughout for technique. Bike L3x16min Seated PF black TB 2x10 B Standing calf raise x 10- staggered with L behind   NEUROMUSCULAR RE-EDUCATION: To improve proprioception, balance, and kinesthesia. TRX squats 2x10 TRX lunges each side x 10  Standing hip flexion (1HA), abduction (2HA) GTB 2x10 B Seated pallof press GTB x 10 B Seated trunk rotation GTB x 10 B   09/09/23 THERAPEUTIC EXERCISE: To improve strength and endurance.  Demonstration, verbal and tactile cues throughout for technique.  Nustep L6x9min UE/LE Le mmt Fwd step and reach to foam roll standing up Lateral step and reach to foam roll standing up Cone taps fwd, lateral and posterior Claf raises 2x6 Ankle pump with black TB x 10 B   PATIENT EDUCATION:  Education details: progress with PT, ongoing PT POC, and continue with current HEP  Person educated: Patient Education method: Explanation Education comprehension: verbalized understanding  HOME EXERCISE PROGRAM: Access Code: [email or web address] URL: [email or web address] Date: 09/09/2023 Prepared by: Shiryl Ruddy  Exercises - Seated Hamstring Stretch  - 1-2 x daily - 7 x weekly - 3 reps - 30 sec hold - Seated Table Piriformis Stretch  - 1-2 x daily - 7 x weekly - 3 reps - 30 sec hold - Seated Hip Flexor Stretch  - 1-2 x daily - 7 x weekly - 3 reps - 30 sec hold - Heel Raises with Counter Support  - 1 x daily - 7 x weekly - 3 sets - 10 reps - Standing Hip Abduction with Counter Support  - 1 x daily - 7 x  weekly - 3 sets - 10 reps - Standing Hip Extension with Counter Support  - 1 x daily - 7 x weekly - 3 sets - 10 reps - Standing March with Counter Support  - 1 x daily - 7 x weekly - 3 sets - 10 reps - Seated Ankle Plantarflexion with Resistance  - 1 x daily - 7 x weekly - 2-3 sets - 10 reps  Patient Education - Check for Safety  Patient Education - Check for Safety  ASSESSMENT:  CLINICAL IMPRESSION: We progressed with more core stability exercises and balance activities. Pt continues to demonstrate difficulty with UE support using a variable surface. Introduced quadruped exercises with patient showing some difficulty with this as well. He had LOB but with therapist assist was able to recover.  Cameren will benefit from continued skilled PT to address ongoing strength, coordination and balance deficits to improve mobility and activity tolerance with decreased risk for falls to help reach maximal level of functional independence.   OBJECTIVE IMPAIRMENTS: Abnormal gait, decreased activity tolerance, decreased balance, decreased coordination, decreased endurance, decreased knowledge of use of DME, decreased mobility, difficulty walking, decreased  ROM, decreased strength, decreased safety awareness, impaired perceived functional ability, impaired flexibility, impaired sensation, improper body mechanics, and postural dysfunction.   ACTIVITY LIMITATIONS: carrying, lifting, bending, standing, squatting, stairs, transfers, bathing, dressing, reach over head, locomotion level, and caring for others  PARTICIPATION LIMITATIONS: meal prep, cleaning, laundry, driving, shopping, community activity, and yard work  PERSONAL FACTORS: Age, Past/current experiences, Time since onset of injury/illness/exacerbation, and 3+ comorbidities: Chronic spinal cerebellar ataxia, neuropathy, osteoporosis, R sided pelvic fracture s/p fall 05/2020, MI/CAD s/p CABG x 4, Aflutter, GERD, B shoulder surgery/RCR, L carpal tunnel  release, OSA are also affecting patient's functional outcome.   REHAB POTENTIAL: Good  CLINICAL DECISION MAKING: Unstable/unpredictable  EVALUATION COMPLEXITY: High   GOALS: Goals reviewed with patient? Yes  SHORT TERM GOALS: Target date: 08/09/2023  Patient will be independent with initial HEP to improve outcomes and carryover.  Baseline:  Goal status: MET - 08/05/23  2.  Patient will be educated on strategies to decrease risk of falls.  Baseline:  Goal status: MET - 08/03/23 - Educated on Fall Prevention  3.  Patient will demonstrate decreased 5xSTS time to </= 14.8 sec to decrease risk for falls with transitional mobility. Baseline: 16.47 sec Goal status: MET - 08/10/23 - 14.09 sec  LONG TERM GOALS: Target date: 09/06/2023, extended to 10/18/2023   Patient will be independent with advanced/ongoing HEP to facilitate ability to maintain/progress functional gains from skilled physical therapy services. Baseline:  Goal status: IN PROGRESS - 08/30/23 - met for current HEP  2.  Patient will be able to ambulate 600' with LRAD on variable surfaces with good safety to access community.  Baseline:  Goal status: IN PROGRESS - 09/16/23 - Able to ambulate 600+' with RW over variable surfaces indoors and outdoors with CGA/SBA of PT- pt with tendency to reach more fwd with RW when lifting it over thresholds (elevator or doorway) and entrance rugs or walking up inclines but no LOB  3.  Patient will be able to step up/down curb safely with LRAD for safety with community ambulation.  Baseline:  Goal status: IN PROGRESS - 09/16/23 - Able to ascend/descend curb with RW with SBA of PT   4.  Patient will demonstrate improved B LE strength to >/= 4+/5 for improved stability and ease of mobility . Baseline: Refer to above LE MMT table Goal status: MET - 09/09/23  5.  Patient will improve TUG time to </= 15 seconds with walking sticks or LRAD for improved efficiency and safety with transfers. Baseline:  20.63 sec with RW Goal status: MET - 08/30/23 - 13.0 sec with RW  6.  Patient will demonstrate gait speed of >/= 1.8 ft/sec (0.55 m/s) with walking sticks or LRAD to be a safe limited community ambulator with decreased risk for recurrent falls.  Baseline: 1.00 ft/sec Goal status: PARTIALLY MET - 08/30/23 - 2.70 ft/sec with RW  7.  Patient will improve Berg score by at least 8 points to improve safety and stability with ADLs in standing and reduce risk for falls. (MCID= 8 points)  Baseline: 26/56 (07/19/23) Goal status: IN PROGRESS - 08/30/23 - 32/56  8.  Patient will demonstrate at least 3 point improvement on DGI to improve gait stability and reduce risk for falls. Baseline: 10/24 (07/19/23) Goal status: MET - 08/30/23 - 13/24  9. Patient will report >/= 40% on ABC scale to demonstrate improved balance confidence with functional mobility and gait. Baseline: 360 / 1600 = 22.5 % Goal status: IN PROGRESS - 08/30/23 -  570 / 1600 = 35.6 %   PLAN:  PT FREQUENCY: 2x/week  PT DURATION: 6-7 weeks  PLANNED INTERVENTIONS: [id number]- PT Re-evaluation, 97110-Therapeutic exercises, 97530- Therapeutic activity, 97112- Neuromuscular re-education, 97535- Self Care, [id number]- Manual therapy, [phone]- Gait training, Patient/Family education, Balance training, Stair training, Taping, Dry Needling, DME instructions, Cryotherapy, and Moist heat  PLAN FOR NEXT SESSION:  increase ankle weights (based on pt's response) to LE strengthening and static balance and update HEP as indicated; stepping/weight shifting exercises; perturbations in standing; work towards gait training with hiking poles   Samuella Crocker, PTA 09/27/2023, 2:52 PM

## 2023-09-30 ENCOUNTER — Ambulatory Visit: Admitting: Physical Therapy

## 2023-09-30 ENCOUNTER — Encounter: Payer: Self-pay | Admitting: Physical Therapy

## 2023-09-30 DIAGNOSIS — R260 Ataxic gait: Secondary | ICD-10-CM

## 2023-09-30 DIAGNOSIS — R2681 Unsteadiness on feet: Secondary | ICD-10-CM | POA: Diagnosis not present

## 2023-09-30 DIAGNOSIS — M6281 Muscle weakness (generalized): Secondary | ICD-10-CM

## 2023-09-30 NOTE — Therapy (Signed)
 OUTPATIENT PHYSICAL THERAPY TREATMENT     Patient Name: Andre [name] MRN: [id number] DOB:[birth year], 83 y.o., male Today's Date: 09/30/2023   END OF SESSION:  PT End of Session - 09/30/23 1404     Visit Number 17    Date for PT Re-Evaluation 10/18/23    Authorization Type [employer]    Progress Note Due on Visit 20    PT Start Time 1404    PT Stop Time 1445    PT Time Calculation (min) 41 min    Activity Tolerance Patient tolerated treatment well    Behavior During Therapy WFL for tasks assessed/performed                   Past Medical History:  Diagnosis Date   Carpal tunnel syndrome    Cerebellar ataxia (HCC)    spinal cerebellar ataxia   Dyslipidemia    H/O rotator cuff surgery    Obstructive sleep apnea    Past Surgical History:  Procedure Laterality Date   BYPASS GRAFT AORTA TO AORTA     quadruple bypass   CARPAL TUNNEL RELEASE Left    EYE SURGERY Bilateral    SHOULDER SURGERY     bilateral   TONSILLECTOMY     VASECTOMY     Patient Active Problem List   Diagnosis Date Noted   Osteoporosis, senile 08/27/2022   Neuropathy 12/12/2018   Cerebellar ataxia (HCC) 12/12/2018   Vitreous membranes and strands 10/20/2018   Bilateral cold feet 04/07/2017   Osteopenia of multiple sites 04/07/2017   Vitamin D deficiency 04/07/2017   CAD in native artery 03/09/2017   Old MI (myocardial infarction) 03/09/2017   Carpal tunnel syndrome, 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   Ulnar neuropathy at elbow, 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   PVC's (premature ventricular contractions) 10/09/2015   Chronic dryness of both eyes 09/26/2015   Hypermetropia 09/26/2015   Presbyopia 09/26/2015   Regular astigmatism 09/26/2015   Hypermetropia of both eyes 09/26/2015   Senile nuclear sclerosis 09/26/2015   Stationary peripheral pterygium of right eye 09/26/2015   Unspecified esotropia 09/26/2015   Ventral hernia without obstruction or gangrene 07/30/2015   Dyslipidemia 05/09/2015   OSA  (obstructive sleep apnea) 05/09/2015   S/P CABG (coronary artery bypass graft) 05/09/2015   Unspecified atrial flutter (HCC) 05/09/2015    PCP: Jayne Mews, MD   REFERRING PROVIDER: Jayne Mews, MD   REFERRING DIAG:  R26.81 (ICD-10-CM) - Unsteadiness on feet R26.89 (ICD-10-CM) - Other abnormalities of gait and mobility  THERAPY DIAG:  Unsteadiness on feet  Ataxic gait  Muscle weakness (generalized)  RATIONALE FOR EVALUATION AND TREATMENT: Rehabilitation  ONSET DATE: Chronic  NEXT MD VISIT: 12/16/23   SUBJECTIVE:  SUBJECTIVE STATEMENT: Pt reports he has what he thinks is a corn or a wart on the bottom of his R foot that hurts when he walks - states he need to get an appointment with the podiatrist.   Eval: Pt reports how he feels depends on how much sleep he gets.  Arrives to PT with RW but states he also has a 4WW and hiking poles which he uses intermixed. His wife feels like he has been more unsteady of late.  He notes some R knee pain a few months back but now resolved.  Works with Systems analyst 2x/wk - STS, dumbbells, planks, etc.  Sees a chiropractor for his back 1x/month.  Pt accompanied by: self  PAIN: Are you having pain? No  PERTINENT HISTORY:  Chronic spinal cerebellar ataxia, neuropathy, osteoporosis, R sided pelvic fracture s/p fall 05/2020, MI/CAD s/p CABG x 4, Aflutter, GERD, B shoulder surgery/RCR, L carpal tunnel release, OSA  PRECAUTIONS: Fall  RED FLAGS: None  WEIGHT BEARING RESTRICTIONS: No  FALLS:  Has patient fallen in last 6 months? No  LIVING ENVIRONMENT: Lives with: lives with their family Lives in: House/apartment Stairs: Yes: Internal: 14 steps; on right going up and on left going up and External: 1 or 3 steps; on right going up and  on left going up Has following equipment at home: Environmental consultant - 2 wheeled, Environmental consultant - 4 wheeled, shower chair, Grab bars, and hiking poles  OCCUPATION: Retired  PLOF: Independent, Independent with community mobility with device, and Leisure: community outings with friends, walking loops in driveway, driving with son (adult CP); works with Systems analyst 2x/wk - STS, dumbbells, planks, etc   PATIENT GOALS: "To feel more confident walking with the walking sticks."   OBJECTIVE: (objective measures completed at initial evaluation unless otherwise dated)  DIAGNOSTIC FINDINGS:  06/22/23 - XR L toes (Left great toe skin breakdown) IMPRESSION:  Mild soft tissue swelling. No radiographic evidence of osteomyelitis.  Sonogram negative for DVT  COGNITION: Overall cognitive status: Within functional limits for tasks assessed   SENSATION: B LE neuropathy - predominantly in B feet  COORDINATION: Gross motor mild impairment B LE  EDEMA:  Intermittent distal LE edema  POSTURE:  No Significant postural limitations  MUSCLE LENGTH: Hamstrings: severe tight B ITB: mod tight L>R Piriformis: mod/severe tight B Hip flexors: mod tight B Quads: mild tight B Heelcord:   LOWER EXTREMITY ROM:    Grossly WFL other than restrictions related to above muscle tightness  LOWER EXTREMITY MMT:    MMT Right eval Left eval R 08/10/23 L 08/10/23 R 09/09/23 L 09/09/23  Hip flexion 4+ 4 5 5 5 5   Hip extension 4 4 4+ 4+ 4+ 4+  Hip abduction 4 4 4 4  4+ 4+  Hip adduction 4+ 4+ 4+ 5 4+ 4+  Hip internal rotation 4+ 4+ 5 5 5 5   Hip external rotation 4 4 4+ 4+ 5 4+  Knee flexion 5 5 5 5 5  4+  Knee extension 5 5 5 5 5 5   Ankle dorsiflexion 4+ 4 5 4+    Ankle plantarflexion        Ankle inversion        Ankle eversion        (Blank rows = not tested)  BED MOBILITY:  Independent  TRANSFERS: Assistive device utilized: Environmental consultant - 2 wheeled  Sit to stand: SBA Stand to sit: SBA Chair to chair:   Floor:     GAIT: Distance walked: clinic distances Assistive  device utilized: Environmental consultant - 2 wheeled Level of assistance: Modified independence and SBA Gait pattern: step to pattern, decreased hip/knee flexion- Right, decreased hip/knee flexion- Left, ataxic, and trunk flexed Comments:   RAMP: Level of Assistance: NT Assistive device utilized:   Ramp Comments:   CURB: (09/16/23) Level of Assistance: SBA Assistive device utilized: Environmental consultant - 2 wheeled Curb Comments:   STAIRS:  Level of Assistance: NT  Stair Negotiation Technique:   Number of Stairs:    Height of Stairs:   Comments:   FUNCTIONAL TESTS:  5 times sit to stand: 16.47 sec w/o UE assist Timed up and go (TUG): 20.63 sec with RW 10 meter walk test: 32.68 sec with RW; 1.00 ft/sec with RW  Berg Balance Scale: 26/56; Scores of <36 indicate high risk for falls (close to 100%) - 07/19/23 Dynamic Gait Index: 10/24; Scores of 19 or less are predictive of falls in older community living adults - 07/19/23 MCTSIB: Condition 1: Avg of 3 trials: 30 sec, Condition 2: Avg of 3 trials: 8.67 sec, Condition 3: Avg of 3 trials: 30 sec, Condition 4: Avg of 3 trials: 4 sec, and Total Score: 72.67/120  08/10/23: 5xSTS = 14.09 sec  08/30/23: TUG = 13.0 sec, >13.5 sec is predictive of falls = 12.16 sec Gait speed = 2.70 ft/sec with RW; >2.62 ft/sec indicates community ambulator Berg = 32/56, <36 = High risk for falls (close to 100%)  DGI = 13/24, scores of 19 or less are predictive of falls in older community living adults  09/30/23:  Randye Buttner = 35/56, <36 = High risk for falls (close to 100%)   PATIENT SURVEYS:  ABC scale 360 / 1600 = 22.5 %; <50% indicates low level of physical functioning and risk for recurrent falls 08/30/23 - 08/30/23 - 570 / 1600 = 35.6 %   TODAY'S TREATMENT:   09/30/23 THERAPEUTIC EXERCISE: To improve strength and endurance.  Demonstration, verbal and tactile cues throughout for technique.  Rec Bike - L3 x 6 min  PHYSICAL  PERFORMANCE TEST or MEASUREMENT: Berg Balance Test  Sit to Stand Able to stand without using hands and stabilize independently   Standing Unsupported Able to stand safely 2 minutes   Sitting with Back Unsupported but Feet Supported on Floor or Stool Able to sit safely and securely 2 minutes   Stand to Sit Sits safely with minimal use of hands   Transfers Able to transfer safely, definite need of hands   Standing Unsupported with Eyes Closed Able to stand 3 seconds   Standing Unsupported with Feet Together Needs help to attain position but able to stand for 30 seconds with feet together   From Standing, Reach Forward with Outstretched Arm Can reach forward >12 cm safely (5")   From Standing Position, Pick up Object from Floor Able to pick up shoe, needs supervision   From Standing Position, Turn to Look Behind Over each Shoulder Turn sideways only but maintains balance   Turn 360 Degrees Needs close supervision or verbal cueing   Standing Unsupported, Alternately Place Feet on Step/Stool Able to complete >2 steps/needs minimal assist   Standing Unsupported, One Foot in Front Able to take small step independently and hold 30 seconds   Standing on One Leg Tries to lift leg/unable to hold 3 seconds but remains standing independently   Total Score 35   Berg comment: <36 = High risk for falls (close to 100%)       GAIT TRAINING: To normalize gait pattern, improve  safety with hiking poles, and prepare to wean to LRAD.  400' with B hiking poles on level surfaces indoors with CGA/SBA of PT via gait belt - cues for placement of hiking pole more to front to improve upright alignment for better stability and avoid pole sliding out to rear   09/23/23 THERAPEUTIC EXERCISE: To improve strength and endurance.  Demonstration, verbal and tactile cues throughout for technique. Bike L3x32min Leg extension 30lb 2x15 BLE Leg curls 40lb 2x15 BLE  NEUROMUSCULAR RE-EDUCATION: To improve balance, coordination,  kinesthesia, posture, proprioception, reduce fall risk, and amplitude of movement.  Quadruped rock fwd and back 2x10 Quadruped fire hydrants 2x10 B Quadruped hip extension x 10 B Standing UE support using orange pball:  Hip abduction x 15 B  Hip extension 2x10 B  Modified bird dog x5 with RLE/LUE; unable with reciprocal side Leg extension 30lb 2x15 BLE   09/23/23 THERAPEUTIC EXERCISE: To improve strength and endurance.  Demonstration, verbal and tactile cues throughout for technique. Bike L3x58min Leg curls 35lb 2x10 BLE Leg ext 25lb 2x10 BLE  NEUROMUSCULAR RE-EDUCATION: To improve balance, coordination, kinesthesia, posture, proprioception, reduce fall risk, and amplitude of movement.  TRX squats 2x10 TRX side lunges x 10 B TRX lateral toe tap x 10 Standing hip flexion, abd, ext, add GTB x 10 R/L each   09/16/23 THERAPEUTIC EXERCISE: To improve strength and endurance.  Demonstration, verbal and tactile cues throughout for technique.  NuStep - L6 x 6 min  GAIT TRAINING: To normalize gait pattern, improve safety with RW, and prepare to wean to LRAD.  350' with B hiking poles on level surfaces indoors with CGA of PT via gait belt - cues for placement of hiking pole more to front to improve upright alignment for better stability and avoid pole sliding out to rear 600+' with RW over variable surfaces indoors and outdoors with SBA to occasional CGA via gait belt - pt with tendency to reach more fwd with RW when lifting it over thresholds (elevator or doorway) and entrance rugs 200' with B hiking poles on slightly unlevel surfaces outdoors with CGA of PT via gait belt - continued cues for placement of hiking pole more to front to improve upright alignment for better stability and avoid pole sliding out to rear - pt tending to increase speed with fwd LOB requiring min A x 1 to prevent LOB  NEUROMUSCULAR RE-EDUCATION: To improve balance, coordination, kinesthesia, posture, proprioception,  reduce fall risk, and amplitude of movement.  All activities performed with CGA/SBA of PT vis gait belt B Standing GTB pallof press x 10 with wide BOS, x 10 with standard BOS B Standing GTB trunk rotation x 10 with wide BOS Standing GTB B scap retraction + shoulder row x 15, BOS slightly wider than hip width Standing GTB B scap retraction + shoulder extension x 15, BOS slightly wider than hip width Standing GTB B shoulder flexion x 15, BOS slightly wider than hip width   09/13/23 THERAPEUTIC EXERCISE: To improve strength and endurance.  Demonstration, verbal and tactile cues throughout for technique. Bike L3x5min Seated PF black TB 2x10 B Standing calf raise x 10- staggered with L behind   NEUROMUSCULAR RE-EDUCATION: To improve proprioception, balance, and kinesthesia. TRX squats 2x10 TRX lunges each side x 10  Standing hip flexion (1HA), abduction (2HA) GTB 2x10 B Seated pallof press GTB x 10 B Seated trunk rotation GTB x 10 B   09/09/23 THERAPEUTIC EXERCISE: To improve strength and endurance.  Demonstration, verbal and tactile  cues throughout for technique.  Nustep L6x37min UE/LE Le mmt Fwd step and reach to foam roll standing up Lateral step and reach to foam roll standing up Cone taps fwd, lateral and posterior Claf raises 2x6 Ankle pump with black TB x 10 B   PATIENT EDUCATION:  Education details: progress with PT, ongoing PT POC, and continue with current HEP  Person educated: Patient Education method: Explanation Education comprehension: verbalized understanding  HOME EXERCISE PROGRAM: Access Code: [email or web address] URL: [email or web address] Date: 09/09/2023 Prepared by: Braylin Clark  Exercises - Seated Hamstring Stretch  - 1-2 x daily - 7 x weekly - 3 reps - 30 sec hold - Seated Table Piriformis Stretch  - 1-2 x daily - 7 x weekly - 3 reps - 30 sec hold - Seated Hip Flexor Stretch  - 1-2 x daily - 7 x weekly - 3 reps - 30 sec hold - Heel Raises with  Counter Support  - 1 x daily - 7 x weekly - 3 sets - 10 reps - Standing Hip Abduction with Counter Support  - 1 x daily - 7 x weekly - 3 sets - 10 reps - Standing Hip Extension with Counter Support  - 1 x daily - 7 x weekly - 3 sets - 10 reps - Standing March with Counter Support  - 1 x daily - 7 x weekly - 3 sets - 10 reps - Seated Ankle Plantarflexion with Resistance  - 1 x daily - 7 x weekly - 2-3 sets - 10 reps  Patient Education - Check for Safety  Patient Education - Check for Safety  ASSESSMENT:  CLINICAL IMPRESSION: Orbie continues to demonstrate improving core stability allowing for better control of his ataxia, with Berg score improved to 35/56 indicating an additional 3 point improvement from most recent testing.  Reattempted gait training with B hiking poles with continued wide BOS demonstrated but improving anterior/posterior control, although still with tendency to increase speed to try to correct for LOB.  The RW or 4WW/rollator remains the safest assistive device for him with ambulation currently.  Brach will benefit from continued skilled PT to address ongoing strength, coordination and balance deficits to improve mobility and activity tolerance with decreased risk for falls to help reach maximal level of functional independence.   OBJECTIVE IMPAIRMENTS: Abnormal gait, decreased activity tolerance, decreased balance, decreased coordination, decreased endurance, decreased knowledge of use of DME, decreased mobility, difficulty walking, decreased ROM, decreased strength, decreased safety awareness, impaired perceived functional ability, impaired flexibility, impaired sensation, improper body mechanics, and postural dysfunction.   ACTIVITY LIMITATIONS: carrying, lifting, bending, standing, squatting, stairs, transfers, bathing, dressing, reach over head, locomotion level, and caring for others  PARTICIPATION LIMITATIONS: meal prep, cleaning, laundry, driving, shopping, community  activity, and yard work  PERSONAL FACTORS: Age, Past/current experiences, Time since onset of injury/illness/exacerbation, and 3+ comorbidities: Chronic spinal cerebellar ataxia, neuropathy, osteoporosis, R sided pelvic fracture s/p fall 05/2020, MI/CAD s/p CABG x 4, Aflutter, GERD, B shoulder surgery/RCR, L carpal tunnel release, OSA are also affecting patient's functional outcome.   REHAB POTENTIAL: Good  CLINICAL DECISION MAKING: Unstable/unpredictable  EVALUATION COMPLEXITY: High   GOALS: Goals reviewed with patient? Yes  SHORT TERM GOALS: Target date: 08/09/2023  Patient will be independent with initial HEP to improve outcomes and carryover.  Baseline:  Goal status: MET - 08/05/23  2.  Patient will be educated on strategies to decrease risk of falls.  Baseline:  Goal status: MET - 08/03/23 - Educated on Fall Prevention  3.  Patient will demonstrate decreased 5xSTS time to </= 14.8 sec to decrease risk for falls with transitional mobility. Baseline: 16.47 sec Goal status: MET - 08/10/23 - 14.09 sec  LONG TERM GOALS: Target date: 09/06/2023, extended to 10/18/2023   Patient will be independent with advanced/ongoing HEP to facilitate ability to maintain/progress functional gains from skilled physical therapy services. Baseline:  Goal status: IN PROGRESS - 08/30/23 - met for current HEP  2.  Patient will be able to ambulate 600' with LRAD on variable surfaces with good safety to access community.  Baseline:  Goal status: IN PROGRESS - 09/16/23 - Able to ambulate 600+' with RW over variable surfaces indoors and outdoors with CGA/SBA of PT- pt with tendency to reach more fwd with RW when lifting it over thresholds (elevator or doorway) and entrance rugs or walking up inclines but no LOB  3.  Patient will be able to step up/down curb safely with LRAD for safety with community ambulation.  Baseline:  Goal status: IN PROGRESS - 09/16/23 - Able to ascend/descend curb with RW with SBA of PT    4.  Patient will demonstrate improved B LE strength to >/= 4+/5 for improved stability and ease of mobility . Baseline: Refer to above LE MMT table Goal status: MET - 09/09/23  5.  Patient will improve TUG time to </= 15 seconds with walking sticks or LRAD for improved efficiency and safety with transfers. Baseline: 20.63 sec with RW Goal status: MET - 08/30/23 - 13.0 sec with RW  6.  Patient will demonstrate gait speed of >/= 1.8 ft/sec (0.55 m/s) with walking sticks or LRAD to be a safe limited community ambulator with decreased risk for recurrent falls.  Baseline: 1.00 ft/sec Goal status: PARTIALLY MET - 08/30/23 - 2.70 ft/sec with RW  7.  Patient will improve Berg score by at least 8 points to improve safety and stability with ADLs in standing and reduce risk for falls. (MCID= 8 points)  Baseline: 26/56 (07/19/23) Goal status: IN PROGRESS - 08/30/23 - 32/56  8.  Patient will demonstrate at least 3 point improvement on DGI to improve gait stability and reduce risk for falls. Baseline: 10/24 (07/19/23) Goal status: MET - 08/30/23 - 13/24  9. Patient will report >/= 40% on ABC scale to demonstrate improved balance confidence with functional mobility and gait. Baseline: 360 / 1600 = 22.5 % Goal status: IN PROGRESS - 08/30/23 - 570 / 1600 = 35.6 %   PLAN:  PT FREQUENCY: 2x/week  PT DURATION: 6-7 weeks  PLANNED INTERVENTIONS: [id number]- PT Re-evaluation, 97110-Therapeutic exercises, 97530- Therapeutic activity, 97112- Neuromuscular re-education, 97535- Self Care, [id number]- Manual therapy, [phone]- Gait training, Patient/Family education, Balance training, Stair training, Taping, Dry Needling, DME instructions, Cryotherapy, and Moist heat  PLAN FOR NEXT SESSION:  increase ankle weights (based on pt's response) to LE strengthening and static balance and update HEP as indicated; stepping/weight shifting exercises; perturbations in standing; work towards gait training with hiking poles   Francisco Irving,  PT 09/30/2023, 8:39 PM

## 2023-10-04 ENCOUNTER — Ambulatory Visit

## 2023-10-04 DIAGNOSIS — R2681 Unsteadiness on feet: Secondary | ICD-10-CM

## 2023-10-04 DIAGNOSIS — M6281 Muscle weakness (generalized): Secondary | ICD-10-CM

## 2023-10-04 DIAGNOSIS — R260 Ataxic gait: Secondary | ICD-10-CM

## 2023-10-04 NOTE — Therapy (Signed)
 OUTPATIENT PHYSICAL THERAPY TREATMENT     Patient Name: Andre [name] MRN: [id number] DOB:[birth year], 83 y.o., male Today's Date: 10/04/2023   END OF SESSION:  PT End of Session - 10/04/23 1446     Visit Number 18    Date for PT Re-Evaluation 10/18/23    Authorization Type [employer]    Progress Note Due on Visit 20    PT Start Time 1401    PT Stop Time 1444    PT Time Calculation (min) 43 min    Activity Tolerance Patient tolerated treatment well    Behavior During Therapy WFL for tasks assessed/performed                    Past Medical History:  Diagnosis Date   Carpal tunnel syndrome    Cerebellar ataxia (HCC)    spinal cerebellar ataxia   Dyslipidemia    H/O rotator cuff surgery    Obstructive sleep apnea    Past Surgical History:  Procedure Laterality Date   BYPASS GRAFT AORTA TO AORTA     quadruple bypass   CARPAL TUNNEL RELEASE Left    EYE SURGERY Bilateral    SHOULDER SURGERY     bilateral   TONSILLECTOMY     VASECTOMY     Patient Active Problem List   Diagnosis Date Noted   Osteoporosis, senile 08/27/2022   Neuropathy 12/12/2018   Cerebellar ataxia (HCC) 12/12/2018   Vitreous membranes and strands 10/20/2018   Bilateral cold feet 04/07/2017   Osteopenia of multiple sites 04/07/2017   Vitamin D deficiency 04/07/2017   CAD in native artery 03/09/2017   Old MI (myocardial infarction) 03/09/2017   Carpal tunnel syndrome, 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   Ulnar neuropathy at elbow, 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   PVC's (premature ventricular contractions) 10/09/2015   Chronic dryness of both eyes 09/26/2015   Hypermetropia 09/26/2015   Presbyopia 09/26/2015   Regular astigmatism 09/26/2015   Hypermetropia of both eyes 09/26/2015   Senile nuclear sclerosis 09/26/2015   Stationary peripheral pterygium of right eye 09/26/2015   Unspecified esotropia 09/26/2015   Ventral hernia without obstruction or gangrene 07/30/2015   Dyslipidemia 05/09/2015   OSA  (obstructive sleep apnea) 05/09/2015   S/P CABG (coronary artery bypass graft) 05/09/2015   Unspecified atrial flutter (HCC) 05/09/2015    PCP: Jayne Mews, MD   REFERRING PROVIDER: Jayne Mews, MD   REFERRING DIAG:  R26.81 (ICD-10-CM) - Unsteadiness on feet R26.89 (ICD-10-CM) - Other abnormalities of gait and mobility  THERAPY DIAG:  Unsteadiness on feet  Ataxic gait  Muscle weakness (generalized)  RATIONALE FOR EVALUATION AND TREATMENT: Rehabilitation  ONSET DATE: Chronic  NEXT MD VISIT: 12/16/23   SUBJECTIVE:  SUBJECTIVE STATEMENT: Pt reports he is doing good  Eval: Pt reports how he feels depends on how much sleep he gets.  Arrives to PT with RW but states he also has a 4WW and hiking poles which he uses intermixed. His wife feels like he has been more unsteady of late.  He notes some R knee pain a few months back but now resolved.  Works with Systems analyst 2x/wk - STS, dumbbells, planks, etc.  Sees a chiropractor for his back 1x/month.  Pt accompanied by: self  PAIN: Are you having pain? No  PERTINENT HISTORY:  Chronic spinal cerebellar ataxia, neuropathy, osteoporosis, R sided pelvic fracture s/p fall 05/2020, MI/CAD s/p CABG x 4, Aflutter, GERD, B shoulder surgery/RCR, L carpal tunnel release, OSA  PRECAUTIONS: Fall  RED FLAGS: None  WEIGHT BEARING RESTRICTIONS: No  FALLS:  Has patient fallen in last 6 months? No  LIVING ENVIRONMENT: Lives with: lives with their family Lives in: House/apartment Stairs: Yes: Internal: 14 steps; on right going up and on left going up and External: 1 or 3 steps; on right going up and on left going up Has following equipment at home: Environmental consultant - 2 wheeled, Environmental consultant - 4 wheeled, shower chair, Grab bars, and hiking  poles  OCCUPATION: Retired  PLOF: Independent, Independent with community mobility with device, and Leisure: community outings with friends, walking loops in driveway, driving with son (adult CP); works with Systems analyst 2x/wk - STS, dumbbells, planks, etc   PATIENT GOALS: "To feel more confident walking with the walking sticks."   OBJECTIVE: (objective measures completed at initial evaluation unless otherwise dated)  DIAGNOSTIC FINDINGS:  06/22/23 - XR L toes (Left great toe skin breakdown) IMPRESSION:  Mild soft tissue swelling. No radiographic evidence of osteomyelitis.  Sonogram negative for DVT  COGNITION: Overall cognitive status: Within functional limits for tasks assessed   SENSATION: B LE neuropathy - predominantly in B feet  COORDINATION: Gross motor mild impairment B LE  EDEMA:  Intermittent distal LE edema  POSTURE:  No Significant postural limitations  MUSCLE LENGTH: Hamstrings: severe tight B ITB: mod tight L>R Piriformis: mod/severe tight B Hip flexors: mod tight B Quads: mild tight B Heelcord:   LOWER EXTREMITY ROM:    Grossly WFL other than restrictions related to above muscle tightness  LOWER EXTREMITY MMT:    MMT Right eval Left eval R 08/10/23 L 08/10/23 R 09/09/23 L 09/09/23  Hip flexion 4+ 4 5 5 5 5   Hip extension 4 4 4+ 4+ 4+ 4+  Hip abduction 4 4 4 4  4+ 4+  Hip adduction 4+ 4+ 4+ 5 4+ 4+  Hip internal rotation 4+ 4+ 5 5 5 5   Hip external rotation 4 4 4+ 4+ 5 4+  Knee flexion 5 5 5 5 5  4+  Knee extension 5 5 5 5 5 5   Ankle dorsiflexion 4+ 4 5 4+    Ankle plantarflexion        Ankle inversion        Ankle eversion        (Blank rows = not tested)  BED MOBILITY:  Independent  TRANSFERS: Assistive device utilized: Environmental consultant - 2 wheeled  Sit to stand: SBA Stand to sit: SBA Chair to chair:   Floor:    GAIT: Distance walked: clinic distances Assistive device utilized: Environmental consultant - 2 wheeled Level of assistance: Modified independence and  SBA Gait pattern: step to pattern, decreased hip/knee flexion- Right, decreased hip/knee flexion- Left, ataxic, and trunk flexed Comments:  RAMP: Level of Assistance: NT Assistive device utilized:   Ramp Comments:   CURB: (09/16/23) Level of Assistance: SBA Assistive device utilized: Environmental consultant - 2 wheeled Curb Comments:   STAIRS:  Level of Assistance: NT  Stair Negotiation Technique:   Number of Stairs:    Height of Stairs:   Comments:   FUNCTIONAL TESTS:  5 times sit to stand: 16.47 sec w/o UE assist Timed up and go (TUG): 20.63 sec with RW 10 meter walk test: 32.68 sec with RW; 1.00 ft/sec with RW  Berg Balance Scale: 26/56; Scores of <36 indicate high risk for falls (close to 100%) - 07/19/23 Dynamic Gait Index: 10/24; Scores of 19 or less are predictive of falls in older community living adults - 07/19/23 MCTSIB: Condition 1: Avg of 3 trials: 30 sec, Condition 2: Avg of 3 trials: 8.67 sec, Condition 3: Avg of 3 trials: 30 sec, Condition 4: Avg of 3 trials: 4 sec, and Total Score: 72.67/120  08/10/23: 5xSTS = 14.09 sec  08/30/23: TUG = 13.0 sec, >13.5 sec is predictive of falls = 12.16 sec Gait speed = 2.70 ft/sec with RW; >2.62 ft/sec indicates community ambulator Berg = 32/56, <36 = High risk for falls (close to 100%)  DGI = 13/24, scores of 19 or less are predictive of falls in older community living adults  09/30/23:  Randye Buttner = 35/56, <36 = High risk for falls (close to 100%)   PATIENT SURVEYS:  ABC scale 360 / 1600 = 22.5 %; <50% indicates low level of physical functioning and risk for recurrent falls 08/30/23 - 08/30/23 - 570 / 1600 = 35.6 %   TODAY'S TREATMENT:  10/04/23 THERAPEUTIC EXERCISE: To improve strength and endurance.  Demonstration, verbal and tactile cues throughout for technique.  Rec Bike - L4 x 6 min Standing hip kicks and up/over x 10 NEUROMUSCULAR RE-EDUCATION: To improve balance, coordination, kinesthesia, posture, proprioception, reduce fall risk,  and amplitude of movement.  Sidesteps along counter 3x- intermittent UE uspport Braiding along counter 3x- fingertips support B Fwd step and reaching x 10 Posterior toe tap and reach x 10  Standing GTB B scap retraction + shoulder row x 20, BOS slightly wider than hip width Standing GTB B scap retraction + shoulder extension x 20, BOS slightly wider than hip width Standing GTB B shoulder ext + circles alongside B hips x 10- very challenging  09/30/23 THERAPEUTIC EXERCISE: To improve strength and endurance.  Demonstration, verbal and tactile cues throughout for technique.  Rec Bike - L3 x 6 min  PHYSICAL PERFORMANCE TEST or MEASUREMENT: Berg Balance Test  Sit to Stand Able to stand without using hands and stabilize independently   Standing Unsupported Able to stand safely 2 minutes   Sitting with Back Unsupported but Feet Supported on Floor or Stool Able to sit safely and securely 2 minutes   Stand to Sit Sits safely with minimal use of hands   Transfers Able to transfer safely, definite need of hands   Standing Unsupported with Eyes Closed Able to stand 3 seconds   Standing Unsupported with Feet Together Needs help to attain position but able to stand for 30 seconds with feet together   From Standing, Reach Forward with Outstretched Arm Can reach forward >12 cm safely (5")   From Standing Position, Pick up Object from Floor Able to pick up shoe, needs supervision   From Standing Position, Turn to Look Behind Over each Shoulder Turn sideways only but maintains balance   Turn 360  Degrees Needs close supervision or verbal cueing   Standing Unsupported, Alternately Place Feet on Step/Stool Able to complete >2 steps/needs minimal assist   Standing Unsupported, One Foot in Front Able to take small step independently and hold 30 seconds   Standing on One Leg Tries to lift leg/unable to hold 3 seconds but remains standing independently   Total Score 35   Berg comment: <36 = High risk for falls  (close to 100%)       GAIT TRAINING: To normalize gait pattern, improve safety with hiking poles, and prepare to wean to LRAD.  400' with B hiking poles on level surfaces indoors with CGA/SBA of PT via gait belt - cues for placement of hiking pole more to front to improve upright alignment for better stability and avoid pole sliding out to rear   09/23/23 THERAPEUTIC EXERCISE: To improve strength and endurance.  Demonstration, verbal and tactile cues throughout for technique. Bike L3x84min Leg extension 30lb 2x15 BLE Leg curls 40lb 2x15 BLE  NEUROMUSCULAR RE-EDUCATION: To improve balance, coordination, kinesthesia, posture, proprioception, reduce fall risk, and amplitude of movement.  Quadruped rock fwd and back 2x10 Quadruped fire hydrants 2x10 B Quadruped hip extension x 10 B Standing UE support using orange pball:  Hip abduction x 15 B  Hip extension 2x10 B  Modified bird dog x5 with RLE/LUE; unable with reciprocal side Leg extension 30lb 2x15 BLE   09/23/23 THERAPEUTIC EXERCISE: To improve strength and endurance.  Demonstration, verbal and tactile cues throughout for technique. Bike L3x57min Leg curls 35lb 2x10 BLE Leg ext 25lb 2x10 BLE  NEUROMUSCULAR RE-EDUCATION: To improve balance, coordination, kinesthesia, posture, proprioception, reduce fall risk, and amplitude of movement.  TRX squats 2x10 TRX side lunges x 10 B TRX lateral toe tap x 10 Standing hip flexion, abd, ext, add GTB x 10 R/L each   09/16/23 THERAPEUTIC EXERCISE: To improve strength and endurance.  Demonstration, verbal and tactile cues throughout for technique.  NuStep - L6 x 6 min  GAIT TRAINING: To normalize gait pattern, improve safety with RW, and prepare to wean to LRAD.  350' with B hiking poles on level surfaces indoors with CGA of PT via gait belt - cues for placement of hiking pole more to front to improve upright alignment for better stability and avoid pole sliding out to rear 600+' with RW over  variable surfaces indoors and outdoors with SBA to occasional CGA via gait belt - pt with tendency to reach more fwd with RW when lifting it over thresholds (elevator or doorway) and entrance rugs 200' with B hiking poles on slightly unlevel surfaces outdoors with CGA of PT via gait belt - continued cues for placement of hiking pole more to front to improve upright alignment for better stability and avoid pole sliding out to rear - pt tending to increase speed with fwd LOB requiring min A x 1 to prevent LOB  NEUROMUSCULAR RE-EDUCATION: To improve balance, coordination, kinesthesia, posture, proprioception, reduce fall risk, and amplitude of movement.  All activities performed with CGA/SBA of PT vis gait belt B Standing GTB pallof press x 10 with wide BOS, x 10 with standard BOS B Standing GTB trunk rotation x 10 with wide BOS Standing GTB B scap retraction + shoulder row x 15, BOS slightly wider than hip width Standing GTB B scap retraction + shoulder extension x 15, BOS slightly wider than hip width Standing GTB B shoulder flexion x 15, BOS slightly wider than hip width   09/13/23 THERAPEUTIC  EXERCISE: To improve strength and endurance.  Demonstration, verbal and tactile cues throughout for technique. Bike L3x1min Seated PF black TB 2x10 B Standing calf raise x 10- staggered with L behind   NEUROMUSCULAR RE-EDUCATION: To improve proprioception, balance, and kinesthesia. TRX squats 2x10 TRX lunges each side x 10  Standing hip flexion (1HA), abduction (2HA) GTB 2x10 B Seated pallof press GTB x 10 B Seated trunk rotation GTB x 10 B   09/09/23 THERAPEUTIC EXERCISE: To improve strength and endurance.  Demonstration, verbal and tactile cues throughout for technique.  Nustep L6x74min UE/LE Le mmt Fwd step and reach to foam roll standing up Lateral step and reach to foam roll standing up Cone taps fwd, lateral and posterior Claf raises 2x6 Ankle pump with black TB x 10 B   PATIENT  EDUCATION:  Education details: progress with PT, ongoing PT POC, and continue with current HEP  Person educated: Patient Education method: Explanation Education comprehension: verbalized understanding  HOME EXERCISE PROGRAM: Access Code: [email or web address] URL: [email or web address] Date: 09/09/2023 Prepared by: Keyvin Rison  Exercises - Seated Hamstring Stretch  - 1-2 x daily - 7 x weekly - 3 reps - 30 sec hold - Seated Table Piriformis Stretch  - 1-2 x daily - 7 x weekly - 3 reps - 30 sec hold - Seated Hip Flexor Stretch  - 1-2 x daily - 7 x weekly - 3 reps - 30 sec hold - Heel Raises with Counter Support  - 1 x daily - 7 x weekly - 3 sets - 10 reps - Standing Hip Abduction with Counter Support  - 1 x daily - 7 x weekly - 3 sets - 10 reps - Standing Hip Extension with Counter Support  - 1 x daily - 7 x weekly - 3 sets - 10 reps - Standing March with Counter Support  - 1 x daily - 7 x weekly - 3 sets - 10 reps - Seated Ankle Plantarflexion with Resistance  - 1 x daily - 7 x weekly - 2-3 sets - 10 reps  Patient Education - Check for Safety  Patient Education - Check for Safety  ASSESSMENT:  CLINICAL IMPRESSION: We progressed with more core stability exercises and balance activities. Pt continues to demonstrate difficulty balancing w/o UE support. He had LOB but with therapist assist was able to recover.  Ryheem will benefit from continued skilled PT to address ongoing strength, coordination and balance deficits to improve mobility and activity tolerance with decreased risk for falls to help reach maximal level of functional independence.   OBJECTIVE IMPAIRMENTS: Abnormal gait, decreased activity tolerance, decreased balance, decreased coordination, decreased endurance, decreased knowledge of use of DME, decreased mobility, difficulty walking, decreased ROM, decreased strength, decreased safety awareness, impaired perceived functional ability, impaired flexibility, impaired  sensation, improper body mechanics, and postural dysfunction.   ACTIVITY LIMITATIONS: carrying, lifting, bending, standing, squatting, stairs, transfers, bathing, dressing, reach over head, locomotion level, and caring for others  PARTICIPATION LIMITATIONS: meal prep, cleaning, laundry, driving, shopping, community activity, and yard work  PERSONAL FACTORS: Age, Past/current experiences, Time since onset of injury/illness/exacerbation, and 3+ comorbidities: Chronic spinal cerebellar ataxia, neuropathy, osteoporosis, R sided pelvic fracture s/p fall 05/2020, MI/CAD s/p CABG x 4, Aflutter, GERD, B shoulder surgery/RCR, L carpal tunnel release, OSA are also affecting patient's functional outcome.   REHAB POTENTIAL: Good  CLINICAL DECISION MAKING: Unstable/unpredictable  EVALUATION COMPLEXITY: High   GOALS: Goals reviewed with patient? Yes  SHORT TERM GOALS: Target date: 08/09/2023  Patient will be independent  with initial HEP to improve outcomes and carryover.  Baseline:  Goal status: MET - 08/05/23  2.  Patient will be educated on strategies to decrease risk of falls.  Baseline:  Goal status: MET - 08/03/23 - Educated on Fall Prevention  3.  Patient will demonstrate decreased 5xSTS time to </= 14.8 sec to decrease risk for falls with transitional mobility. Baseline: 16.47 sec Goal status: MET - 08/10/23 - 14.09 sec  LONG TERM GOALS: Target date: 09/06/2023, extended to 10/18/2023   Patient will be independent with advanced/ongoing HEP to facilitate ability to maintain/progress functional gains from skilled physical therapy services. Baseline:  Goal status: IN PROGRESS - 08/30/23 - met for current HEP  2.  Patient will be able to ambulate 600' with LRAD on variable surfaces with good safety to access community.  Baseline:  Goal status: IN PROGRESS - 09/16/23 - Able to ambulate 600+' with RW over variable surfaces indoors and outdoors with CGA/SBA of PT- pt with tendency to reach more fwd  with RW when lifting it over thresholds (elevator or doorway) and entrance rugs or walking up inclines but no LOB  3.  Patient will be able to step up/down curb safely with LRAD for safety with community ambulation.  Baseline:  Goal status: IN PROGRESS - 09/16/23 - Able to ascend/descend curb with RW with SBA of PT   4.  Patient will demonstrate improved B LE strength to >/= 4+/5 for improved stability and ease of mobility . Baseline: Refer to above LE MMT table Goal status: MET - 09/09/23  5.  Patient will improve TUG time to </= 15 seconds with walking sticks or LRAD for improved efficiency and safety with transfers. Baseline: 20.63 sec with RW Goal status: MET - 08/30/23 - 13.0 sec with RW  6.  Patient will demonstrate gait speed of >/= 1.8 ft/sec (0.55 m/s) with walking sticks or LRAD to be a safe limited community ambulator with decreased risk for recurrent falls.  Baseline: 1.00 ft/sec Goal status: PARTIALLY MET - 08/30/23 - 2.70 ft/sec with RW  7.  Patient will improve Berg score by at least 8 points to improve safety and stability with ADLs in standing and reduce risk for falls. (MCID= 8 points)  Baseline: 26/56 (07/19/23) Goal status: IN PROGRESS - 08/30/23 - 32/56  8.  Patient will demonstrate at least 3 point improvement on DGI to improve gait stability and reduce risk for falls. Baseline: 10/24 (07/19/23) Goal status: MET - 08/30/23 - 13/24  9. Patient will report >/= 40% on ABC scale to demonstrate improved balance confidence with functional mobility and gait. Baseline: 360 / 1600 = 22.5 % Goal status: IN PROGRESS - 08/30/23 - 570 / 1600 = 35.6 %   PLAN:  PT FREQUENCY: 2x/week  PT DURATION: 6-7 weeks  PLANNED INTERVENTIONS: 97164- PT Re-evaluation, 97110-Therapeutic exercises, 97530- Therapeutic activity, 97112- Neuromuscular re-education, 97535- Self Care, 16109- Manual therapy, [phone]- Gait training, Patient/Family education, Balance training, Stair training, Taping, Dry  Needling, DME instructions, Cryotherapy, and Moist heat  PLAN FOR NEXT SESSION:  increase ankle weights (based on pt's response) to LE strengthening and static balance and update HEP as indicated; stepping/weight shifting exercises; perturbations in standing; work towards gait training with hiking poles   [employer], PTA 10/04/2023, 3:05 PM

## 2023-10-05 ENCOUNTER — Ambulatory Visit: Payer: Medicare HMO

## 2023-10-05 ENCOUNTER — Telehealth: Payer: Self-pay

## 2023-10-05 VITALS — BP 167/84 | HR 61 | Temp 98.4°F | Resp 18 | Ht 71.75 in | Wt 213.4 lb

## 2023-10-05 DIAGNOSIS — M810 Age-related osteoporosis without current pathological fracture: Secondary | ICD-10-CM

## 2023-10-05 MED ORDER — DENOSUMAB 60 MG/ML ~~LOC~~ SOSY
60.0000 mg | PREFILLED_SYRINGE | Freq: Once | SUBCUTANEOUS | Status: AC
  Administered 2023-10-05: 60 mg via SUBCUTANEOUS
  Filled 2023-10-05: qty 1

## 2023-10-05 NOTE — Telephone Encounter (Signed)
 Spoke with Gregary Lean in Medical Records at [facility] (office of Dr. Rosslyn Coons, MD). States calcium  value was 9.5 on 03/09/2023. Provided Gregary Lean with clinic fax number to send most recent labwork for patient.   Lendel Quant, RN

## 2023-10-05 NOTE — Progress Notes (Signed)
 Diagnosis: Osteoporosis  Provider:  Chilton Greathouse MD  Procedure: Injection  Prolia (Denosumab), Dose: 60 mg, Site: subcutaneous, Number of injections: 1  Injection Site(s): Right arm  Post Care: right arm injection  Discharge: Condition: Good, Destination: Home . AVS Declined  Performed by:  Rico Ala, LPN

## 2023-10-07 ENCOUNTER — Ambulatory Visit: Admitting: Physical Therapy

## 2023-10-07 ENCOUNTER — Encounter: Payer: Self-pay | Admitting: Physical Therapy

## 2023-10-07 DIAGNOSIS — M6281 Muscle weakness (generalized): Secondary | ICD-10-CM

## 2023-10-07 DIAGNOSIS — R260 Ataxic gait: Secondary | ICD-10-CM

## 2023-10-07 DIAGNOSIS — R2681 Unsteadiness on feet: Secondary | ICD-10-CM

## 2023-10-07 NOTE — Therapy (Signed)
 OUTPATIENT PHYSICAL THERAPY TREATMENT    Progress Note Reporting Period 08/30/2023 to 10/07/2023   See note below for Objective Data and Assessment of Progress/Goals.   Patient Name: Andre [name] MRN: [id number] DOB:05/07/1940, 83 y.o., male Today's Date: 10/07/2023   END OF SESSION:  PT End of Session - 10/07/23 1400     Visit Number 19    Date for PT Re-Evaluation 10/18/23    Authorization Type [employer]    Progress Note Due on Visit 29   PN on visit #19 - 10/07/23   PT Start Time 1400    PT Stop Time 1454    PT Time Calculation (min) 54 min    Activity Tolerance Patient tolerated treatment well    Behavior During Therapy WFL for tasks assessed/performed                  Past Medical History:  Diagnosis Date   Carpal tunnel syndrome    Cerebellar ataxia (HCC)    spinal cerebellar ataxia   Dyslipidemia    H/O rotator cuff surgery    Obstructive sleep apnea    Past Surgical History:  Procedure Laterality Date   BYPASS GRAFT AORTA TO AORTA     quadruple bypass   CARPAL TUNNEL RELEASE Left    EYE SURGERY Bilateral    SHOULDER SURGERY     bilateral   TONSILLECTOMY     VASECTOMY     Patient Active Problem List   Diagnosis Date Noted   Osteoporosis, senile 08/27/2022   Neuropathy 12/12/2018   Cerebellar ataxia (HCC) 12/12/2018   Vitreous membranes and strands 10/20/2018   Bilateral cold feet 04/07/2017   Osteopenia of multiple sites 04/07/2017   Vitamin D deficiency 04/07/2017   CAD in native artery 03/09/2017   Old MI (myocardial infarction) 03/09/2017   Carpal tunnel syndrome, 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   Ulnar neuropathy at elbow, 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   PVC's (premature ventricular contractions) 10/09/2015   Chronic dryness of both eyes 09/26/2015   Hypermetropia 09/26/2015   Presbyopia 09/26/2015   Regular astigmatism 09/26/2015   Hypermetropia of both eyes 09/26/2015   Senile nuclear sclerosis 09/26/2015   Stationary peripheral pterygium of right  eye 09/26/2015   Unspecified esotropia 09/26/2015   Ventral hernia without obstruction or gangrene 07/30/2015   Dyslipidemia 05/09/2015   OSA (obstructive sleep apnea) 05/09/2015   S/P CABG (coronary artery bypass graft) 05/09/2015   Unspecified atrial flutter (HCC) 05/09/2015    PCP: Jayne Mews, MD   REFERRING PROVIDER: Jayne Mews, MD   REFERRING DIAG:  R26.81 (ICD-10-CM) - Unsteadiness on feet R26.89 (ICD-10-CM) - Other abnormalities of gait and mobility  THERAPY DIAG:  Unsteadiness on feet  Ataxic gait  Muscle weakness (generalized)  RATIONALE FOR EVALUATION AND TREATMENT: Rehabilitation  ONSET DATE: Chronic  NEXT MD VISIT: 12/16/23   SUBJECTIVE:  SUBJECTIVE STATEMENT: Pt denies any recent issues at home with mobility or exercises.  Eval: Pt reports how he feels depends on how much sleep he gets.  Arrives to PT with RW but states he also has a 4WW and hiking poles which he uses intermixed. His wife feels like he has been more unsteady of late.  He notes some R knee pain a few months back but now resolved.  Works with Systems analyst 2x/wk - STS, dumbbells, planks, etc.  Sees a chiropractor for his back 1x/month.  Pt accompanied by: self  PAIN: Are you having pain? No  PERTINENT HISTORY:  Chronic spinal cerebellar ataxia, neuropathy, osteoporosis, R sided pelvic fracture s/p fall 05/2020, MI/CAD s/p CABG x 4, Aflutter, GERD, B shoulder surgery/RCR, L carpal tunnel release, OSA  PRECAUTIONS: Fall  RED FLAGS: None  WEIGHT BEARING RESTRICTIONS: No  FALLS:  Has patient fallen in last 6 months? No  LIVING ENVIRONMENT: Lives with: lives with their family Lives in: House/apartment Stairs: Yes: Internal: 14 steps; on right going up and on left going up and  External: 1 or 3 steps; on right going up and on left going up Has following equipment at home: Otho Blitz - 2 wheeled, Environmental consultant - 4 wheeled, shower chair, Grab bars, and hiking poles  OCCUPATION: Retired  PLOF: Independent, Independent with community mobility with device, and Leisure: community outings with friends, walking loops in driveway, driving with son (adult CP); works with Systems analyst 2x/wk - STS, dumbbells, planks, etc   PATIENT GOALS: To feel more confident walking with the walking sticks.   OBJECTIVE: (objective measures completed at initial evaluation unless otherwise dated)  DIAGNOSTIC FINDINGS:  06/22/23 - XR L toes (Left great toe skin breakdown) IMPRESSION:  Mild soft tissue swelling. No radiographic evidence of osteomyelitis.  Sonogram negative for DVT  COGNITION: Overall cognitive status: Within functional limits for tasks assessed   SENSATION: B LE neuropathy - predominantly in B feet  COORDINATION: Gross motor mild impairment B LE  EDEMA:  Intermittent distal LE edema  POSTURE:  No Significant postural limitations  MUSCLE LENGTH: Hamstrings: severe tight B ITB: mod tight L>R Piriformis: mod/severe tight B Hip flexors: mod tight B Quads: mild tight B Heelcord:   LOWER EXTREMITY ROM:    Grossly WFL other than restrictions related to above muscle tightness  LOWER EXTREMITY MMT:    MMT Right eval Left eval R 08/10/23 L 08/10/23 R 09/09/23 L 09/09/23  Hip flexion 4+ 4 5 5 5 5   Hip extension 4 4 4+ 4+ 4+ 4+  Hip abduction 4 4 4 4  4+ 4+  Hip adduction 4+ 4+ 4+ 5 4+ 4+  Hip internal rotation 4+ 4+ 5 5 5 5   Hip external rotation 4 4 4+ 4+ 5 4+  Knee flexion 5 5 5 5 5  4+  Knee extension 5 5 5 5 5 5   Ankle dorsiflexion 4+ 4 5 4+    Ankle plantarflexion        Ankle inversion        Ankle eversion        (Blank rows = not tested)  BED MOBILITY:  Independent  TRANSFERS: Assistive device utilized: Environmental consultant - 2 wheeled  Sit to stand: SBA Stand to  sit: SBA Chair to chair:   Floor:    GAIT: Distance walked: clinic distances Assistive device utilized: Environmental consultant - 2 wheeled Level of assistance: Modified independence and SBA Gait pattern: step to pattern, decreased hip/knee flexion- Right, decreased hip/knee flexion- Left,  ataxic, and trunk flexed Comments:   RAMP: Level of Assistance: NT Assistive device utilized:   Ramp Comments:   CURB: (09/16/23) Level of Assistance: SBA Assistive device utilized: Environmental consultant - 2 wheeled Curb Comments:   STAIRS:  Level of Assistance: NT  Stair Negotiation Technique:   Number of Stairs:    Height of Stairs:   Comments:   FUNCTIONAL TESTS:  5 times sit to stand: 16.47 sec w/o UE assist Timed up and go (TUG): 20.63 sec with RW 10 meter walk test: 32.68 sec with RW; 1.00 ft/sec with RW  Berg Balance Scale: 26/56; Scores of <36 indicate high risk for falls (close to 100%) - 07/19/23 Dynamic Gait Index: 10/24; Scores of 19 or less are predictive of falls in older community living adults - 07/19/23 MCTSIB: Condition 1: Avg of 3 trials: 30 sec, Condition 2: Avg of 3 trials: 8.67 sec, Condition 3: Avg of 3 trials: 30 sec, Condition 4: Avg of 3 trials: 4 sec, and Total Score: 72.67/120  08/10/23: 5xSTS = 14.09 sec  08/30/23: TUG = 13.0 sec, >13.5 sec is predictive of falls = 12.16 sec Gait speed = 2.70 ft/sec with RW; >2.62 ft/sec indicates community ambulator Berg = 32/56, <36 = High risk for falls (close to 100%)  DGI = 13/24, scores of 19 or less are predictive of falls in older community living adults  09/30/23:  Randye Buttner = 35/56, <36 = High risk for falls (close to 100%)   PATIENT SURVEYS:  ABC scale 360 / 1600 = 22.5 %; <50% indicates low level of physical functioning and risk for recurrent falls 08/30/23 - 08/30/23 - 570 / 1600 = 35.6 %   TODAY'S TREATMENT:   10/07/23 THERAPEUTIC EXERCISE: To improve strength and endurance.  Demonstration, verbal and tactile cues throughout for  technique. Rec Bike - L4 x 6 min  PHYSICAL PERFORMANCE TEST or MEASUREMENT: ABC scale: 1080 / 1600 = 67.5 %, 50-80% = moderate level of physical functioning, <67% indicates risk of falls with vestibular disorders or older adults DGI = 16/24, Scores of 19 or less are predictive of falls in older community living adults  Dynamic Gait Index  Level Surface Mild Impairment   Change in Gait Speed Mild Impairment   Gait with Horizontal Head Turns Mild Impairment   Gait with Vertical Head Turns Mild Impairment   Gait and Pivot Turn Mild Impairment   Step Over Obstacle Mild Impairment   Step Around Obstacles Mild Impairment   Steps Mild Impairment   Total Score 16   DGI comment: Scores of 19 or less are predictive of falls in older community living adults      GAIT TRAINING: To normalize gait pattern and prepare to wean to LRAD.  450' with B hiking poles on level surfaces indoors with CGA/SBA of PT via gait belt - better reciprocal sequencing of hiking poles today, however some increased lateral sway/instability noted when distracted by his phone butt dialing his wife; he still demonstrates tendency to increase speed in order to correct for instability/LOB  NEUROMUSCULAR RE-EDUCATION: To improve balance, coordination, kinesthesia, posture, proprioception, reduce fall risk, and amplitude of movement.  SLS + GTB 4-way SLR x 10 bil, 1-2 UE support on wall ladder and/or RW with CGA/SBA of PT via gait belt - cues to slow pace for improved control of spasticity/ataxia and decreased reliance on momentum/rebound of Thera-Band   10/04/23 THERAPEUTIC EXERCISE: To improve strength and endurance.  Demonstration, verbal and tactile cues throughout for technique.  Rec Bike - L4 x 6 min Standing hip kicks and up/over x 10  NEUROMUSCULAR RE-EDUCATION: To improve balance, coordination, kinesthesia, posture, proprioception, reduce fall risk, and amplitude of movement.  Sidesteps along counter 3x- intermittent UE  uspport Braiding along counter 3x- fingertips support B Fwd step and reaching x 10 Posterior toe tap and reach x 10  Standing GTB B scap retraction + shoulder row x 20, BOS slightly wider than hip width Standing GTB B scap retraction + shoulder extension x 20, BOS slightly wider than hip width Standing GTB B shoulder ext + circles alongside B hips x 10- very challenging   09/30/23 THERAPEUTIC EXERCISE: To improve strength and endurance.  Demonstration, verbal and tactile cues throughout for technique.  Rec Bike - L3 x 6 min  PHYSICAL PERFORMANCE TEST or MEASUREMENT: Berg Balance Test  Sit to Stand Able to stand without using hands and stabilize independently   Standing Unsupported Able to stand safely 2 minutes   Sitting with Back Unsupported but Feet Supported on Floor or Stool Able to sit safely and securely 2 minutes   Stand to Sit Sits safely with minimal use of hands   Transfers Able to transfer safely, definite need of hands   Standing Unsupported with Eyes Closed Able to stand 3 seconds   Standing Unsupported with Feet Together Needs help to attain position but able to stand for 30 seconds with feet together   From Standing, Reach Forward with Outstretched Arm Can reach forward >12 cm safely (5)   From Standing Position, Pick up Object from Floor Able to pick up shoe, needs supervision   From Standing Position, Turn to Look Behind Over each Shoulder Turn sideways only but maintains balance   Turn 360 Degrees Needs close supervision or verbal cueing   Standing Unsupported, Alternately Place Feet on Step/Stool Able to complete >2 steps/needs minimal assist   Standing Unsupported, One Foot in Front Able to take small step independently and hold 30 seconds   Standing on One Leg Tries to lift leg/unable to hold 3 seconds but remains standing independently   Total Score 35   Berg comment: <36 = High risk for falls (close to 100%)       GAIT TRAINING: To normalize gait pattern,  improve safety with hiking poles, and prepare to wean to LRAD.  400' with B hiking poles on level surfaces indoors with CGA/SBA of PT via gait belt - cues for placement of hiking pole more to front to improve upright alignment for better stability and avoid pole sliding out to rear   09/23/23 THERAPEUTIC EXERCISE: To improve strength and endurance.  Demonstration, verbal and tactile cues throughout for technique. Bike L3x52min Leg extension 30lb 2x15 BLE Leg curls 40lb 2x15 BLE  NEUROMUSCULAR RE-EDUCATION: To improve balance, coordination, kinesthesia, posture, proprioception, reduce fall risk, and amplitude of movement.  Quadruped rock fwd and back 2x10 Quadruped fire hydrants 2x10 B Quadruped hip extension x 10 B Standing UE support using orange pball:  Hip abduction x 15 B  Hip extension 2x10 B  Modified bird dog x5 with RLE/LUE; unable with reciprocal side Leg extension 30lb 2x15 BLE   09/23/23 THERAPEUTIC EXERCISE: To improve strength and endurance.  Demonstration, verbal and tactile cues throughout for technique. Bike L3x78min Leg curls 35lb 2x10 BLE Leg ext 25lb 2x10 BLE  NEUROMUSCULAR RE-EDUCATION: To improve balance, coordination, kinesthesia, posture, proprioception, reduce fall risk, and amplitude of movement.  TRX squats 2x10 TRX side lunges x 10 B TRX lateral  toe tap x 10 Standing hip flexion, abd, ext, add GTB x 10 R/L each   09/16/23 THERAPEUTIC EXERCISE: To improve strength and endurance.  Demonstration, verbal and tactile cues throughout for technique.  NuStep - L6 x 6 min  GAIT TRAINING: To normalize gait pattern, improve safety with RW, and prepare to wean to LRAD.  350' with B hiking poles on level surfaces indoors with CGA of PT via gait belt - cues for placement of hiking pole more to front to improve upright alignment for better stability and avoid pole sliding out to rear 600+' with RW over variable surfaces indoors and outdoors with SBA to occasional CGA  via gait belt - pt with tendency to reach more fwd with RW when lifting it over thresholds (elevator or doorway) and entrance rugs 200' with B hiking poles on slightly unlevel surfaces outdoors with CGA of PT via gait belt - continued cues for placement of hiking pole more to front to improve upright alignment for better stability and avoid pole sliding out to rear - pt tending to increase speed with fwd LOB requiring min A x 1 to prevent LOB  NEUROMUSCULAR RE-EDUCATION: To improve balance, coordination, kinesthesia, posture, proprioception, reduce fall risk, and amplitude of movement.  All activities performed with CGA/SBA of PT vis gait belt B Standing GTB pallof press x 10 with wide BOS, x 10 with standard BOS B Standing GTB trunk rotation x 10 with wide BOS Standing GTB B scap retraction + shoulder row x 15, BOS slightly wider than hip width Standing GTB B scap retraction + shoulder extension x 15, BOS slightly wider than hip width Standing GTB B shoulder flexion x 15, BOS slightly wider than hip width   09/13/23 THERAPEUTIC EXERCISE: To improve strength and endurance.  Demonstration, verbal and tactile cues throughout for technique. Bike L3x11min Seated PF black TB 2x10 B Standing calf raise x 10- staggered with L behind   NEUROMUSCULAR RE-EDUCATION: To improve proprioception, balance, and kinesthesia. TRX squats 2x10 TRX lunges each side x 10  Standing hip flexion (1HA), abduction (2HA) GTB 2x10 B Seated pallof press GTB x 10 B Seated trunk rotation GTB x 10 B   09/09/23 THERAPEUTIC EXERCISE: To improve strength and endurance.  Demonstration, verbal and tactile cues throughout for technique.  Nustep L6x68min UE/LE Le mmt Fwd step and reach to foam roll standing up Lateral step and reach to foam roll standing up Cone taps fwd, lateral and posterior Claf raises 2x6 Ankle pump with black TB x 10 B   PATIENT EDUCATION:  Education details: progress with PT, ongoing PT POC, and  continue with current HEP  Person educated: Patient Education method: Explanation Education comprehension: verbalized understanding  HOME EXERCISE PROGRAM: Access Code: [email or web address] URL: [email or web address] Date: 09/09/2023 Prepared by: Braylin Clark  Exercises - Seated Hamstring Stretch  - 1-2 x daily - 7 x weekly - 3 reps - 30 sec hold - Seated Table Piriformis Stretch  - 1-2 x daily - 7 x weekly - 3 reps - 30 sec hold - Seated Hip Flexor Stretch  - 1-2 x daily - 7 x weekly - 3 reps - 30 sec hold - Heel Raises with Counter Support  - 1 x daily - 7 x weekly - 3 sets - 10 reps - Standing Hip Abduction with Counter Support  - 1 x daily - 7 x weekly - 3 sets - 10 reps - Standing Hip Extension with Counter Support  - 1 x daily - 7  x weekly - 3 sets - 10 reps - Standing March with Counter Support  - 1 x daily - 7 x weekly - 3 sets - 10 reps - Seated Ankle Plantarflexion with Resistance  - 1 x daily - 7 x weekly - 2-3 sets - 10 reps  Patient Education - Check for Safety  Patient Education - Check for Safety  ASSESSMENT:  CLINICAL IMPRESSION: Jhonatan continues to demonstrate good progress with PT, with continued gains noted on standardized balance testing.  DGI improved to 16/24 today, up from 10/24 on eval - scores less than 19/24 indicate risk for recurrent falls in older community living adults, however given cerebellar ataxia and need for constant use of AD do not anticipate further increase in DGI score.  Recent assessment of Berg indicating score of 35/56, improved from 26/56 on evaluation, but still indicating a high (close to 100%) risk for falls.  ABC scale indicating improving balance confidence with score increased to 67.5% as compared to 22.5% on eval.  Rowland has demonstrated improvement in gait pattern and stability with use of B hiking poles for ambulation, however remains unsafe to attempt ambulation with hiking poles without supervision/SBA/CGA of PT - continue to  recommend full-time use of RW or 4WW for safe independent ambulation.  He continues to demonstrate progress towards his PT goals, with majority of goals met or partially met at this time.  Grantley will benefit from continued skilled PT to address ongoing strength, coordination and balance deficits to improve mobility and activity tolerance with decreased risk for falls to help reach maximal level of functional independence.  Anticipate he will be ready to transition to his HEP by end of current POC.   OBJECTIVE IMPAIRMENTS: Abnormal gait, decreased activity tolerance, decreased balance, decreased coordination, decreased endurance, decreased knowledge of use of DME, decreased mobility, difficulty walking, decreased ROM, decreased strength, decreased safety awareness, impaired perceived functional ability, impaired flexibility, impaired sensation, improper body mechanics, and postural dysfunction.   ACTIVITY LIMITATIONS: carrying, lifting, bending, standing, squatting, stairs, transfers, bathing, dressing, reach over head, locomotion level, and caring for others  PARTICIPATION LIMITATIONS: meal prep, cleaning, laundry, driving, shopping, community activity, and yard work  PERSONAL FACTORS: Age, Past/current experiences, Time since onset of injury/illness/exacerbation, and 3+ comorbidities: Chronic spinal cerebellar ataxia, neuropathy, osteoporosis, R sided pelvic fracture s/p fall 05/2020, MI/CAD s/p CABG x 4, Aflutter, GERD, B shoulder surgery/RCR, L carpal tunnel release, OSA are also affecting patient's functional outcome.   REHAB POTENTIAL: Good  CLINICAL DECISION MAKING: Unstable/unpredictable  EVALUATION COMPLEXITY: High   GOALS: Goals reviewed with patient? Yes  SHORT TERM GOALS: Target date: 08/09/2023  Patient will be independent with initial HEP to improve outcomes and carryover.  Baseline:  Goal status: MET - 08/05/23  2.  Patient will be educated on strategies to decrease risk of  falls.  Baseline:  Goal status: MET - 08/03/23 - Educated on Fall Prevention  3.  Patient will demonstrate decreased 5xSTS time to </= 14.8 sec to decrease risk for falls with transitional mobility. Baseline: 16.47 sec Goal status: MET - 08/10/23 - 14.09 sec  LONG TERM GOALS: Target date: 09/06/2023, extended to 10/18/2023   Patient will be independent with advanced/ongoing HEP to facilitate ability to maintain/progress functional gains from skilled physical therapy services. Baseline:  Goal status: IN PROGRESS - 10/07/23 - met for current HEP  2.  Patient will be able to ambulate 600' with LRAD on variable surfaces with good safety to access community.  Baseline:  Goal  status: IN PROGRESS - 09/16/23 - Able to ambulate 600+' with RW over variable surfaces indoors and outdoors with CGA/SBA of PT- pt with tendency to reach more fwd with RW when lifting it over thresholds (elevator or doorway) and entrance rugs or walking up inclines but no LOB  3.  Patient will be able to step up/down curb safely with LRAD for safety with community ambulation.  Baseline:  Goal status: IN PROGRESS - 09/16/23 - Able to ascend/descend curb with RW with SBA of PT   4.  Patient will demonstrate improved B LE strength to >/= 4+/5 for improved stability and ease of mobility . Baseline: Refer to above LE MMT table Goal status: MET - 09/09/23  5.  Patient will improve TUG time to </= 15 seconds with walking sticks or LRAD for improved efficiency and safety with transfers. Baseline: 20.63 sec with RW Goal status: MET - 08/30/23 - 13.0 sec with RW  6.  Patient will demonstrate gait speed of >/= 1.8 ft/sec (0.55 m/s) with walking sticks or LRAD to be a safe limited community ambulator with decreased risk for recurrent falls.  Baseline: 1.00 ft/sec Goal status: PARTIALLY MET - 08/30/23 - 2.70 ft/sec with RW  7.  Patient will improve Berg score by at least 8 points to improve safety and stability with ADLs in standing and  reduce risk for falls. (MCID= 8 points)  Baseline: 26/56 (07/19/23) 08/30/23 - 32/56 Goal status: MET - 09/30/23 - 35/56  8.  Patient will demonstrate at least 3 point improvement on DGI to improve gait stability and reduce risk for falls. Baseline: 10/24 (07/19/23) 08/30/23 - 13/24 - Goal met Goal status: MET - 10/07/23 - 16/24  9. Patient will report >/= 40% on ABC scale to demonstrate improved balance confidence with functional mobility and gait. Baseline: 360 / 1600 = 22.5 % 08/30/23 - 570 / 1600 = 35.6 % Goal status: MET - 10/07/23 - 1080 / 1600 = 67.5 %   PLAN:  PT FREQUENCY: 2x/week  PT DURATION: 6-7 weeks  PLANNED INTERVENTIONS: [id number]- PT Re-evaluation, 97110-Therapeutic exercises, 97530- Therapeutic activity, 97112- Neuromuscular re-education, 97535- Self Care, [id number]- Manual therapy, [phone]- Gait training, Patient/Family education, Balance training, Stair training, Taping, Dry Needling, DME instructions, Cryotherapy, and Moist heat  PLAN FOR NEXT SESSION: Review, update and/or consolidate HEP as indicated in preparation for anticipated upcoming transition to HEP at end of current POC; LE strengthening and static/dynamic balance stepping/weight shifting exercises; perturbations in standing; gait training with hiking poles   Francisco Irving, PT 10/07/2023, 3:09 PM

## 2023-10-11 ENCOUNTER — Ambulatory Visit: Admitting: Physical Therapy

## 2023-10-11 ENCOUNTER — Encounter: Payer: Self-pay | Admitting: Physical Therapy

## 2023-10-11 DIAGNOSIS — R2681 Unsteadiness on feet: Secondary | ICD-10-CM | POA: Diagnosis not present

## 2023-10-11 DIAGNOSIS — M6281 Muscle weakness (generalized): Secondary | ICD-10-CM

## 2023-10-11 DIAGNOSIS — R260 Ataxic gait: Secondary | ICD-10-CM

## 2023-10-11 NOTE — Therapy (Signed)
 OUTPATIENT PHYSICAL THERAPY TREATMENT   Patient Name: Andre [name] MRN: [id number] DOB:08/21/1940, 83 y.o., male Today's Date: 10/11/2023   END OF SESSION:  PT End of Session - 10/11/23 1402     Visit Number 20    Date for PT Re-Evaluation 10/18/23    Authorization Type [employer]    Progress Note Due on Visit 29   PN on visit #19 - 10/07/23   PT Start Time 1402    PT Stop Time 1449    PT Time Calculation (min) 47 min    Equipment Utilized During Treatment --    Activity Tolerance Patient tolerated treatment well    Behavior During Therapy WFL for tasks assessed/performed                  Past Medical History:  Diagnosis Date   Carpal tunnel syndrome    Cerebellar ataxia (HCC)    spinal cerebellar ataxia   Dyslipidemia    H/O rotator cuff surgery    Obstructive sleep apnea    Past Surgical History:  Procedure Laterality Date   BYPASS GRAFT AORTA TO AORTA     quadruple bypass   CARPAL TUNNEL RELEASE Left    EYE SURGERY Bilateral    SHOULDER SURGERY     bilateral   TONSILLECTOMY     VASECTOMY     Patient Active Problem List   Diagnosis Date Noted   Osteoporosis, senile 08/27/2022   Neuropathy 12/12/2018   Cerebellar ataxia (HCC) 12/12/2018   Vitreous membranes and strands 10/20/2018   Bilateral cold feet 04/07/2017   Osteopenia of multiple sites 04/07/2017   Vitamin D deficiency 04/07/2017   CAD in native artery 03/09/2017   Old MI (myocardial infarction) 03/09/2017   Carpal tunnel syndrome, 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   Ulnar neuropathy at elbow, 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   PVC's (premature ventricular contractions) 10/09/2015   Chronic dryness of both eyes 09/26/2015   Hypermetropia 09/26/2015   Presbyopia 09/26/2015   Regular astigmatism 09/26/2015   Hypermetropia of both eyes 09/26/2015   Senile nuclear sclerosis 09/26/2015   Stationary peripheral pterygium of right eye 09/26/2015   Unspecified esotropia 09/26/2015   Ventral hernia without obstruction  or gangrene 07/30/2015   Dyslipidemia 05/09/2015   OSA (obstructive sleep apnea) 05/09/2015   S/P CABG (coronary artery bypass graft) 05/09/2015   Unspecified atrial flutter (HCC) 05/09/2015    PCP: Jayne Mews, MD   REFERRING PROVIDER: Jayne Mews, MD   REFERRING DIAG:  R26.81 (ICD-10-CM) - Unsteadiness on feet R26.89 (ICD-10-CM) - Other abnormalities of gait and mobility  THERAPY DIAG:  Unsteadiness on feet  Ataxic gait  Muscle weakness (generalized)  RATIONALE FOR EVALUATION AND TREATMENT: Rehabilitation  ONSET DATE: Chronic  NEXT MD VISIT: 12/16/23   SUBJECTIVE:  SUBJECTIVE STATEMENT: Pt w/o complaints or concerns today.  Eval: Pt reports how he feels depends on how much sleep he gets.  Arrives to PT with RW but states he also has a 4WW and hiking poles which he uses intermixed. His wife feels like he has been more unsteady of late.  He notes some R knee pain a few months back but now resolved.  Works with Systems analyst 2x/wk - STS, dumbbells, planks, etc.  Sees a chiropractor for his back 1x/month.  Pt accompanied by: self  PAIN: Are you having pain? No  PERTINENT HISTORY:  Chronic spinal cerebellar ataxia, neuropathy, osteoporosis, R sided pelvic fracture s/p fall 05/2020, MI/CAD s/p CABG x 4, Aflutter, GERD, B shoulder surgery/RCR, L carpal tunnel release, OSA  PRECAUTIONS: Fall  RED FLAGS: None  WEIGHT BEARING RESTRICTIONS: No  FALLS:  Has patient fallen in last 6 months? No  LIVING ENVIRONMENT: Lives with: lives with their family Lives in: House/apartment Stairs: Yes: Internal: 14 steps; on right going up and on left going up and External: 1 or 3 steps; on right going up and on left going up Has following equipment at home: Otho Blitz - 2 wheeled,  Environmental consultant - 4 wheeled, shower chair, Grab bars, and hiking poles  OCCUPATION: Retired  PLOF: Independent, Independent with community mobility with device, and Leisure: community outings with friends, walking loops in driveway, driving with son (adult CP); works with Systems analyst 2x/wk - STS, dumbbells, planks, etc   PATIENT GOALS: To feel more confident walking with the walking sticks.   OBJECTIVE: (objective measures completed at initial evaluation unless otherwise dated)  DIAGNOSTIC FINDINGS:  06/22/23 - XR L toes (Left great toe skin breakdown) IMPRESSION:  Mild soft tissue swelling. No radiographic evidence of osteomyelitis.  Sonogram negative for DVT  COGNITION: Overall cognitive status: Within functional limits for tasks assessed   SENSATION: B LE neuropathy - predominantly in B feet  COORDINATION: Gross motor mild impairment B LE  EDEMA:  Intermittent distal LE edema  POSTURE:  No Significant postural limitations  MUSCLE LENGTH: Hamstrings: severe tight B ITB: mod tight L>R Piriformis: mod/severe tight B Hip flexors: mod tight B Quads: mild tight B Heelcord:   LOWER EXTREMITY ROM:    Grossly WFL other than restrictions related to above muscle tightness  LOWER EXTREMITY MMT:    MMT Right eval Left eval R 08/10/23 L 08/10/23 R 09/09/23 L 09/09/23  Hip flexion 4+ 4 5 5 5 5   Hip extension 4 4 4+ 4+ 4+ 4+  Hip abduction 4 4 4 4  4+ 4+  Hip adduction 4+ 4+ 4+ 5 4+ 4+  Hip internal rotation 4+ 4+ 5 5 5 5   Hip external rotation 4 4 4+ 4+ 5 4+  Knee flexion 5 5 5 5 5  4+  Knee extension 5 5 5 5 5 5   Ankle dorsiflexion 4+ 4 5 4+    Ankle plantarflexion        Ankle inversion        Ankle eversion        (Blank rows = not tested)  BED MOBILITY:  Independent  TRANSFERS: Assistive device utilized: Environmental consultant - 2 wheeled  Sit to stand: SBA Stand to sit: SBA Chair to chair:   Floor:    GAIT: Distance walked: clinic distances Assistive device utilized: Environmental consultant - 2  wheeled Level of assistance: Modified independence and SBA Gait pattern: step to pattern, decreased hip/knee flexion- Right, decreased hip/knee flexion- Left, ataxic, and trunk flexed Comments:  RAMP: Level of Assistance: NT Assistive device utilized:   Ramp Comments:   CURB: (09/16/23) Level of Assistance: SBA Assistive device utilized: Environmental consultant - 2 wheeled Curb Comments:   STAIRS:  Level of Assistance: NT  Stair Negotiation Technique:   Number of Stairs:    Height of Stairs:   Comments:   FUNCTIONAL TESTS:  5 times sit to stand: 16.47 sec w/o UE assist Timed up and go (TUG): 20.63 sec with RW 10 meter walk test: 32.68 sec with RW; 1.00 ft/sec with RW  Berg Balance Scale: 26/56; Scores of <36 indicate high risk for falls (close to 100%) - 07/19/23 Dynamic Gait Index: 10/24; Scores of 19 or less are predictive of falls in older community living adults - 07/19/23 MCTSIB: Condition 1: Avg of 3 trials: 30 sec, Condition 2: Avg of 3 trials: 8.67 sec, Condition 3: Avg of 3 trials: 30 sec, Condition 4: Avg of 3 trials: 4 sec, and Total Score: 72.67/120  08/10/23: 5xSTS = 14.09 sec  08/30/23: TUG = 13.0 sec, >13.5 sec is predictive of falls = 12.16 sec Gait speed = 2.70 ft/sec with RW; >2.62 ft/sec indicates community ambulator Berg = 32/56, <36 = High risk for falls (close to 100%)  DGI = 13/24, scores of 19 or less are predictive of falls in older community living adults  09/30/23:  Randye Buttner = 35/56, <36 = High risk for falls (close to 100%)   PATIENT SURVEYS:  ABC scale 360 / 1600 = 22.5 %; <50% indicates low level of physical functioning and risk for recurrent falls 08/30/23 - 08/30/23 - 570 / 1600 = 35.6 %   TODAY'S TREATMENT:   10/11/23 THERAPEUTIC EXERCISE: To improve strength and endurance.  Demonstration, verbal and tactile cues throughout for technique.  NuStep - L6 x 6 min - UE/LE  NEUROMUSCULAR RE-EDUCATION: To improve balance, coordination, kinesthesia, posture,  proprioception, and reduce fall risk. Standing 7-way clock reaches with toe tap while holding onto wall ladder x 5 bil Standing alternating pivot step and reach while holding onto wall ladder x 10 bil B heel-raises while holding onto wall ladder x 10 bil - fatigued after ~8 reps B toe-raises while holding onto wall ladder x 10 bil - fatigued after ~8 reps   10/07/23 THERAPEUTIC EXERCISE: To improve strength and endurance.  Demonstration, verbal and tactile cues throughout for technique. Rec Bike - L4 x 6 min  PHYSICAL PERFORMANCE TEST or MEASUREMENT: ABC scale: 1080 / 1600 = 67.5 %, 50-80% = moderate level of physical functioning, <67% indicates risk of falls with vestibular disorders or older adults DGI = 16/24, Scores of 19 or less are predictive of falls in older community living adults  Dynamic Gait Index  Level Surface Mild Impairment   Change in Gait Speed Mild Impairment   Gait with Horizontal Head Turns Mild Impairment   Gait with Vertical Head Turns Mild Impairment   Gait and Pivot Turn Mild Impairment   Step Over Obstacle Mild Impairment   Step Around Obstacles Mild Impairment   Steps Mild Impairment   Total Score 16   DGI comment: Scores of 19 or less are predictive of falls in older community living adults       GAIT TRAINING: To normalize gait pattern and prepare to wean to LRAD.  450' with B hiking poles on level surfaces indoors with CGA/SBA of PT via gait belt - better reciprocal sequencing of hiking poles today, however some increased lateral sway/instability noted when distracted by his phone  butt dialing his wife; he still demonstrates tendency to increase speed in order to correct for instability/LOB  NEUROMUSCULAR RE-EDUCATION: To improve balance, coordination, kinesthesia, posture, proprioception, reduce fall risk, and amplitude of movement.  SLS + GTB 4-way SLR x 10 bil, 1-2 UE support on wall ladder and/or RW with CGA/SBA of PT via gait belt - cues to slow pace  for improved control of spasticity/ataxia and decreased reliance on momentum/rebound of Thera-Band   10/04/23 THERAPEUTIC EXERCISE: To improve strength and endurance.  Demonstration, verbal and tactile cues throughout for technique.  Rec Bike - L4 x 6 min Standing hip kicks and up/over x 10  NEUROMUSCULAR RE-EDUCATION: To improve balance, coordination, kinesthesia, posture, proprioception, reduce fall risk, and amplitude of movement.  Sidesteps along counter 3x- intermittent UE uspport Braiding along counter 3x- fingertips support B Fwd step and reaching x 10 Posterior toe tap and reach x 10  Standing GTB B scap retraction + shoulder row x 20, BOS slightly wider than hip width Standing GTB B scap retraction + shoulder extension x 20, BOS slightly wider than hip width Standing GTB B shoulder ext + circles alongside B hips x 10- very challenging   09/30/23 THERAPEUTIC EXERCISE: To improve strength and endurance.  Demonstration, verbal and tactile cues throughout for technique.  Rec Bike - L3 x 6 min  PHYSICAL PERFORMANCE TEST or MEASUREMENT: Berg Balance Test  Sit to Stand Able to stand without using hands and stabilize independently   Standing Unsupported Able to stand safely 2 minutes   Sitting with Back Unsupported but Feet Supported on Floor or Stool Able to sit safely and securely 2 minutes   Stand to Sit Sits safely with minimal use of hands   Transfers Able to transfer safely, definite need of hands   Standing Unsupported with Eyes Closed Able to stand 3 seconds   Standing Unsupported with Feet Together Needs help to attain position but able to stand for 30 seconds with feet together   From Standing, Reach Forward with Outstretched Arm Can reach forward >12 cm safely (5)   From Standing Position, Pick up Object from Floor Able to pick up shoe, needs supervision   From Standing Position, Turn to Look Behind Over each Shoulder Turn sideways only but maintains balance   Turn 360  Degrees Needs close supervision or verbal cueing   Standing Unsupported, Alternately Place Feet on Step/Stool Able to complete >2 steps/needs minimal assist   Standing Unsupported, One Foot in Front Able to take small step independently and hold 30 seconds   Standing on One Leg Tries to lift leg/unable to hold 3 seconds but remains standing independently   Total Score 35   Berg comment: <36 = High risk for falls (close to 100%)       GAIT TRAINING: To normalize gait pattern, improve safety with hiking poles, and prepare to wean to LRAD.  400' with B hiking poles on level surfaces indoors with CGA/SBA of PT via gait belt - cues for placement of hiking pole more to front to improve upright alignment for better stability and avoid pole sliding out to rear   09/27/23 THERAPEUTIC EXERCISE: To improve strength and endurance.  Demonstration, verbal and tactile cues throughout for technique. Bike L3x23min Leg extension 30lb 2x15 BLE Leg curls 40lb 2x15 BLE  NEUROMUSCULAR RE-EDUCATION: To improve balance, coordination, kinesthesia, posture, proprioception, reduce fall risk, and amplitude of movement.  Quadruped rock fwd and back 2x10 Quadruped fire hydrants 2x10 B Quadruped hip extension x  10 B Standing UE support using orange pball:  Hip abduction x 15 B  Hip extension 2x10 B  Modified bird dog x5 with RLE/LUE; unable with reciprocal side Leg extension 30lb 2x15 BLE   PATIENT EDUCATION:  Education details: HEP review and HEP update  Person educated: Patient Education method: Explanation, Demonstration, Verbal cues, Handouts, and MedBridgeGO app access provided Education comprehension: verbalized understanding, returned demonstration, and verbal cues required  HOME EXERCISE PROGRAM: Access Code: [email or web address] URL: [email or web address] Date: 10/11/2023 Prepared by: Felecia Hopper  Exercises - Seated Hamstring Stretch  - 1-2 x daily - 7 x weekly - 3 reps - 30 sec hold - Seated  Table Piriformis Stretch  - 1-2 x daily - 7 x weekly - 3 reps - 30 sec hold - Seated Hip Flexor Stretch  - 1-2 x daily - 7 x weekly - 3 reps - 30 sec hold - Heel Raises with Counter Support  - 1 x daily - 7 x weekly - 3 sets - 10 reps - Standing Hip Abduction with Counter Support  - 1 x daily - 7 x weekly - 3 sets - 10 reps - Standing Hip Extension with Counter Support  - 1 x daily - 7 x weekly - 3 sets - 10 reps - Standing March with Counter Support  - 1 x daily - 7 x weekly - 3 sets - 10 reps - Seated Ankle Plantarflexion with Resistance  - 1 x daily - 7 x weekly - 2-3 sets - 10 reps - Single Leg Balance with Clock Reach  - 1 x daily - 7 x weekly - 2 sets - 10 reps - 3 sec hold  Patient Education - Check for Safety   ASSESSMENT:  CLINICAL IMPRESSION: Initiated verbal review of HEP with patient denying any concerns or need for formal practice of current exercises.  Discussed options for safe performance of balance related activities at home including corner balance and or use of kitchen counter support at edge of sink for dynamic stepping activities and SLS balance activities, simulating these activities using wall ladder for UE support during therapy session today.  In addition to providing handout for updated HEP balance activity, also provided access to MedBridge[employer] app to allow for video instruction as needed at home.  Will continue to review and finalize HEP over remaining visits with anticipated transition to HEP at end of current POC.   OBJECTIVE IMPAIRMENTS: Abnormal gait, decreased activity tolerance, decreased balance, decreased coordination, decreased endurance, decreased knowledge of use of DME, decreased mobility, difficulty walking, decreased ROM, decreased strength, decreased safety awareness, impaired perceived functional ability, impaired flexibility, impaired sensation, improper body mechanics, and postural dysfunction.   ACTIVITY LIMITATIONS: carrying, lifting, bending,  standing, squatting, stairs, transfers, bathing, dressing, reach over head, locomotion level, and caring for others  PARTICIPATION LIMITATIONS: meal prep, cleaning, laundry, driving, shopping, community activity, and yard work  PERSONAL FACTORS: Age, Past/current experiences, Time since onset of injury/illness/exacerbation, and 3+ comorbidities: Chronic spinal cerebellar ataxia, neuropathy, osteoporosis, R sided pelvic fracture s/p fall 05/2020, MI/CAD s/p CABG x 4, Aflutter, GERD, B shoulder surgery/RCR, L carpal tunnel release, OSA are also affecting patient's functional outcome.   REHAB POTENTIAL: Good  CLINICAL DECISION MAKING: Unstable/unpredictable  EVALUATION COMPLEXITY: High   GOALS: Goals reviewed with patient? Yes  SHORT TERM GOALS: Target date: 08/09/2023  Patient will be independent with initial HEP to improve outcomes and carryover.  Baseline:  Goal status: MET - 08/05/23  2.  Patient will be educated on  strategies to decrease risk of falls.  Baseline:  Goal status: MET - 08/03/23 - Educated on Fall Prevention  3.  Patient will demonstrate decreased 5xSTS time to </= 14.8 sec to decrease risk for falls with transitional mobility. Baseline: 16.47 sec Goal status: MET - 08/10/23 - 14.09 sec  LONG TERM GOALS: Target date: 09/06/2023, extended to 10/18/2023   Patient will be independent with advanced/ongoing HEP to facilitate ability to maintain/progress functional gains from skilled physical therapy services. Baseline:  Goal status: IN PROGRESS - 10/07/23 - met for current HEP  2.  Patient will be able to ambulate 600' with LRAD on variable surfaces with good safety to access community.  Baseline:  Goal status: IN PROGRESS - 09/16/23 - Able to ambulate 600+' with RW over variable surfaces indoors and outdoors with CGA/SBA of PT- pt with tendency to reach more fwd with RW when lifting it over thresholds (elevator or doorway) and entrance rugs or walking up inclines but no  LOB  3.  Patient will be able to step up/down curb safely with LRAD for safety with community ambulation.  Baseline:  Goal status: IN PROGRESS - 09/16/23 - Able to ascend/descend curb with RW with SBA of PT   4.  Patient will demonstrate improved B LE strength to >/= 4+/5 for improved stability and ease of mobility . Baseline: Refer to above LE MMT table Goal status: MET - 09/09/23  5.  Patient will improve TUG time to </= 15 seconds with walking sticks or LRAD for improved efficiency and safety with transfers. Baseline: 20.63 sec with RW Goal status: MET - 08/30/23 - 13.0 sec with RW  6.  Patient will demonstrate gait speed of >/= 1.8 ft/sec (0.55 m/s) with walking sticks or LRAD to be a safe limited community ambulator with decreased risk for recurrent falls.  Baseline: 1.00 ft/sec Goal status: PARTIALLY MET - 08/30/23 - 2.70 ft/sec with RW  7.  Patient will improve Berg score by at least 8 points to improve safety and stability with ADLs in standing and reduce risk for falls. (MCID= 8 points)  Baseline: 26/56 (07/19/23) 08/30/23 - 32/56 Goal status: MET - 09/30/23 - 35/56  8.  Patient will demonstrate at least 3 point improvement on DGI to improve gait stability and reduce risk for falls. Baseline: 10/24 (07/19/23) 08/30/23 - 13/24 - Goal met Goal status: MET - 10/07/23 - 16/24  9. Patient will report >/= 40% on ABC scale to demonstrate improved balance confidence with functional mobility and gait. Baseline: 360 / 1600 = 22.5 % 08/30/23 - 570 / 1600 = 35.6 % Goal status: MET - 10/07/23 - 1080 / 1600 = 67.5 %   PLAN:  PT FREQUENCY: 2x/week  PT DURATION: 6-7 weeks  PLANNED INTERVENTIONS: [id number]- PT Re-evaluation, 97110-Therapeutic exercises, 97530- Therapeutic activity, 97112- Neuromuscular re-education, 97535- Self Care, [id number]- Manual therapy, [phone]- Gait training, Patient/Family education, Balance training, Stair training, Taping, Dry Needling, DME instructions, Cryotherapy, and Moist  heat  PLAN FOR NEXT SESSION: Review, update and/or consolidate HEP as indicated in preparation for anticipated upcoming transition to HEP at end of current POC; LE strengthening and static/dynamic balance stepping/weight shifting exercises; perturbations in standing; gait training with hiking poles   Francisco Irving, PT 10/11/2023, 8:22 PM

## 2023-10-13 ENCOUNTER — Encounter: Payer: Self-pay | Admitting: Endocrinology

## 2023-10-14 ENCOUNTER — Ambulatory Visit

## 2023-10-14 DIAGNOSIS — M6281 Muscle weakness (generalized): Secondary | ICD-10-CM

## 2023-10-14 DIAGNOSIS — R2681 Unsteadiness on feet: Secondary | ICD-10-CM

## 2023-10-14 DIAGNOSIS — R260 Ataxic gait: Secondary | ICD-10-CM

## 2023-10-14 NOTE — Therapy (Signed)
 OUTPATIENT PHYSICAL THERAPY TREATMENT   Patient Name: Andre [name] MRN: [id number] DOB:1941-03-14, 83 y.o., male Today's Date: 10/14/2023   END OF SESSION:  PT End of Session - 10/14/23 1448     Visit Number 21    Date for PT Re-Evaluation 10/18/23    Authorization Type [employer]    Progress Note Due on Visit 29   PN on visit #19 - 10/07/23   PT Start Time 1403    PT Stop Time 1444    PT Time Calculation (min) 41 min    Activity Tolerance Patient tolerated treatment well    Behavior During Therapy [facility] for tasks assessed/performed                   Past Medical History:  Diagnosis Date   Carpal tunnel syndrome    Cerebellar ataxia (HCC)    spinal cerebellar ataxia   Dyslipidemia    H/O rotator cuff surgery    Obstructive sleep apnea    Past Surgical History:  Procedure Laterality Date   BYPASS GRAFT AORTA TO AORTA     quadruple bypass   CARPAL TUNNEL RELEASE Left    EYE SURGERY Bilateral    SHOULDER SURGERY     bilateral   TONSILLECTOMY     VASECTOMY     Patient Active Problem List   Diagnosis Date Noted   Osteoporosis, senile 08/27/2022   Neuropathy 12/12/2018   Cerebellar ataxia (HCC) 12/12/2018   Vitreous membranes and strands 10/20/2018   Bilateral cold feet 04/07/2017   Osteopenia of multiple sites 04/07/2017   Vitamin D deficiency 04/07/2017   CAD in native artery 03/09/2017   Old MI (myocardial infarction) 03/09/2017   Carpal tunnel syndrome, 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   Ulnar neuropathy at elbow, 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   PVC's (premature ventricular contractions) 10/09/2015   Chronic dryness of both eyes 09/26/2015   Hypermetropia 09/26/2015   Presbyopia 09/26/2015   Regular astigmatism 09/26/2015   Hypermetropia of both eyes 09/26/2015   Senile nuclear sclerosis 09/26/2015   Stationary peripheral pterygium of right eye 09/26/2015   Unspecified esotropia 09/26/2015   Ventral hernia without obstruction or gangrene 07/30/2015   Dyslipidemia  05/09/2015   OSA (obstructive sleep apnea) 05/09/2015   S/P CABG (coronary artery bypass graft) 05/09/2015   Unspecified atrial flutter (HCC) 05/09/2015    PCP: Jayne Mews, MD   REFERRING PROVIDER: Jayne Mews, MD   REFERRING DIAG:  R26.81 (ICD-10-CM) - Unsteadiness on feet R26.89 (ICD-10-CM) - Other abnormalities of gait and mobility  THERAPY DIAG:  Unsteadiness on feet  Ataxic gait  Muscle weakness (generalized)  RATIONALE FOR EVALUATION AND TREATMENT: Rehabilitation  ONSET DATE: Chronic  NEXT MD VISIT: 12/16/23   SUBJECTIVE:  SUBJECTIVE STATEMENT: No new complaints  Eval: Pt reports how he feels depends on how much sleep he gets.  Arrives to PT with RW but states he also has a 4WW and hiking poles which he uses intermixed. His wife feels like he has been more unsteady of late.  He notes some R knee pain a few months back but now resolved.  Works with Systems analyst 2x/wk - STS, dumbbells, planks, etc.  Sees a chiropractor for his back 1x/month.  Pt accompanied by: self  PAIN: Are you having pain? No  PERTINENT HISTORY:  Chronic spinal cerebellar ataxia, neuropathy, osteoporosis, R sided pelvic fracture s/p fall 05/2020, MI/CAD s/p CABG x 4, Aflutter, GERD, B shoulder surgery/RCR, L carpal tunnel release, OSA  PRECAUTIONS: Fall  RED FLAGS: None  WEIGHT BEARING RESTRICTIONS: No  FALLS:  Has patient fallen in last 6 months? No  LIVING ENVIRONMENT: Lives with: lives with their family Lives in: House/apartment Stairs: Yes: Internal: 14 steps; on right going up and on left going up and External: 1 or 3 steps; on right going up and on left going up Has following equipment at home: Otho Blitz - 2 wheeled, Environmental consultant - 4 wheeled, shower chair, Grab bars, and hiking  poles  OCCUPATION: Retired  PLOF: Independent, Independent with community mobility with device, and Leisure: community outings with friends, walking loops in driveway, driving with son (adult CP); works with Systems analyst 2x/wk - STS, dumbbells, planks, etc   PATIENT GOALS: To feel more confident walking with the walking sticks.   OBJECTIVE: (objective measures completed at initial evaluation unless otherwise dated)  DIAGNOSTIC FINDINGS:  06/22/23 - XR L toes (Left great toe skin breakdown) IMPRESSION:  Mild soft tissue swelling. No radiographic evidence of osteomyelitis.  Sonogram negative for DVT  COGNITION: Overall cognitive status: Within functional limits for tasks assessed   SENSATION: B LE neuropathy - predominantly in B feet  COORDINATION: Gross motor mild impairment B LE  EDEMA:  Intermittent distal LE edema  POSTURE:  No Significant postural limitations  MUSCLE LENGTH: Hamstrings: severe tight B ITB: mod tight L>R Piriformis: mod/severe tight B Hip flexors: mod tight B Quads: mild tight B Heelcord:   LOWER EXTREMITY ROM:    Grossly WFL other than restrictions related to above muscle tightness  LOWER EXTREMITY MMT:    MMT Right eval Left eval R 08/10/23 L 08/10/23 R 09/09/23 L 09/09/23  Hip flexion 4+ 4 5 5 5 5   Hip extension 4 4 4+ 4+ 4+ 4+  Hip abduction 4 4 4 4  4+ 4+  Hip adduction 4+ 4+ 4+ 5 4+ 4+  Hip internal rotation 4+ 4+ 5 5 5 5   Hip external rotation 4 4 4+ 4+ 5 4+  Knee flexion 5 5 5 5 5  4+  Knee extension 5 5 5 5 5 5   Ankle dorsiflexion 4+ 4 5 4+    Ankle plantarflexion        Ankle inversion        Ankle eversion        (Blank rows = not tested)  BED MOBILITY:  Independent  TRANSFERS: Assistive device utilized: Environmental consultant - 2 wheeled  Sit to stand: SBA Stand to sit: SBA Chair to chair:   Floor:    GAIT: Distance walked: clinic distances Assistive device utilized: Environmental consultant - 2 wheeled Level of assistance: Modified independence and  SBA Gait pattern: step to pattern, decreased hip/knee flexion- Right, decreased hip/knee flexion- Left, ataxic, and trunk flexed Comments:   RAMP:  Level of Assistance: NT Assistive device utilized:   Ramp Comments:   CURB: (09/16/23) Level of Assistance: SBA Assistive device utilized: Environmental consultant - 2 wheeled Curb Comments:   STAIRS:  Level of Assistance: NT  Stair Negotiation Technique:   Number of Stairs:    Height of Stairs:   Comments:   FUNCTIONAL TESTS:  5 times sit to stand: 16.47 sec w/o UE assist Timed up and go (TUG): 20.63 sec with RW 10 meter walk test: 32.68 sec with RW; 1.00 ft/sec with RW  Berg Balance Scale: 26/56; Scores of <36 indicate high risk for falls (close to 100%) - 07/19/23 Dynamic Gait Index: 10/24; Scores of 19 or less are predictive of falls in older community living adults - 07/19/23 MCTSIB: Condition 1: Avg of 3 trials: 30 sec, Condition 2: Avg of 3 trials: 8.67 sec, Condition 3: Avg of 3 trials: 30 sec, Condition 4: Avg of 3 trials: 4 sec, and Total Score: 72.67/120  08/10/23: 5xSTS = 14.09 sec  08/30/23: TUG = 13.0 sec, >13.5 sec is predictive of falls = 12.16 sec Gait speed = 2.70 ft/sec with RW; >2.62 ft/sec indicates community ambulator Berg = 32/56, <36 = High risk for falls (close to 100%)  DGI = 13/24, scores of 19 or less are predictive of falls in older community living adults  09/30/23:  Randye Buttner = 35/56, <36 = High risk for falls (close to 100%)   PATIENT SURVEYS:  ABC scale 360 / 1600 = 22.5 %; <50% indicates low level of physical functioning and risk for recurrent falls 08/30/23 - 08/30/23 - 570 / 1600 = 35.6 %   TODAY'S TREATMENT:  10/14/23 THERAPEUTIC EXERCISE: To improve strength and endurance.  Demonstration, verbal and tactile cues throughout for technique. Bike L4x73min Trunk rotation w/  NEUROMUSCULAR RE-EDUCATION: To improve balance, coordination, kinesthesia, posture, proprioception, and reduce fall risk. Standing 7-way clock  reaches with toe tap while holding onto wall ladder x 5 bil Standing fwd step then lunge 2x10 B Standing lateral step the lunge 2x10 B Standing alternating pivot step and reach while holding onto wall ladder x 10 bil B heel-raises while holding onto wall ladder 2 x 10 bil - fatigued after ~8 reps B toe-raises while holding onto wall ladder 2 x 10 bil - fatigued after ~8 reps Bridging straight leg orange pball 2x10  10/11/23 THERAPEUTIC EXERCISE: To improve strength and endurance.  Demonstration, verbal and tactile cues throughout for technique.  NuStep - L6 x 6 min - UE/LE  NEUROMUSCULAR RE-EDUCATION: To improve balance, coordination, kinesthesia, posture, proprioception, and reduce fall risk. Standing 7-way clock reaches with toe tap while holding onto wall ladder x 5 bil Standing alternating pivot step and reach while holding onto wall ladder x 10 bil B heel-raises while holding onto wall ladder x 10 bil - fatigued after ~8 reps B toe-raises while holding onto wall ladder x 10 bil - fatigued after ~8 reps   10/07/23 THERAPEUTIC EXERCISE: To improve strength and endurance.  Demonstration, verbal and tactile cues throughout for technique. Rec Bike - L4 x 6 min  PHYSICAL PERFORMANCE TEST or MEASUREMENT: ABC scale: 1080 / 1600 = 67.5 %, 50-80% = moderate level of physical functioning, <67% indicates risk of falls with vestibular disorders or older adults DGI = 16/24, Scores of 19 or less are predictive of falls in older community living adults  Dynamic Gait Index  Level Surface Mild Impairment   Change in Gait Speed Mild Impairment   Gait with Horizontal Head  Turns Mild Impairment   Gait with Vertical Head Turns Mild Impairment   Gait and Pivot Turn Mild Impairment   Step Over Obstacle Mild Impairment   Step Around Obstacles Mild Impairment   Steps Mild Impairment   Total Score 16   DGI comment: Scores of 19 or less are predictive of falls in older community living adults        GAIT TRAINING: To normalize gait pattern and prepare to wean to LRAD.  450' with B hiking poles on level surfaces indoors with CGA/SBA of PT via gait belt - better reciprocal sequencing of hiking poles today, however some increased lateral sway/instability noted when distracted by his phone butt dialing his wife; he still demonstrates tendency to increase speed in order to correct for instability/LOB  NEUROMUSCULAR RE-EDUCATION: To improve balance, coordination, kinesthesia, posture, proprioception, reduce fall risk, and amplitude of movement.  SLS + GTB 4-way SLR x 10 bil, 1-2 UE support on wall ladder and/or RW with CGA/SBA of PT via gait belt - cues to slow pace for improved control of spasticity/ataxia and decreased reliance on momentum/rebound of Thera-Band   10/04/23 THERAPEUTIC EXERCISE: To improve strength and endurance.  Demonstration, verbal and tactile cues throughout for technique.  Rec Bike - L4 x 6 min Standing hip kicks and up/over x 10  NEUROMUSCULAR RE-EDUCATION: To improve balance, coordination, kinesthesia, posture, proprioception, reduce fall risk, and amplitude of movement.  Sidesteps along counter 3x- intermittent UE uspport Braiding along counter 3x- fingertips support B Fwd step and reaching x 10 Posterior toe tap and reach x 10  Standing GTB B scap retraction + shoulder row x 20, BOS slightly wider than hip width Standing GTB B scap retraction + shoulder extension x 20, BOS slightly wider than hip width Standing GTB B shoulder ext + circles alongside B hips x 10- very challenging   09/30/23 THERAPEUTIC EXERCISE: To improve strength and endurance.  Demonstration, verbal and tactile cues throughout for technique.  Rec Bike - L3 x 6 min  PHYSICAL PERFORMANCE TEST or MEASUREMENT: Berg Balance Test  Sit to Stand Able to stand without using hands and stabilize independently   Standing Unsupported Able to stand safely 2 minutes   Sitting with Back Unsupported but Feet  Supported on Floor or Stool Able to sit safely and securely 2 minutes   Stand to Sit Sits safely with minimal use of hands   Transfers Able to transfer safely, definite need of hands   Standing Unsupported with Eyes Closed Able to stand 3 seconds   Standing Unsupported with Feet Together Needs help to attain position but able to stand for 30 seconds with feet together   From Standing, Reach Forward with Outstretched Arm Can reach forward >12 cm safely (5)   From Standing Position, Pick up Object from Floor Able to pick up shoe, needs supervision   From Standing Position, Turn to Look Behind Over each Shoulder Turn sideways only but maintains balance   Turn 360 Degrees Needs close supervision or verbal cueing   Standing Unsupported, Alternately Place Feet on Step/Stool Able to complete >2 steps/needs minimal assist   Standing Unsupported, One Foot in Front Able to take small step independently and hold 30 seconds   Standing on One Leg Tries to lift leg/unable to hold 3 seconds but remains standing independently   Total Score 35   Berg comment: <36 = High risk for falls (close to 100%)       GAIT TRAINING: To normalize gait pattern,  improve safety with hiking poles, and prepare to wean to LRAD.  400' with B hiking poles on level surfaces indoors with CGA/SBA of PT via gait belt - cues for placement of hiking pole more to front to improve upright alignment for better stability and avoid pole sliding out to rear   09/27/23 THERAPEUTIC EXERCISE: To improve strength and endurance.  Demonstration, verbal and tactile cues throughout for technique. Bike L3x17min Leg extension 30lb 2x15 BLE Leg curls 40lb 2x15 BLE  NEUROMUSCULAR RE-EDUCATION: To improve balance, coordination, kinesthesia, posture, proprioception, reduce fall risk, and amplitude of movement.  Quadruped rock fwd and back 2x10 Quadruped fire hydrants 2x10 B Quadruped hip extension x 10 B Standing UE support using orange pball:  Hip  abduction x 15 B  Hip extension 2x10 B  Modified bird dog x5 with RLE/LUE; unable with reciprocal side Leg extension 30lb 2x15 BLE   PATIENT EDUCATION:  Education details: HEP review and HEP update  Person educated: Patient Education method: Explanation, Demonstration, Verbal cues, Handouts, and MedBridgeGO app access provided Education comprehension: verbalized understanding, returned demonstration, and verbal cues required  HOME EXERCISE PROGRAM: Access Code: [email or web address] URL: [email or web address] Date: 10/14/2023 Prepared by: Aldo Sondgeroth  Exercises - Seated Hamstring Stretch  - 1-2 x daily - 7 x weekly - 3 reps - 30 sec hold - Seated Table Piriformis Stretch  - 1-2 x daily - 7 x weekly - 3 reps - 30 sec hold - Seated Hip Flexor Stretch  - 1-2 x daily - 7 x weekly - 3 reps - 30 sec hold - Heel Raises with Counter Support  - 1 x daily - 7 x weekly - 3 sets - 10 reps - Standing Hip Abduction with Counter Support  - 1 x daily - 7 x weekly - 3 sets - 10 reps - Standing Hip Extension with Counter Support  - 1 x daily - 7 x weekly - 3 sets - 10 reps - Standing March with Counter Support  - 1 x daily - 7 x weekly - 3 sets - 10 reps - Seated Ankle Plantarflexion with Resistance  - 1 x daily - 7 x weekly - 2-3 sets - 10 reps - Single Leg Balance with Clock Reach  - 1 x daily - 7 x weekly - 2 sets - 10 reps - 3 sec hold - Lunge with Counter Support  - 1 x daily - 7 x weekly - 2 sets - 10 reps - Side Lunge with Counter Support  - 1 x daily - 7 x weekly - 2 sets - 10 reps  Patient Education - Check for Safety   ASSESSMENT:  CLINICAL IMPRESSION: Reviewed HEP and worked on safe balance activities for patient to continue to work on at home. We worked on core strengthening as well. Will continue to review and finalize HEP over remaining visits with anticipated transition to HEP at end of current POC.   OBJECTIVE IMPAIRMENTS: Abnormal gait, decreased activity tolerance,  decreased balance, decreased coordination, decreased endurance, decreased knowledge of use of DME, decreased mobility, difficulty walking, decreased ROM, decreased strength, decreased safety awareness, impaired perceived functional ability, impaired flexibility, impaired sensation, improper body mechanics, and postural dysfunction.   ACTIVITY LIMITATIONS: carrying, lifting, bending, standing, squatting, stairs, transfers, bathing, dressing, reach over head, locomotion level, and caring for others  PARTICIPATION LIMITATIONS: meal prep, cleaning, laundry, driving, shopping, community activity, and yard work  PERSONAL FACTORS: Age, Past/current experiences, Time since onset of injury/illness/exacerbation, and 3+ comorbidities: Chronic spinal cerebellar  ataxia, neuropathy, osteoporosis, R sided pelvic fracture s/p fall 05/2020, MI/CAD s/p CABG x 4, Aflutter, GERD, B shoulder surgery/RCR, L carpal tunnel release, OSA are also affecting patient's functional outcome.   REHAB POTENTIAL: Good  CLINICAL DECISION MAKING: Unstable/unpredictable  EVALUATION COMPLEXITY: High   GOALS: Goals reviewed with patient? Yes  SHORT TERM GOALS: Target date: 08/09/2023  Patient will be independent with initial HEP to improve outcomes and carryover.  Baseline:  Goal status: MET - 08/05/23  2.  Patient will be educated on strategies to decrease risk of falls.  Baseline:  Goal status: MET - 08/03/23 - Educated on Fall Prevention  3.  Patient will demonstrate decreased 5xSTS time to </= 14.8 sec to decrease risk for falls with transitional mobility. Baseline: 16.47 sec Goal status: MET - 08/10/23 - 14.09 sec  LONG TERM GOALS: Target date: 09/06/2023, extended to 10/18/2023   Patient will be independent with advanced/ongoing HEP to facilitate ability to maintain/progress functional gains from skilled physical therapy services. Baseline:  Goal status: IN PROGRESS - 10/07/23 - met for current HEP  2.  Patient will be  able to ambulate 600' with LRAD on variable surfaces with good safety to access community.  Baseline:  Goal status: IN PROGRESS - 09/16/23 - Able to ambulate 600+' with RW over variable surfaces indoors and outdoors with CGA/SBA of PT- pt with tendency to reach more fwd with RW when lifting it over thresholds (elevator or doorway) and entrance rugs or walking up inclines but no LOB  3.  Patient will be able to step up/down curb safely with LRAD for safety with community ambulation.  Baseline:  Goal status: IN PROGRESS - 09/16/23 - Able to ascend/descend curb with RW with SBA of PT   4.  Patient will demonstrate improved B LE strength to >/= 4+/5 for improved stability and ease of mobility . Baseline: Refer to above LE MMT table Goal status: MET - 09/09/23  5.  Patient will improve TUG time to </= 15 seconds with walking sticks or LRAD for improved efficiency and safety with transfers. Baseline: 20.63 sec with RW Goal status: MET - 08/30/23 - 13.0 sec with RW  6.  Patient will demonstrate gait speed of >/= 1.8 ft/sec (0.55 m/s) with walking sticks or LRAD to be a safe limited community ambulator with decreased risk for recurrent falls.  Baseline: 1.00 ft/sec Goal status: PARTIALLY MET - 08/30/23 - 2.70 ft/sec with RW  7.  Patient will improve Berg score by at least 8 points to improve safety and stability with ADLs in standing and reduce risk for falls. (MCID= 8 points)  Baseline: 26/56 (07/19/23) 08/30/23 - 32/56 Goal status: MET - 09/30/23 - 35/56  8.  Patient will demonstrate at least 3 point improvement on DGI to improve gait stability and reduce risk for falls. Baseline: 10/24 (07/19/23) 08/30/23 - 13/24 - Goal met Goal status: MET - 10/07/23 - 16/24  9. Patient will report >/= 40% on ABC scale to demonstrate improved balance confidence with functional mobility and gait. Baseline: 360 / 1600 = 22.5 % 08/30/23 - 570 / 1600 = 35.6 % Goal status: MET - 10/07/23 - 1080 / 1600 = 67.5 %   PLAN:  PT  FREQUENCY: 2x/week  PT DURATION: 6-7 weeks  PLANNED INTERVENTIONS: 97164- PT Re-evaluation, 97110-Therapeutic exercises, 97530- Therapeutic activity, 97112- Neuromuscular re-education, 97535- Self Care, 16109- Manual therapy, [phone]- Gait training, Patient/Family education, Balance training, Stair training, Taping, Dry Needling, DME instructions, Cryotherapy, and Moist heat  PLAN  FOR NEXT SESSION: Reassess goals; Review, update and/or consolidate HEP as indicated in preparation for anticipated upcoming transition to HEP at end of current POC; LE strengthening and static/dynamic balance stepping/weight shifting exercises; perturbations in standing; gait training with hiking poles   Samuella Crocker, PTA 10/14/2023, 2:48 PM

## 2023-10-18 ENCOUNTER — Ambulatory Visit: Admitting: Physical Therapy

## 2023-10-18 ENCOUNTER — Encounter: Payer: Self-pay | Admitting: Physical Therapy

## 2023-10-18 DIAGNOSIS — M6281 Muscle weakness (generalized): Secondary | ICD-10-CM

## 2023-10-18 DIAGNOSIS — R2681 Unsteadiness on feet: Secondary | ICD-10-CM

## 2023-10-18 DIAGNOSIS — R260 Ataxic gait: Secondary | ICD-10-CM

## 2023-10-18 NOTE — Therapy (Signed)
 OUTPATIENT PHYSICAL THERAPY TREATMENT / DISCHARGE SUMMARY   Patient Name: Andre [name] MRN: [id number] DOB:1940/12/21, 83 y.o., male Today's Date: 10/18/2023   END OF SESSION:  PT End of Session - 10/18/23 1402     Visit Number 22    Date for PT Re-Evaluation 10/18/23    Authorization Type [employer]    Progress Note Due on Visit 29   PN on visit #19 - 10/07/23   PT Start Time 1402    PT Stop Time 1435    PT Time Calculation (min) 33 min    Activity Tolerance Patient tolerated treatment well    Behavior During Therapy WFL for tasks assessed/performed                    Past Medical History:  Diagnosis Date   Carpal tunnel syndrome    Cerebellar ataxia (HCC)    spinal cerebellar ataxia   Dyslipidemia    H/O rotator cuff surgery    Obstructive sleep apnea    Past Surgical History:  Procedure Laterality Date   BYPASS GRAFT AORTA TO AORTA     quadruple bypass   CARPAL TUNNEL RELEASE Left    EYE SURGERY Bilateral    SHOULDER SURGERY     bilateral   TONSILLECTOMY     VASECTOMY     Patient Active Problem List   Diagnosis Date Noted   Osteoporosis, senile 08/27/2022   Neuropathy 12/12/2018   Cerebellar ataxia (HCC) 12/12/2018   Vitreous membranes and strands 10/20/2018   Bilateral cold feet 04/07/2017   Osteopenia of multiple sites 04/07/2017   Vitamin D  deficiency 04/07/2017   CAD in native artery 03/09/2017   Old MI (myocardial infarction) 03/09/2017   Carpal tunnel syndrome, 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   Ulnar neuropathy at elbow, 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   PVC's (premature ventricular contractions) 10/09/2015   Chronic dryness of both eyes 09/26/2015   Hypermetropia 09/26/2015   Presbyopia 09/26/2015   Regular astigmatism 09/26/2015   Hypermetropia of both eyes 09/26/2015   Senile nuclear sclerosis 09/26/2015   Stationary peripheral pterygium of right eye 09/26/2015   Unspecified esotropia 09/26/2015   Ventral hernia without obstruction or gangrene  07/30/2015   Dyslipidemia 05/09/2015   OSA (obstructive sleep apnea) 05/09/2015   S/P CABG (coronary artery bypass graft) 05/09/2015   Unspecified atrial flutter (HCC) 05/09/2015    PCP: Millicent Sharper, MD   REFERRING PROVIDER: Millicent Sharper, MD   REFERRING DIAG:  R26.81 (ICD-10-CM) - Unsteadiness on feet R26.89 (ICD-10-CM) - Other abnormalities of gait and mobility  THERAPY DIAG:  No diagnosis found.  RATIONALE FOR EVALUATION AND TREATMENT: Rehabilitation  ONSET DATE: Chronic  NEXT MD VISIT: 12/16/23   SUBJECTIVE:  SUBJECTIVE STATEMENT: Pt reports he has noted some intermittent numbness in his R hand recently, with ache in thenar eminence while in bed at night.  He states he has an appointment to be checked out for carpal tunnel syndrome.  Eval: Pt reports how he feels depends on how much sleep he gets.  Arrives to PT with RW but states he also has a 4WW and hiking poles which he uses intermixed. His wife feels like he has been more unsteady of late.  He notes some R knee pain a few months back but now resolved.  Works with Systems analyst 2x/wk - STS, dumbbells, planks, etc.  Sees a chiropractor for his back 1x/month.  Pt accompanied by: self  PAIN: Are you having pain? No  PERTINENT HISTORY:  Chronic spinal cerebellar ataxia, neuropathy, osteoporosis, R sided pelvic fracture s/p fall 05/2020, MI/CAD s/p CABG x 4, Aflutter, GERD, B shoulder surgery/RCR, L carpal tunnel release, OSA  PRECAUTIONS: Fall  RED FLAGS: None  WEIGHT BEARING RESTRICTIONS: No  FALLS:  Has patient fallen in last 6 months? No  LIVING ENVIRONMENT: Lives with: lives with their family Lives in: House/apartment Stairs: Yes: Internal: 14 steps; on right going up and on left going up and External: 1  or 3 steps; on right going up and on left going up Has following equipment at home: Vannie - 2 wheeled, Environmental consultant - 4 wheeled, shower chair, Grab bars, and hiking poles  OCCUPATION: Retired  PLOF: Independent, Independent with community mobility with device, and Leisure: community outings with friends, walking loops in driveway, driving with son (adult CP); works with Systems analyst 2x/wk - STS, dumbbells, planks, etc   PATIENT GOALS: To feel more confident walking with the walking sticks.   OBJECTIVE: (objective measures completed at initial evaluation unless otherwise dated)  DIAGNOSTIC FINDINGS:  06/22/23 - XR L toes (Left great toe skin breakdown) IMPRESSION:  Mild soft tissue swelling. No radiographic evidence of osteomyelitis.  Sonogram negative for DVT  COGNITION: Overall cognitive status: Within functional limits for tasks assessed   SENSATION: B LE neuropathy - predominantly in B feet  COORDINATION: Gross motor mild impairment B LE  EDEMA:  Intermittent distal LE edema  POSTURE:  No Significant postural limitations  MUSCLE LENGTH: Hamstrings: severe tight B ITB: mod tight L>R Piriformis: mod/severe tight B Hip flexors: mod tight B Quads: mild tight B Heelcord:   LOWER EXTREMITY ROM:    Grossly WFL other than restrictions related to above muscle tightness  LOWER EXTREMITY MMT:    MMT Right eval Left eval R 08/10/23 L 08/10/23 R 09/09/23 L 09/09/23 R 10/18/23 L 10/18/23  Hip flexion 4+ 4 5 5 5 5 5 5   Hip extension 4 4 4+ 4+ 4+ 4+ 5 5  Hip abduction 4 4 4 4  4+ 4+ 4+ 4+  Hip adduction 4+ 4+ 4+ 5 4+ 4+ 5 5  Hip internal rotation 4+ 4+ 5 5 5 5 5 5   Hip external rotation 4 4 4+ 4+ 5 4+ 5 5  Knee flexion 5 5 5 5 5  4+ 5 5  Knee extension 5 5 5 5 5 5 5 5   Ankle dorsiflexion 4+ 4 5 4+   5 5  Ankle plantarflexion          Ankle inversion          Ankle eversion          (Blank rows = not tested)  BED MOBILITY:  Independent  TRANSFERS: Assistive device utilized:  Walker - 2 wheeled  Sit to stand: SBA Stand to sit: SBA Chair to chair:   Floor:    GAIT: Distance walked: clinic distances Assistive device utilized: Environmental consultant - 2 wheeled Level of assistance: Modified independence and SBA Gait pattern: step to pattern, decreased hip/knee flexion- Right, decreased hip/knee flexion- Left, ataxic, and trunk flexed Comments:   RAMP: Level of Assistance: NT Assistive device utilized:   Ramp Comments:   CURB: (09/16/23) Level of Assistance: SBA Assistive device utilized: Environmental consultant - 2 wheeled Curb Comments:   STAIRS:  Level of Assistance: NT  Stair Negotiation Technique:   Number of Stairs:    Height of Stairs:   Comments:   FUNCTIONAL TESTS:  5 times sit to stand: 16.47 sec w/o UE assist Timed up and go (TUG): 20.63 sec with RW 10 meter walk test: 32.68 sec with RW; 1.00 ft/sec with RW  Berg Balance Scale: 26/56; Scores of <36 indicate high risk for falls (close to 100%) - 07/19/23 Dynamic Gait Index: 10/24; Scores of 19 or less are predictive of falls in older community living adults - 07/19/23 MCTSIB: Condition 1: Avg of 3 trials: 30 sec, Condition 2: Avg of 3 trials: 8.67 sec, Condition 3: Avg of 3 trials: 30 sec, Condition 4: Avg of 3 trials: 4 sec, and Total Score: 72.67/120  08/10/23: 5xSTS = 14.09 sec  08/30/23: TUG = 13.0 sec, >13.5 sec is predictive of falls = 12.16 sec Gait speed = 2.70 ft/sec with RW; >2.62 ft/sec indicates community ambulator Berg = 32/56, <36 = High risk for falls (close to 100%)  DGI = 13/24, scores of 19 or less are predictive of falls in older community living adults  09/30/23:  Lars = 35/56, <36 = High risk for falls (close to 100%)   PATIENT SURVEYS:  ABC scale 360 / 1600 = 22.5 %; <50% indicates low level of physical functioning and risk for recurrent falls 08/30/23 - 08/30/23 - 570 / 1600 = 35.6 %   TODAY'S TREATMENT:   10/18/23 THERAPEUTIC EXERCISE: To improve strength and endurance.  Demonstration,  verbal and tactile cues throughout for technique.  Rec Bike - L4 x 6 min  THERAPEUTIC ACTIVITIES: To improve functional performance.  Demonstration, verbal and tactile cues throughout for technique. = 10.03 sec with RW Gait speed = 3.27 ft/sec with RW Goal assessment  GAIT TRAINING: To normalize gait pattern and improve safety with RW. 600' with RW indoors and outdoors including curb negotiation with modified independence   10/14/23 THERAPEUTIC EXERCISE: To improve strength and endurance.  Demonstration, verbal and tactile cues throughout for technique. Bike L4x4min Trunk rotation w/   NEUROMUSCULAR RE-EDUCATION: To improve balance, coordination, kinesthesia, posture, proprioception, and reduce fall risk. Standing 7-way clock reaches with toe tap while holding onto wall ladder x 5 bil Standing fwd step then lunge 2x10 B Standing lateral step the lunge 2x10 B Standing alternating pivot step and reach while holding onto wall ladder x 10 bil B heel-raises while holding onto wall ladder 2 x 10 bil - fatigued after ~8 reps B toe-raises while holding onto wall ladder 2 x 10 bil - fatigued after ~8 reps Bridging straight leg orange pball 2x10   10/11/23 THERAPEUTIC EXERCISE: To improve strength and endurance.  Demonstration, verbal and tactile cues throughout for technique.  NuStep - L6 x 6 min - UE/LE  NEUROMUSCULAR RE-EDUCATION: To improve balance, coordination, kinesthesia, posture, proprioception, and reduce fall risk. Standing 7-way clock reaches with toe tap while holding onto  wall ladder x 5 bil Standing alternating pivot step and reach while holding onto wall ladder x 10 bil B heel-raises while holding onto wall ladder x 10 bil - fatigued after ~8 reps B toe-raises while holding onto wall ladder x 10 bil - fatigued after ~8 reps   10/07/23 THERAPEUTIC EXERCISE: To improve strength and endurance.  Demonstration, verbal and tactile cues throughout for technique. Rec Bike -  L4 x 6 min  PHYSICAL PERFORMANCE TEST or MEASUREMENT: ABC scale: 1080 / 1600 = 67.5 %, 50-80% = moderate level of physical functioning, <67% indicates risk of falls with vestibular disorders or older adults DGI = 16/24, Scores of 19 or less are predictive of falls in older community living adults  Dynamic Gait Index  Level Surface Mild Impairment   Change in Gait Speed Mild Impairment   Gait with Horizontal Head Turns Mild Impairment   Gait with Vertical Head Turns Mild Impairment   Gait and Pivot Turn Mild Impairment   Step Over Obstacle Mild Impairment   Step Around Obstacles Mild Impairment   Steps Mild Impairment   Total Score 16   DGI comment: Scores of 19 or less are predictive of falls in older community living adults       GAIT TRAINING: To normalize gait pattern and prepare to wean to LRAD.  450' with B hiking poles on level surfaces indoors with CGA/SBA of PT via gait belt - better reciprocal sequencing of hiking poles today, however some increased lateral sway/instability noted when distracted by his phone butt dialing his wife; he still demonstrates tendency to increase speed in order to correct for instability/LOB  NEUROMUSCULAR RE-EDUCATION: To improve balance, coordination, kinesthesia, posture, proprioception, reduce fall risk, and amplitude of movement.  SLS + GTB 4-way SLR x 10 bil, 1-2 UE support on wall ladder and/or RW with CGA/SBA of PT via gait belt - cues to slow pace for improved control of spasticity/ataxia and decreased reliance on momentum/rebound of Thera-Band   10/04/23 THERAPEUTIC EXERCISE: To improve strength and endurance.  Demonstration, verbal and tactile cues throughout for technique.  Rec Bike - L4 x 6 min Standing hip kicks and up/over x 10  NEUROMUSCULAR RE-EDUCATION: To improve balance, coordination, kinesthesia, posture, proprioception, reduce fall risk, and amplitude of movement.  Sidesteps along counter 3x- intermittent UE uspport Braiding  along counter 3x- fingertips support B Fwd step and reaching x 10 Posterior toe tap and reach x 10  Standing GTB B scap retraction + shoulder row x 20, BOS slightly wider than hip width Standing GTB B scap retraction + shoulder extension x 20, BOS slightly wider than hip width Standing GTB B shoulder ext + circles alongside B hips x 10- very challenging   09/30/23 THERAPEUTIC EXERCISE: To improve strength and endurance.  Demonstration, verbal and tactile cues throughout for technique.  Rec Bike - L3 x 6 min  PHYSICAL PERFORMANCE TEST or MEASUREMENT: Berg Balance Test  Sit to Stand Able to stand without using hands and stabilize independently   Standing Unsupported Able to stand safely 2 minutes   Sitting with Back Unsupported but Feet Supported on Floor or Stool Able to sit safely and securely 2 minutes   Stand to Sit Sits safely with minimal use of hands   Transfers Able to transfer safely, definite need of hands   Standing Unsupported with Eyes Closed Able to stand 3 seconds   Standing Unsupported with Feet Together Needs help to attain position but able to stand for 30 seconds  with feet together   From Standing, Reach Forward with Outstretched Arm Can reach forward >12 cm safely (5)   From Standing Position, Pick up Object from Floor Able to pick up shoe, needs supervision   From Standing Position, Turn to Look Behind Over each Shoulder Turn sideways only but maintains balance   Turn 360 Degrees Needs close supervision or verbal cueing   Standing Unsupported, Alternately Place Feet on Step/Stool Able to complete >2 steps/needs minimal assist   Standing Unsupported, One Foot in Front Able to take small step independently and hold 30 seconds   Standing on One Leg Tries to lift leg/unable to hold 3 seconds but remains standing independently   Total Score 35   Berg comment: <36 = High risk for falls (close to 100%)       GAIT TRAINING: To normalize gait pattern, improve safety with  hiking poles, and prepare to wean to LRAD.  400' with B hiking poles on level surfaces indoors with CGA/SBA of PT via gait belt - cues for placement of hiking pole more to front to improve upright alignment for better stability and avoid pole sliding out to rear   PATIENT EDUCATION:  Education details: recommended frequency for ongoing HEP at discharge to prevent loss of gains achieved with PT  Person educated: Patient Education method: Explanation Education comprehension: verbalized understanding  HOME EXERCISE PROGRAM: Access Code: [email or web address] URL: [email or web address] Date: 10/14/2023 Prepared by: Braylin Clark  Exercises - Seated Hamstring Stretch  - 1-2 x daily - 7 x weekly - 3 reps - 30 sec hold - Seated Table Piriformis Stretch  - 1-2 x daily - 7 x weekly - 3 reps - 30 sec hold - Seated Hip Flexor Stretch  - 1-2 x daily - 7 x weekly - 3 reps - 30 sec hold - Heel Raises with Counter Support  - 1 x daily - 7 x weekly - 3 sets - 10 reps - Standing Hip Abduction with Counter Support  - 1 x daily - 7 x weekly - 3 sets - 10 reps - Standing Hip Extension with Counter Support  - 1 x daily - 7 x weekly - 3 sets - 10 reps - Standing March with Counter Support  - 1 x daily - 7 x weekly - 3 sets - 10 reps - Seated Ankle Plantarflexion with Resistance  - 1 x daily - 7 x weekly - 2-3 sets - 10 reps - Single Leg Balance with Clock Reach  - 1 x daily - 7 x weekly - 2 sets - 10 reps - 3 sec hold - Lunge with Counter Support  - 1 x daily - 7 x weekly - 2 sets - 10 reps - Side Lunge with Counter Support  - 1 x daily - 7 x weekly - 2 sets - 10 reps  Patient Education - Check for Safety   ASSESSMENT:  CLINICAL IMPRESSION: Xane reports no concerns or issues with HEP review, modification and update completed last visit and feels comfortable with the current exercises as well as the recommended frequency for ongoing HEP performance.  He continues to rely on RW or 4WW for all  ambulation and is able to safely navigate all surfaces with either style of walker at gait speed of 3.27 ft/sec, consistent with community ambulation.  We did attempt to resume use of hiking/trekking poles but he was unable to achieve safe level of independence with the poles, therefore it was recommended that he continue to use the  RW or [id number].  All PT goals now met and Hyder feels ready to transition to his HEP, therefore will proceed with discharge from physical therapy for this episode.    OBJECTIVE IMPAIRMENTS: Abnormal gait, decreased activity tolerance, decreased balance, decreased coordination, decreased endurance, decreased knowledge of use of DME, decreased mobility, difficulty walking, decreased ROM, decreased strength, decreased safety awareness, impaired perceived functional ability, impaired flexibility, impaired sensation, improper body mechanics, and postural dysfunction.   ACTIVITY LIMITATIONS: carrying, lifting, bending, standing, squatting, stairs, transfers, bathing, dressing, reach over head, locomotion level, and caring for others  PARTICIPATION LIMITATIONS: meal prep, cleaning, laundry, driving, shopping, community activity, and yard work  PERSONAL FACTORS: Age, Past/current experiences, Time since onset of injury/illness/exacerbation, and 3+ comorbidities: Chronic spinal cerebellar ataxia, neuropathy, osteoporosis, R sided pelvic fracture s/p fall 05/2020, MI/CAD s/p CABG x 4, Aflutter, GERD, B shoulder surgery/RCR, L carpal tunnel release, OSA are also affecting patient's functional outcome.   REHAB POTENTIAL: Good  CLINICAL DECISION MAKING: Unstable/unpredictable  EVALUATION COMPLEXITY: High   GOALS: Goals reviewed with patient? Yes  SHORT TERM GOALS: Target date: 08/09/2023  Patient will be independent with initial HEP to improve outcomes and carryover.  Baseline:  Goal status: MET - 08/05/23  2.  Patient will be educated on strategies to decrease risk of falls.   Baseline:  Goal status: MET - 08/03/23 - Educated on Fall Prevention  3.  Patient will demonstrate decreased 5xSTS time to </= 14.8 sec to decrease risk for falls with transitional mobility. Baseline: 16.47 sec Goal status: MET - 08/10/23 - 14.09 sec  LONG TERM GOALS: Target date: 09/06/2023, extended to 10/18/2023   Patient will be independent with advanced/ongoing HEP to facilitate ability to maintain/progress functional gains from skilled physical therapy services. Baseline:  Goal status: MET - 10/18/23   2.  Patient will be able to ambulate 600' with LRAD on variable surfaces with good safety to access community.  Baseline:  09/16/23 - Able to ambulate 600+' with RW over variable surfaces indoors and outdoors with CGA/SBA of PT- pt with tendency to reach more fwd with RW when lifting it over thresholds (elevator or doorway) and entrance rugs or walking up inclines but no LOB Goal status: MET - 10/18/23  3.  Patient will be able to step up/down curb safely with LRAD for safety with community ambulation.  Baseline:  09/16/23 - Able to ascend/descend curb with RW with SBA of PT  Goal status: MET - 10/17/24  4.  Patient will demonstrate improved B LE strength to >/= 4+/5 for improved stability and ease of mobility . Baseline: Refer to above LE MMT table Goal status: MET - 09/09/23  5.  Patient will improve TUG time to </= 15 seconds with walking sticks or LRAD for improved efficiency and safety with transfers. Baseline: 20.63 sec with RW Goal status: MET - 08/30/23 - 13.0 sec with RW  6.  Patient will demonstrate gait speed of >/= 1.8 ft/sec (0.55 m/s) with walking sticks or LRAD to be a safe limited community ambulator with decreased risk for recurrent falls.  Baseline: 1.00 ft/sec 08/30/23 - 2.70 ft/sec with RW Goal status: MET - 10/17/24 - 3.27 ft/sec with RW  7.  Patient will improve Berg score by at least 8 points to improve safety and stability with ADLs in standing and reduce risk for  falls. (MCID= 8 points)  Baseline: 26/56 (07/19/23) 08/30/23 - 32/56 Goal status: MET - 09/30/23 - 35/56  8.  Patient will demonstrate at least  3 point improvement on DGI to improve gait stability and reduce risk for falls. Baseline: 10/24 (07/19/23) 08/30/23 - 13/24 - Goal met Goal status: MET - 10/07/23 - 16/24  9. Patient will report >/= 40% on ABC scale to demonstrate improved balance confidence with functional mobility and gait. Baseline: 360 / 1600 = 22.5 % 08/30/23 - 570 / 1600 = 35.6 % Goal status: MET - 10/07/23 - 1080 / 1600 = 67.5 %   PLAN:  PT FREQUENCY: 2x/week  PT DURATION: 6-7 weeks  PLANNED INTERVENTIONS: [id number]- PT Re-evaluation, 97110-Therapeutic exercises, 97530- Therapeutic activity, 97112- Neuromuscular re-education, 97535- Self Care, [id number]- Manual therapy, [phone]- Gait training, Patient/Family education, Balance training, Stair training, Taping, Dry Needling, DME instructions, Cryotherapy, and Moist heat  PLAN FOR NEXT SESSION:  Transition to HEP + D/C from PT   PHYSICAL THERAPY DISCHARGE SUMMARY  Visits from Start of Care: 22  Current functional level related to goals / functional outcomes: Refer to above clinical impression and goal assessment.   Remaining deficits: As above.   Education / Equipment: HEP; Fall prevention checklist for home   Patient agrees to discharge. Patient goals were met. Patient is being discharged due to meeting the stated rehab goals.    Elijah CHRISTELLA Hidden, PT 10/18/2023, 2:45 PM

## 2024-02-29 ENCOUNTER — Telehealth: Payer: Self-pay

## 2024-02-29 NOTE — Telephone Encounter (Signed)
 Received referral from Ozell Lenis MD at [facility]  [phone]) for OSA. Placed in sleep mailbox.

## 2024-03-07 ENCOUNTER — Encounter: Payer: Self-pay | Admitting: Pulmonary Disease

## 2024-04-06 ENCOUNTER — Ambulatory Visit (INDEPENDENT_AMBULATORY_CARE_PROVIDER_SITE_OTHER)

## 2024-04-06 VITALS — BP 158/77 | HR 68 | Temp 97.8°F | Resp 18 | Ht 71.0 in | Wt 222.4 lb

## 2024-04-06 DIAGNOSIS — M810 Age-related osteoporosis without current pathological fracture: Secondary | ICD-10-CM | POA: Diagnosis not present

## 2024-04-06 MED ORDER — DENOSUMAB 60 MG/ML ~~LOC~~ SOSY
60.0000 mg | PREFILLED_SYRINGE | Freq: Once | SUBCUTANEOUS | Status: AC
  Administered 2024-04-06: 60 mg via SUBCUTANEOUS

## 2024-04-06 NOTE — Progress Notes (Signed)
 Diagnosis: Osteoporosis  Provider:  Mannam, Praveen MD  Procedure: Injection  Prolia  (Denosumab ), Dose: 60 mg, Site: subcutaneous, Number of injections: 1  Injection Site(s): Left arm  Post Care: Patient declined observation  Discharge: Condition: Good, Destination: Home . AVS Declined  Performed by:  Eleanor DELENA Bloch, RN

## 2024-10-06 ENCOUNTER — Ambulatory Visit
